# Patient Record
Sex: Female | Born: 1980 | Race: Black or African American | Hispanic: No | Marital: Single | Smoking: Never smoker
Health system: Southern US, Community
[De-identification: ages and names within clinical notes are randomized; demographics above are authoritative.]

## PROBLEM LIST (undated history)

## (undated) DIAGNOSIS — E559 Vitamin D deficiency, unspecified: Secondary | ICD-10-CM

## (undated) DIAGNOSIS — D649 Anemia, unspecified: Secondary | ICD-10-CM

## (undated) DIAGNOSIS — M329 Systemic lupus erythematosus, unspecified: Secondary | ICD-10-CM

## (undated) DIAGNOSIS — IMO0002 Reserved for concepts with insufficient information to code with codable children: Secondary | ICD-10-CM

## (undated) DIAGNOSIS — M797 Fibromyalgia: Secondary | ICD-10-CM

## (undated) DIAGNOSIS — G43909 Migraine, unspecified, not intractable, without status migrainosus: Secondary | ICD-10-CM

## (undated) DIAGNOSIS — M1990 Unspecified osteoarthritis, unspecified site: Secondary | ICD-10-CM

## (undated) DIAGNOSIS — I10 Essential (primary) hypertension: Secondary | ICD-10-CM

## (undated) DIAGNOSIS — G629 Polyneuropathy, unspecified: Secondary | ICD-10-CM

## (undated) HISTORY — PX: TUBAL LIGATION: SHX77

## (undated) HISTORY — DX: Migraine, unspecified, not intractable, without status migrainosus: G43.909

## (undated) HISTORY — PX: DILATION AND CURETTAGE, DIAGNOSTIC / THERAPEUTIC: SUR384

## (undated) HISTORY — DX: Vitamin D deficiency, unspecified: E55.9

## (undated) HISTORY — PX: BARIATRIC SURGERY: SHX1103

---

## 2003-02-19 ENCOUNTER — Encounter: Admission: RE | Admit: 2003-02-19 | Discharge: 2003-02-19 | Payer: Self-pay | Admitting: Obstetrics and Gynecology

## 2003-07-14 ENCOUNTER — Encounter: Admission: RE | Admit: 2003-07-14 | Discharge: 2003-07-14 | Payer: Self-pay | Admitting: Obstetrics and Gynecology

## 2003-07-20 ENCOUNTER — Ambulatory Visit (HOSPITAL_COMMUNITY): Admission: RE | Admit: 2003-07-20 | Discharge: 2003-07-20 | Payer: Self-pay | Admitting: *Deleted

## 2003-09-09 ENCOUNTER — Emergency Department (HOSPITAL_COMMUNITY): Admission: EM | Admit: 2003-09-09 | Discharge: 2003-09-09 | Payer: Self-pay | Admitting: Emergency Medicine

## 2004-01-06 ENCOUNTER — Ambulatory Visit (HOSPITAL_COMMUNITY): Admission: AD | Admit: 2004-01-06 | Discharge: 2004-01-07 | Payer: Self-pay | Admitting: Obstetrics and Gynecology

## 2004-01-07 ENCOUNTER — Encounter (INDEPENDENT_AMBULATORY_CARE_PROVIDER_SITE_OTHER): Payer: Self-pay | Admitting: *Deleted

## 2004-02-10 ENCOUNTER — Inpatient Hospital Stay (HOSPITAL_COMMUNITY): Admission: AD | Admit: 2004-02-10 | Discharge: 2004-02-10 | Payer: Self-pay | Admitting: Obstetrics & Gynecology

## 2004-02-11 ENCOUNTER — Ambulatory Visit: Payer: Self-pay | Admitting: Obstetrics and Gynecology

## 2004-10-26 ENCOUNTER — Emergency Department: Payer: Self-pay | Admitting: Unknown Physician Specialty

## 2005-05-18 ENCOUNTER — Ambulatory Visit: Payer: Self-pay | Admitting: Obstetrics and Gynecology

## 2005-05-18 ENCOUNTER — Encounter: Payer: Self-pay | Admitting: Obstetrics and Gynecology

## 2005-08-28 ENCOUNTER — Emergency Department: Payer: Self-pay | Admitting: Emergency Medicine

## 2006-01-05 ENCOUNTER — Emergency Department: Payer: Self-pay | Admitting: Emergency Medicine

## 2006-02-21 ENCOUNTER — Ambulatory Visit: Payer: Self-pay | Admitting: Obstetrics & Gynecology

## 2007-03-06 ENCOUNTER — Emergency Department: Payer: Self-pay | Admitting: Emergency Medicine

## 2007-04-18 ENCOUNTER — Ambulatory Visit: Payer: Self-pay | Admitting: Family Medicine

## 2007-04-18 ENCOUNTER — Encounter: Payer: Self-pay | Admitting: Physician Assistant

## 2007-05-22 ENCOUNTER — Ambulatory Visit: Payer: Self-pay | Admitting: Family Medicine

## 2007-06-12 ENCOUNTER — Emergency Department: Payer: Self-pay | Admitting: Emergency Medicine

## 2008-02-22 ENCOUNTER — Emergency Department (HOSPITAL_BASED_OUTPATIENT_CLINIC_OR_DEPARTMENT_OTHER): Admission: EM | Admit: 2008-02-22 | Discharge: 2008-02-22 | Payer: Self-pay | Admitting: Emergency Medicine

## 2008-04-20 ENCOUNTER — Emergency Department (HOSPITAL_COMMUNITY): Admission: EM | Admit: 2008-04-20 | Discharge: 2008-04-20 | Payer: Self-pay | Admitting: Emergency Medicine

## 2010-02-22 ENCOUNTER — Emergency Department: Payer: Self-pay | Admitting: Emergency Medicine

## 2010-05-20 NOTE — Op Note (Signed)
Connie Whitney, Connie Whitney             ACCOUNT NO.:  [id number]   MEDICAL RECORD NO.:  [id number]          PATIENT TYPE:  MAT   LOCATION:  [facility]                          FACILITY:  [facility]   PHYSICIAN:  Malva Limes, M.D.    DATE OF BIRTH:  21-Mar-1980   DATE OF PROCEDURE:  01/07/2004  DATE OF DISCHARGE:                                 OPERATIVE REPORT   PREOPERATIVE DIAGNOSIS:  Incomplete abortion.   POSTOPERATIVE DIAGNOSIS:  Incomplete abortion.   OPERATION/PROCEDURE:  Dilatation and curettage.   SURGEON:  Malva Limes, M.D.   ANESTHESIA:  MAC with paracervical block.   ANTIBIOTICS:  Ancef 1 g.   DRAINS:  None.   SPECIMENS:  Products of conception sent to pathology.   COMPLICATIONS:  None.   DESCRIPTION OF PROCEDURE:  The patient was taken to the operating room where  she was placed on the dorsal lithotomy position.  She was prepped with  Hibiclens.  She was draped in the usual fashion for the procedure and 20 mL  of 1% lidocaine was used for paracervical block.  The cervix was grasped  with a single-tooth tenaculum.  The os was dilated to a 29-French.  An 8 mm  suction cannula was then placed into the uterine cavity.  Products of  conception were then withdrawn.  Sharp curettage was then performed followed  by repeat suction.  The patient tolerated the procedure well and she was  taken to the recovery room in stable condition.  Blood type was RH positive  and, therefore, no RhoGAM was indicated.  The patient will be discharged to  home with Keflex 500 mg q.i.d. for two days and Anaprox double strength  p.r.n.  She will follow up in the office in four weeks.     Mark   MA/MEDQ  D:  01/07/2004  T:  01/07/2004  Job:  [id number]

## 2010-05-20 NOTE — Group Therapy Note (Signed)
NAMEPHILENA, OBEY             ACCOUNT NO.:  [id number]   MEDICAL RECORD NO.:  [id number]          PATIENT TYPE:  WOC   LOCATION:  [facility]                   FACILITY:  [facility]   PHYSICIAN:  Argentina Donovan, MD        DATE OF BIRTH:  06-Aug-1980   DATE OF SERVICE:                                    CLINIC NOTE   This patient is a 30 year old, African-American hypertensive nulligravida  female who weighs 258 and is 5 feet 8 inches tall, who came in requesting an  IUD.  We discussed both types of IUDs, and which she wanted of these, and  she has chosen the Mirena because of the shorter periods and the lack of  cramping.  She is a hypertensive, with a blood pressure today of 160/109.  She is on hydrochlorothiazide, which she has not taken in a month because  her prescription ran out.  Pap smear was taken today.  After going over the  possible complications related to the IUD, the patient was placed in the  dorsal lithotomy position.  A Graves speculum was placed in the vagina.  The  cervix was easily visualized.  A Pap smear was taken.  The uterine cavity  which was anterior was sounded to 8 cm.  The Mirena IUD was inserted without  incident.  The patient has been instructed to come back if has any  significant bleeding or if the string appears to be too long and bothering  her.  She is also going to check for the first couple months if she feels  the string after her period.  The uterus was of normal size, shape, and  consistency.  Adnexa could not be palpated.  External genitalia was normal.  BUS was within normal limits.  The vagina is clean and well rugated.  The  cervix is clean, nulliparous.           ______________________________  Argentina Donovan, MD     PR/MEDQ  D:  05/18/2005  T:  05/19/2005  Job:  [id number]

## 2010-05-20 NOTE — Group Therapy Note (Signed)
NAMEHAYVEN, Connie Whitney                         ACCOUNT NO.:  [id number]   MEDICAL RECORD NO.:  [id number]                   PATIENT TYPE:  OUT   LOCATION:  [facility]                           FACILITY:  [facility]   PHYSICIAN:  Argentina Donovan, MD                     DATE OF BIRTH:  January 11, 1980   DATE OF SERVICE:  07/14/2003                                    CLINIC NOTE   REASON FOR VISIT:  This is a 30 year old nulligravida black female who was  seen in February because of polymenorrhea, had been on Depro-Provera for one-  and-a-half years up until then, and had irregular menses through the entire  time.  Since she had stopped it she was continuing to have dysfunctional  bleeding and was placed on a generic Ortho-Novum 1/35.  Over the past few  months she has been taking it continually and avoiding the placebo pills at  the end and has spotted or bled every day so that she uses between one and  three pads each day.  She also complains of sharp pains in the right lower  quadrant which come and go.  We are unable to evaluate the pelvis very well  because the patient does weigh 258 pounds.  We are going to go ahead and get  an ultrasound to check the right adnexal pain and also to evaluate the  uterine stripe, then I think a decision will come on whether we need to do a  D&C on this patient or have her cycle on the Ortho-Novum 1/50.  In addition,  the patient today has a slightly elevated blood pressure of 155/92.  It was  also elevated last time so we are going to send her over to the medical  clinic for control of blood pressure.   IMPRESSION:  1. Dysfunctional uterine bleeding, seemingly intractable at this point.  2. Hypertension.                                               Argentina Donovan, MD    PR/MEDQ  D:  07/14/2003  T:  07/14/2003  Job:  [id number]

## 2010-05-20 NOTE — Group Therapy Note (Signed)
Connie Whitney, Connie Whitney             ACCOUNT NO.:  [id number]   MEDICAL RECORD NO.:  [id number]          PATIENT TYPE:  WOC   LOCATION:  [facility]                   FACILITY:  [facility]   PHYSICIAN:  Argentina Donovan, MD        DATE OF BIRTH:  1980-10-25   DATE OF SERVICE:  02/11/2004                                    CLINIC NOTE   The patient is a 30 year old black female who was on oral contraceptives for  dysfunctional bleeding.  She weighs 259 pounds.  She was on Ortho-Novum 1/50  and at the end of last year she fell down the stairs and broke both her  legs.  She was on antibiotics as well as medications which may have  interfered, although she says she took a pill every day and somehow got  pregnant around November of 2005.  She miscarried in January of 2005 and had  a D&C for an incomplete abortion.  She comes in today with her first period  since the [facility] for her annual examination.  She is, right now, seeming in  good health, although her blood pressure is up.  She stopped taking her  hydrochlorothiazide which we will restart.  We must control her  dysfunctional bleeding.  We will try her on __________  and see if that  works for her.  If not, we may have to revert back to the Ortho-Novum 1/50.  Meanwhile, we will put her back on her hydrochlorothiazide and __________  her blood pressure.   IMPRESSION:  1.  Morbid obesity.  2.  Hypertension.  3.  Dysfunctional uterine bleeding.      PR/MEDQ  D:  02/11/2004  T:  02/12/2004  Job:  [id number]

## 2010-05-20 NOTE — Group Therapy Note (Signed)
Connie Whitney, BORA             ACCOUNT NO.:  [id number]   MEDICAL RECORD NO.:  [id number]          PATIENT TYPE:  WOC   LOCATION:  [facility]                   FACILITY:  [facility]   PHYSICIAN:  Dorthula Perfect, MD     DATE OF BIRTH:  09-May-1980   DATE OF SERVICE:  02/21/2006                                  CLINIC NOTE   A 30 year old black female, non gravida, is here for an IUD removal.  She had been seen here for the past few years.  Her initial blood  pressure back in 2005 was 155/92.  She has been on birth control pills  to help with dysfunctional bleeding.  Her blood pressure has remained  basically the same.  The birth control pills were discontinued about 10  months ago, and a Mirena IUD was inserted.  Blood pressure has continued  to remain in the same area.  She is followed in the medical clinic, now  on medication, and the medical doctors wonder if the IUD should be  removed, as it might be contributing to her blood pressure.   The patient has vaginal spotting almost every day since the IUD was  inserted, and states that this has been going on pretty much for the  past year.  For that reason, she wants the IUD removed.  She is  interested in the NuvaRing.   The patient states she did not eat very much.  She also still drinks  regular sodas, eats potatoes, etc.  There is a strong family history of  hypertension.   PHYSICAL EXAMINATION:  Height 5 feet 8 inches, weight 260.  Blood  pressure 158/108.  ABDOMEN:  Obese, soft, nontender, no masses.  PELVIC EXAM:  External genitalia, BUS glands are normal, vaginal vault  __________ , as was cervix.  At the conclusion of the exam, the IUD was  removed.  On bimanual exam, the uterus was of normal size and shape.  Adnexal structures were normal.   IMPRESSION:  1. Hypertension.  2. Breakthrough bleeding, secondary to the intrauterine device.   DISPOSITION:  1. Intrauterine device is removed.  2. NuvaRing, with a prescription  for a year.  3. The patient is advised to switch to unsweetened drinks and avoid      the bad starches such as potatoes in any form, etc.   If she continues to have irregular bleeding after using the NuvaRing for  3 months, then she needs to return for further evaluation.          ______________________________  Dorthula Perfect, MD    ER/MEDQ  D:  02/21/2006  T:  02/21/2006  Job:  [id number]

## 2010-05-20 NOTE — Group Therapy Note (Signed)
NAMEARLISHA, Whitney                         ACCOUNT NO.:  [id number]   MEDICAL RECORD NO.:  [id number]                   PATIENT TYPE:  OUT   LOCATION:  [facility]                           FACILITY:  [facility]   PHYSICIAN:  Argentina Donovan, MD                     DATE OF BIRTH:  24-Jul-1980   DATE OF SERVICE:  02/19/2003                                    CLINIC NOTE   CHIEF COMPLAINT:  Abnormal bleeding.   SUBJECTIVE:  Connie Whitney is a 30 year old G0 who is here today for  polymenorrhea.  She states she had been on Depo-Provera for the last one-and-  a-half years.  She had had irregular menses throughout the entire time.  Her  last Depo injection was supposed to be on October 18 and she did not make  that appointment because she no longer wanted to be on Depo.  Since then she  has had bleeding every day.  She states the bleeding is light and uses  between one to four pads per day.  No orthostasis or dizziness.  During the  time that she was taking the Depo she had been tried on estrogen for a few  months which caused her bleeding to cease.  She also has been on OCPs in the  past and started at age 24.  Some of them caused her to be nauseated and  some physicians were concerned with her blood pressure being elevated and  took her off of the pills.   Elevated blood pressure:  She had been on diuretics for about 6 months and  she has now been off for about a year.  She was taken off of them initially  because it was thought that her hypertension was due to stress at work.  She  was taken off when she quit that job.  She currently is working for [employer].  She denies any chest pain or vision changes.  She states her  cholesterol was checked in the past and is normal.  She is not a smoker.   GYNECOLOGICAL HISTORY:  She had abnormal Pap smear in July 2004 and never  underwent colposcopy secondary to her bleeding.  She states that ever since  she began obtaining Pap smears they have  been abnormal.  She has never been  biopsied in the past.  She is currently sexually active with three partners  and uses condoms.   SOCIAL HISTORY:  As above.   FAMILY HISTORY:  Her father has hypertension and a heart murmur that is  benign.   ALLERGIES:  No known drug allergies.   OBJECTIVE:  Vital signs noted.  Today her blood pressure is 151/104.  In  general she is an obese female in no acute distress.  Skin exam shows no  hirsutism.  Pelvic exam reveals normal external female genitalia.  There is  blood at the introitus.  Vaginal walls  appear normal.  Cervix is within  normal limits and blood at the os.  There is no noted discharge.  Bimanual  exam reveals nonpregnant-size uterus with palpable adnexa bilaterally  although the exam is limited secondary to body habitus.   ASSESSMENT AND PLAN:  1. Polymenorrhea.  I suspect this patient is probably polycystic ovarian     syndrome.  I have checked the appropriate labs as well as a CBG to take     note of her blood glucose today.  I have placed her on Provera 10 mg     daily for 2 weeks and then transition to Ortho-Novum 1/35 one p.o. daily     as directed.  I have given her p.r.n. refills on the Ortho-Novum.  I told     her we will call her when her laboratory results are back if they are     abnormal.  2. Hypertension.  Since her blood pressure is elevated and she had been on     diuretics in the past I have placed her again on hydrochlorothiazide 25     mg daily with p.r.n. refills.  I checked a BMP today to take note of her     potassium and creatinine.  3. History of abnormal Pap smear.  A Pap smear has been done today.  If     abnormal she will need colposcopy.     Maurice March, MD                          Argentina Donovan, MD    LC/MEDQ  D:  02/19/2003  T:  02/20/2003  Job:  [id number]

## 2010-12-21 ENCOUNTER — Emergency Department: Payer: Self-pay | Admitting: Emergency Medicine

## 2011-10-08 ENCOUNTER — Emergency Department: Payer: Self-pay | Admitting: Unknown Physician Specialty

## 2011-10-08 LAB — CBC
HGB: 9.8 g/dL — ABNORMAL LOW (ref 12.0–16.0)
MCHC: 33.4 g/dL (ref 32.0–36.0)
Platelet: 366 10*3/uL (ref 150–440)
RDW: 15.7 % — ABNORMAL HIGH (ref 11.5–14.5)

## 2011-10-08 LAB — COMPREHENSIVE METABOLIC PANEL
Albumin: 3 g/dL — ABNORMAL LOW (ref 3.4–5.0)
Alkaline Phosphatase: 100 U/L (ref 50–136)
Anion Gap: 13 (ref 7–16)
BUN: 13 mg/dL (ref 7–18)
EGFR (African American): >60
Glucose: 61 mg/dL — ABNORMAL LOW (ref 65–99)
SGOT(AST): 10 U/L — ABNORMAL LOW (ref 15–37)

## 2011-10-08 LAB — URINALYSIS, COMPLETE
Blood: NEGATIVE
Nitrite: NEGATIVE
Protein: 75
Specific Gravity: 1.025 (ref 1.003–1.030)

## 2011-10-08 LAB — MAGNESIUM: Magnesium: 1.2 mg/dL — ABNORMAL LOW

## 2011-11-25 ENCOUNTER — Observation Stay: Payer: Self-pay | Admitting: Internal Medicine

## 2011-11-25 LAB — URINALYSIS, COMPLETE
Bacteria: NONE SEEN
Bilirubin,UR: NEGATIVE
Glucose,UR: NEGATIVE mg/dL (ref 0–75)
Ketone: NEGATIVE
Protein: NEGATIVE
RBC,UR: 1 /HPF (ref 0–5)
Specific Gravity: 1.017 (ref 1.003–1.030)
Squamous Epithelial: 4
WBC UR: 2 /HPF (ref 0–5)

## 2011-11-26 LAB — PIH PROFILE
Anion Gap: 12 (ref 7–16)
BUN: 9 mg/dL (ref 7–18)
Chloride: 106 mmol/L (ref 98–107)
Creatinine: 0.69 mg/dL (ref 0.60–1.30)
EGFR (Non-African Amer.): >60
Glucose: 99 mg/dL (ref 65–99)
HGB: 9.9 g/dL — ABNORMAL LOW (ref 12.0–16.0)
MCH: 26.5 pg (ref 26.0–34.0)
MCV: 79 fL — ABNORMAL LOW (ref 80–100)
Osmolality: 274 (ref 275–301)
Potassium: 3.1 mmol/L — ABNORMAL LOW (ref 3.5–5.1)
RBC: 3.72 10*6/uL — ABNORMAL LOW (ref 3.80–5.20)
WBC: 9.3 10*3/uL (ref 3.6–11.0)

## 2013-03-23 ENCOUNTER — Emergency Department: Payer: Self-pay | Admitting: Emergency Medicine

## 2013-03-23 LAB — CBC WITH DIFFERENTIAL/PLATELET
BASOS ABS: 0 10*3/uL (ref 0.0–0.1)
BASOS PCT: 0.2 %
EOS ABS: 0.1 10*3/uL (ref 0.0–0.7)
Eosinophil %: 0.9 %
HCT: 30.3 % — AB (ref 35.0–47.0)
HGB: 9.3 g/dL — ABNORMAL LOW (ref 12.0–16.0)
LYMPHS ABS: 2.6 10*3/uL (ref 1.0–3.6)
Lymphocyte %: 35.7 %
MCH: 21.1 pg — AB (ref 26.0–34.0)
MCHC: 30.8 g/dL — ABNORMAL LOW (ref 32.0–36.0)
MCV: 68 fL — AB (ref 80–100)
MONO ABS: 0.4 x10 3/mm (ref 0.2–0.9)
Monocyte %: 5.2 %
Neutrophil #: 4.2 10*3/uL (ref 1.4–6.5)
Neutrophil %: 58 %
Platelet: 520 10*3/uL — ABNORMAL HIGH (ref 150–440)
RBC: 4.43 10*6/uL (ref 3.80–5.20)
RDW: 17.8 % — ABNORMAL HIGH (ref 11.5–14.5)
WBC: 7.3 10*3/uL (ref 3.6–11.0)

## 2013-03-23 LAB — URINALYSIS, COMPLETE
BILIRUBIN, UR: NEGATIVE
BLOOD: NEGATIVE
Glucose,UR: NEGATIVE mg/dL (ref 0–75)
Ketone: NEGATIVE
NITRITE: NEGATIVE
PROTEIN: NEGATIVE
Ph: 6 (ref 4.5–8.0)
RBC,UR: 1 /HPF (ref 0–5)
SPECIFIC GRAVITY: 1.011 (ref 1.003–1.030)

## 2013-03-23 LAB — BASIC METABOLIC PANEL
Anion Gap: 5 — ABNORMAL LOW (ref 7–16)
BUN: 12 mg/dL (ref 7–18)
CALCIUM: 8.7 mg/dL (ref 8.5–10.1)
CHLORIDE: 105 mmol/L (ref 98–107)
CREATININE: 0.96 mg/dL (ref 0.60–1.30)
Co2: 26 mmol/L (ref 21–32)
EGFR (African American): >60
EGFR (Non-African Amer.): >60
GLUCOSE: 97 mg/dL (ref 65–99)
OSMOLALITY: 272 (ref 275–301)
POTASSIUM: 3 mmol/L — AB (ref 3.5–5.1)
SODIUM: 136 mmol/L (ref 136–145)

## 2013-03-23 LAB — TROPONIN I: Troponin-I: <0.02 ng/mL

## 2013-07-01 ENCOUNTER — Emergency Department (HOSPITAL_COMMUNITY)
Admission: EM | Admit: 2013-07-01 | Discharge: 2013-07-01 | Disposition: A | Discharge status: Home / Self Care | Payer: No Typology Code available for payment source | Attending: Emergency Medicine | Admitting: Emergency Medicine

## 2013-07-01 ENCOUNTER — Encounter (HOSPITAL_COMMUNITY): Payer: Self-pay | Admitting: Emergency Medicine

## 2013-07-01 DIAGNOSIS — S39012A Strain of muscle, fascia and tendon of lower back, initial encounter: Secondary | ICD-10-CM

## 2013-07-01 DIAGNOSIS — S335XXA Sprain of ligaments of lumbar spine, initial encounter: Secondary | ICD-10-CM | POA: Insufficient documentation

## 2013-07-01 DIAGNOSIS — Y9289 Other specified places as the place of occurrence of the external cause: Secondary | ICD-10-CM | POA: Insufficient documentation

## 2013-07-01 DIAGNOSIS — Y9389 Activity, other specified: Secondary | ICD-10-CM | POA: Insufficient documentation

## 2013-07-01 DIAGNOSIS — X58XXXA Exposure to other specified factors, initial encounter: Secondary | ICD-10-CM | POA: Insufficient documentation

## 2013-07-01 MED ORDER — IBUPROFEN 400 MG PO TABS
800.0000 mg | ORAL_TABLET | Freq: Once | ORAL | Status: AC
  Administered 2013-07-01: 800 mg via ORAL
  Filled 2013-07-01: qty 2

## 2013-07-01 MED ORDER — CYCLOBENZAPRINE HCL 10 MG PO TABS: 10.0000 mg | ORAL_TABLET | Freq: Three times a day (TID) | ORAL | Status: DC | PRN

## 2013-07-01 MED ORDER — IBUPROFEN 800 MG PO TABS: 800.0000 mg | ORAL_TABLET | Freq: Three times a day (TID) | ORAL | Status: DC | PRN

## 2013-07-01 NOTE — ED Notes (Addendum)
Pt trying to get out of the way of a large falling mirror, twisted neck and back. C/o neck and low back pain. Onset this am. Pt is driving herself today.

## 2013-07-01 NOTE — ED Provider Notes (Signed)
CSN: [id number]     Arrival date & time 07/01/13  0956 History  This chart was scribed for non-physician practitioner, Trixie DredgeEmily West, PA-C, working with Ethelda ChickMartha K Linker, MD by Shari HeritageAisha Amuda, ED Scribe. This patient was seen in room TR08C/TR08C and the patient's care was started at 11:23 AM.   Chief Complaint  Patient presents with  . Back Pain     The history is provided by the patient. No language interpreter was used.    HPI Comments: Connie Spariffany C Whitney is a 33 y.o. female who presents to the Emergency Department complaining of left lower back pain and spasms resulting from an injury that occurred about 1 hour prior to arrival. She rates pain as 6-8/10. Pain radiates into her upper legs. Patient states that she was brushing her teeth when a bracket broke and a large 50-60 lb mirror fell down from the wall. Patient further explains that she tried to catch the mirror rather than letting it hit her in the face, and she twisted her back. She states that she started having pain about 30 minutes after the incident. She denies other injuries, fever, abdominal pain, vomiting, bladder incontinence, bowel incontinence, dysuria, or difficulty urinating. She has no chronic medical conditions.  PCP - Ulanda EdisonEmma Williams  History reviewed. No pertinent past medical history. History reviewed. No pertinent past surgical history. No family history on file. History  Substance Use Topics  . Smoking status: Never Smoker   . Smokeless tobacco: Not on file  . Alcohol Use: No   OB History   Grav Para Term Preterm Abortions TAB SAB Ect Mult Living                 Review of Systems  Constitutional: Negative for fever.  Gastrointestinal: Negative for abdominal pain.  Musculoskeletal: Positive for back pain.  All other systems reviewed and are negative.     Allergies  Review of patient's allergies indicates no known allergies.  Home Medications   Prior to Admission medications   Not on File   Triage Vitals:  BP 182/116  Pulse 91  Temp(Src) 99.5 F (37.5 C) (Oral)  Resp 18  Wt 230 lb (104.327 kg)  SpO2 99%  LMP 06/15/2013  Physical Exam  Nursing note and vitals reviewed. Constitutional: She appears well-developed and well-nourished. No distress.  HENT:  Head: Normocephalic and atraumatic.  Neck: Neck supple.  Pulmonary/Chest: Effort normal.  Abdominal: Soft. There is no tenderness. There is no rebound and no guarding.  Musculoskeletal:       Lumbar back: She exhibits tenderness.       Arms: Left lower back tenderness. Spine nontender, no crepitus, or stepoffs. Lower extremities:  Strength 5/5, sensation intact, distal pulses intact.  Neurological: She is alert. Gait normal.  Skin: She is not diaphoretic.    ED Course  Procedures (including critical care time) DIAGNOSTIC STUDIES: Oxygen Saturation is 99% on room air, normal by my interpretation.    COORDINATION OF CARE: 11:27 AM- Patient informed of current plan for treatment and evaluation and agrees with plan at this time.    MDM   Final diagnoses:  Lumbar strain, initial encounter    Afebrile, nontoxic patient with mechanical low back pain. No red flags.  Discussed findings, treatment, and follow up  with patient.  Pt given return precautions.  Pt verbalizes understanding and agrees with plan.       I personally performed the services described in this documentation, which was scribed in my presence. The  recorded information has been reviewed and is accurate.   Trixie Dredgemily West, PA-C 07/01/13 1209

## 2013-07-01 NOTE — ED Provider Notes (Signed)
Medical screening examination/treatment/procedure(s) were performed by non-physician practitioner and as supervising physician I was immediately available for consultation/collaboration.   EKG Interpretation None       Martha K Linker, MD 07/01/13 1225 

## 2013-07-01 NOTE — ED Notes (Signed)
Pt presents to department for evaluation of back pain. States she was trying to prevent mirror from fall off wall this morning when she "twisted" her back and felt something pop. Now states 7/10 pain to back. Pt is alert and oriented x4. Ambulatory to triage. NAD.

## 2013-07-01 NOTE — Discharge Instructions (Signed)
Read the information below.  Use the prescribed medication as directed.  Please discuss all new medications with your pharmacist.  You may return to the Emergency Department at any time for worsening condition or any new symptoms that concern you.   If you develop fevers, loss of control of bowel or bladder, weakness or numbness in your legs, or are unable to walk, return to the ER for a recheck.    Back Pain, Adult Low back pain is very common. About 1 in 5 people have back pain.The cause of low back pain is rarely dangerous. The pain often gets better over time.About half of people with a sudden onset of back pain feel better in just 2 weeks. About 8 in 10 people feel better by 6 weeks.  CAUSES Some common causes of back pain include:  Strain of the muscles or ligaments supporting the spine.  Wear and tear (degeneration) of the spinal discs.  Arthritis.  Direct injury to the back. DIAGNOSIS Most of the time, the direct cause of low back pain is not known.However, back pain can be treated effectively even when the exact cause of the pain is unknown.Answering your caregiver's questions about your overall health and symptoms is one of the most accurate ways to make sure the cause of your pain is not dangerous. If your caregiver needs more information, he or she may order lab work or imaging tests (X-rays or MRIs).However, even if imaging tests show changes in your back, this usually does not require surgery. HOME CARE INSTRUCTIONS For many people, back pain returns.Since low back pain is rarely dangerous, it is often a condition that people can learn to [facility] their own.   Remain active. It is stressful on the back to sit or stand in one place. Do not sit, drive, or stand in one place for more than 30 minutes at a time. Take short walks on level surfaces as soon as pain allows.Try to increase the length of time you walk each day.  Do not stay in bed.Resting more than 1 or 2 days can  delay your recovery.  Do not avoid exercise or work.Your body is made to move.It is not dangerous to be active, even though your back may hurt.Your back will likely heal faster if you return to being active before your pain is gone.  Pay attention to your body when you bend and lift. Many people have less discomfortwhen lifting if they bend their knees, keep the load close to their bodies,and avoid twisting. Often, the most comfortable positions are those that put less stress on your recovering back.  Find a comfortable position to sleep. Use a firm mattress and lie on your side with your knees slightly bent. If you lie on your back, put a pillow under your knees.  Only take over-the-counter or prescription medicines as directed by your caregiver. Over-the-counter medicines to reduce pain and inflammation are often the most helpful.Your caregiver may prescribe muscle relaxant drugs.These medicines help dull your pain so you can more quickly return to your normal activities and healthy exercise.  Put ice on the injured area.  Put ice in a plastic bag.  Place a towel between your skin and the bag.  Leave the ice on for 15-20 minutes, 03-04 times a day for the first 2 to 3 days. After that, ice and heat may be alternated to reduce pain and spasms.  Ask your caregiver about trying back exercises and gentle massage. This may be of some  benefit.  Avoid feeling anxious or stressed.Stress increases muscle tension and can worsen back pain.It is important to recognize when you are anxious or stressed and learn ways to manage it.Exercise is a great option. SEEK MEDICAL CARE IF:  You have pain that is not relieved with rest or medicine.  You have pain that does not improve in 1 week.  You have new symptoms.  You are generally not feeling well. SEEK IMMEDIATE MEDICAL CARE IF:   You have pain that radiates from your back into your legs.  You develop new bowel or bladder control  problems.  You have unusual weakness or numbness in your arms or legs.  You develop nausea or vomiting.  You develop abdominal pain.  You feel faint. Document Released: 12/19/2004 Document Revised: 06/20/2011 Document Reviewed: 05/09/2010 [facility] Patient Information 2015 [place], [place]. This information is not intended to replace advice given to you by your health care provider. Make sure you discuss any questions you have with your health care provider.  Muscle Strain A muscle strain is an injury that occurs when a muscle is stretched beyond its normal length. Usually a small number of muscle fibers are torn when this happens. Muscle strain is rated in degrees. First-degree strains have the least amount of muscle fiber tearing and pain. Second-degree and third-degree strains have increasingly more tearing and pain.  Usually, recovery from muscle strain takes 1-2 weeks. Complete healing takes 5-6 weeks.  CAUSES  Muscle strain happens when a sudden, violent force placed on a muscle stretches it too far. This may occur with lifting, sports, or a fall.  RISK FACTORS Muscle strain is especially common in athletes.  SIGNS AND SYMPTOMS At the site of the muscle strain, there may be:  Pain.  Bruising.  Swelling.  Difficulty using the muscle due to pain or lack of normal function. DIAGNOSIS  Your health care provider will perform a physical exam and ask about your medical history. TREATMENT  Often, the best treatment for a muscle strain is resting, icing, and applying cold compresses to the injured area.  HOME CARE INSTRUCTIONS   Use the PRICE method of treatment to promote muscle healing during the first 2-3 days after your injury. The PRICE method involves:  Protecting the muscle from being injured again.  Restricting your activity and resting the injured body part.  Icing your injury. To do this, put ice in a plastic bag. Place a towel between your skin and the bag. Then, apply  the ice and leave it on from 15-20 minutes each hour. After the third day, switch to moist heat packs.  Apply compression to the injured area with a splint or elastic bandage. Be careful not to wrap it too tightly. This may interfere with blood circulation or increase swelling.  Elevate the injured body part above the level of your heart as often as you can.  Only take over-the-counter or prescription medicines for pain, discomfort, or fever as directed by your health care provider.  Warming up prior to exercise helps to prevent future muscle strains. SEEK MEDICAL CARE IF:   You have increasing pain or swelling in the injured area.  You have numbness, tingling, or a significant loss of strength in the injured area. MAKE SURE YOU:   Understand these instructions.  Will watch your condition.  Will get help right away if you are not doing well or get worse. Document Released: 12/19/2004 Document Revised: 10/09/2012 Document Reviewed: 07/18/2012 [facility] Patient Information 2015 [place], [place]. This  information is not intended to replace advice given to you by your health care provider. Make sure you discuss any questions you have with your health care provider. ° °

## 2013-11-24 ENCOUNTER — Emergency Department: Payer: Self-pay | Admitting: Emergency Medicine

## 2014-03-16 DIAGNOSIS — N920 Excessive and frequent menstruation with regular cycle: Secondary | ICD-10-CM | POA: Insufficient documentation

## 2014-04-14 ENCOUNTER — Emergency Department
Admit: 2014-04-14 | Disposition: A | Discharge status: Home / Self Care | Payer: Self-pay | Admitting: Emergency Medicine

## 2014-04-14 LAB — BASIC METABOLIC PANEL
Anion Gap: 10 (ref 7–16)
BUN: 23 mg/dL — ABNORMAL HIGH
CO2: 27 mmol/L
CREATININE: 1.47 mg/dL — AB
Calcium, Total: 8.6 mg/dL — ABNORMAL LOW
Chloride: 99 mmol/L — ABNORMAL LOW
EGFR (Non-African Amer.): 46 — ABNORMAL LOW
GFR CALC AF AMER: 53 — AB
GLUCOSE: 115 mg/dL — AB
POTASSIUM: 2.4 mmol/L — AB
Sodium: 136 mmol/L

## 2014-04-14 LAB — CBC WITH DIFFERENTIAL/PLATELET
BASOS PCT: 0.2 %
Basophil #: 0 10*3/uL (ref 0.0–0.1)
EOS ABS: 0.1 10*3/uL (ref 0.0–0.7)
EOS PCT: 1.3 %
HCT: 29 % — AB (ref 35.0–47.0)
HGB: 8.9 g/dL — AB (ref 12.0–16.0)
Lymphocyte #: 3.5 10*3/uL (ref 1.0–3.6)
Lymphocyte %: 36.1 %
MCH: 22.4 pg — AB (ref 26.0–34.0)
MCHC: 30.9 g/dL — AB (ref 32.0–36.0)
MCV: 73 fL — ABNORMAL LOW (ref 80–100)
MONO ABS: 0.6 x10 3/mm (ref 0.2–0.9)
Monocyte %: 6.1 %
NEUTROS ABS: 5.5 10*3/uL (ref 1.4–6.5)
Neutrophil %: 56.3 %
Platelet: 545 10*3/uL — ABNORMAL HIGH (ref 150–440)
RBC: 3.99 10*6/uL (ref 3.80–5.20)
RDW: 16.8 % — ABNORMAL HIGH (ref 11.5–14.5)
WBC: 9.8 10*3/uL (ref 3.6–11.0)

## 2014-04-15 LAB — URINALYSIS, COMPLETE
Bacteria: NONE SEEN
Bilirubin,UR: NEGATIVE
Glucose,UR: NEGATIVE mg/dL (ref 0–75)
KETONE: NEGATIVE
Nitrite: NEGATIVE
PH: 5 (ref 4.5–8.0)
SPECIFIC GRAVITY: 1.02 (ref 1.003–1.030)

## 2014-05-05 ENCOUNTER — Other Ambulatory Visit: Payer: Self-pay

## 2014-05-05 ENCOUNTER — Emergency Department
Admission: EM | Admit: 2014-05-05 | Discharge: 2014-05-06 | Disposition: A | Discharge status: Home / Self Care | Payer: Medicaid Other | Attending: Emergency Medicine | Admitting: Emergency Medicine

## 2014-05-05 ENCOUNTER — Emergency Department: Payer: Medicaid Other

## 2014-05-05 ENCOUNTER — Encounter: Payer: Self-pay | Admitting: Emergency Medicine

## 2014-05-05 DIAGNOSIS — J40 Bronchitis, not specified as acute or chronic: Secondary | ICD-10-CM | POA: Diagnosis not present

## 2014-05-05 DIAGNOSIS — R509 Fever, unspecified: Secondary | ICD-10-CM

## 2014-05-05 DIAGNOSIS — D500 Iron deficiency anemia secondary to blood loss (chronic): Secondary | ICD-10-CM | POA: Diagnosis not present

## 2014-05-05 DIAGNOSIS — M791 Myalgia, unspecified site: Secondary | ICD-10-CM

## 2014-05-05 DIAGNOSIS — E876 Hypokalemia: Secondary | ICD-10-CM

## 2014-05-05 DIAGNOSIS — A499 Bacterial infection, unspecified: Secondary | ICD-10-CM

## 2014-05-05 DIAGNOSIS — Z79899 Other long term (current) drug therapy: Secondary | ICD-10-CM | POA: Diagnosis not present

## 2014-05-05 DIAGNOSIS — N390 Urinary tract infection, site not specified: Secondary | ICD-10-CM | POA: Insufficient documentation

## 2014-05-05 DIAGNOSIS — R197 Diarrhea, unspecified: Secondary | ICD-10-CM | POA: Diagnosis not present

## 2014-05-05 HISTORY — DX: Systemic lupus erythematosus, unspecified: M32.9

## 2014-05-05 HISTORY — DX: Reserved for concepts with insufficient information to code with codable children: IMO0002

## 2014-05-05 LAB — CBC WITH DIFFERENTIAL/PLATELET
BASOS ABS: 0 10*3/uL (ref 0–0.1)
Eosinophils Absolute: 0 10*3/uL (ref 0–0.7)
HCT: 27.5 % — ABNORMAL LOW (ref 35.0–47.0)
HEMOGLOBIN: 9 g/dL — AB (ref 12.0–16.0)
LYMPHS ABS: 1 10*3/uL (ref 1.0–3.6)
MCH: 23.8 pg — ABNORMAL LOW (ref 26.0–34.0)
MCHC: 32.7 g/dL (ref 32.0–36.0)
MCV: 73 fL — ABNORMAL LOW (ref 80.0–100.0)
Monocytes Absolute: 0.4 10*3/uL (ref 0.2–0.9)
Monocytes Relative: 6 %
NEUTROS ABS: 4.7 10*3/uL (ref 1.4–6.5)
PLATELETS: 589 10*3/uL — AB (ref 150–440)
RBC: 3.77 MIL/uL — AB (ref 3.80–5.20)
RDW: 17.4 % — ABNORMAL HIGH (ref 11.5–14.5)
WBC: 6.1 10*3/uL (ref 3.6–11.0)

## 2014-05-05 LAB — URINALYSIS COMPLETE WITH MICROSCOPIC (ARMC ONLY)
Bilirubin Urine: NEGATIVE
Glucose, UA: NEGATIVE mg/dL
KETONES UR: NEGATIVE mg/dL
NITRITE: NEGATIVE
Protein, ur: NEGATIVE mg/dL
SPECIFIC GRAVITY, URINE: 1.019 (ref 1.005–1.030)
pH: 5 (ref 5.0–8.0)

## 2014-05-05 LAB — HEPATIC FUNCTION PANEL
ALBUMIN: 3.5 g/dL (ref 3.5–5.0)
ALT: 12 U/L — ABNORMAL LOW (ref 14–54)
AST: 20 U/L (ref 15–41)
Alkaline Phosphatase: 90 U/L (ref 38–126)
Bilirubin, Direct: <0.1 mg/dL — ABNORMAL LOW (ref 0.1–0.5)
TOTAL PROTEIN: 7.6 g/dL (ref 6.5–8.1)
Total Bilirubin: 0.3 mg/dL (ref 0.3–1.2)

## 2014-05-05 LAB — BASIC METABOLIC PANEL
ANION GAP: 10 (ref 5–15)
BUN: 17 mg/dL (ref 6–20)
CHLORIDE: 100 mmol/L — AB (ref 101–111)
CO2: 26 mmol/L (ref 22–32)
Calcium: 9.1 mg/dL (ref 8.9–10.3)
Creatinine, Ser: 1 mg/dL (ref 0.44–1.00)
GFR calc Af Amer: >60 mL/min (ref >60)
GFR calc non Af Amer: >60 mL/min (ref >60)
Glucose, Bld: 176 mg/dL — ABNORMAL HIGH (ref 65–99)
Potassium: 2.5 mmol/L — CL (ref 3.5–5.1)
SODIUM: 136 mmol/L (ref 135–145)

## 2014-05-05 LAB — MAGNESIUM: MAGNESIUM: 1.3 mg/dL — AB (ref 1.7–2.4)

## 2014-05-05 LAB — RAPID INFLUENZA A&B ANTIGENS (ARMC ONLY): INFLUENZA B (ARMC): NOT DETECTED

## 2014-05-05 LAB — LIPASE, BLOOD: LIPASE: 31 U/L (ref 22–51)

## 2014-05-05 LAB — RAPID INFLUENZA A&B ANTIGENS: Influenza A (ARMC): NOT DETECTED

## 2014-05-05 MED ORDER — POTASSIUM CHLORIDE CRYS ER 20 MEQ PO TBCR
EXTENDED_RELEASE_TABLET | ORAL | Status: AC
  Administered 2014-05-05: 40 meq via ORAL
  Filled 2014-05-05: qty 2

## 2014-05-05 MED ORDER — POTASSIUM CHLORIDE 2 MEQ/ML IV SOLN
Freq: Once | INTRAVENOUS | Status: AC
  Administered 2014-05-05: 23:00:00 via INTRAVENOUS
  Filled 2014-05-05: qty 100

## 2014-05-05 MED ORDER — DEXTROSE 5 % IV SOLN: Freq: Once | INTRAVENOUS | Status: DC

## 2014-05-05 MED ORDER — POTASSIUM CHLORIDE 10 MEQ/100ML IV SOLN: 10.0000 meq | Freq: Once | INTRAVENOUS | Status: DC

## 2014-05-05 MED ORDER — POTASSIUM CHLORIDE CRYS ER 20 MEQ PO TBCR
40.0000 meq | EXTENDED_RELEASE_TABLET | Freq: Once | ORAL | Status: AC
  Administered 2014-05-05: 40 meq via ORAL

## 2014-05-05 MED ORDER — ACETAMINOPHEN 500 MG PO TABS
ORAL_TABLET | ORAL | Status: AC
  Filled 2014-05-05: qty 2

## 2014-05-05 MED ORDER — POTASSIUM CHLORIDE 10 MEQ/100ML IV SOLN
10.0000 meq | Freq: Once | INTRAVENOUS | Status: DC
  Filled 2014-05-05: qty 100

## 2014-05-05 MED ORDER — SODIUM CHLORIDE 0.9 % IV BOLUS (SEPSIS)
1000.0000 mL | INTRAVENOUS | Status: AC
  Administered 2014-05-05: 1000 mL via INTRAVENOUS

## 2014-05-05 MED ORDER — ACETAMINOPHEN 500 MG PO TABS
1000.0000 mg | ORAL_TABLET | Freq: Once | ORAL | Status: AC
  Administered 2014-05-05: 1000 mg via ORAL

## 2014-05-05 NOTE — ED Notes (Signed)
Awaiting K IV from Pharmacy, will adm once arrived.

## 2014-05-05 NOTE — ED Notes (Signed)
NEG urine preg

## 2014-05-05 NOTE — ED Notes (Signed)
Called lab in reference to missing results for bloodwork, stated those orders were cancelled in error. MD made aware.

## 2014-05-05 NOTE — ED Notes (Signed)
Called pharm for K IV

## 2014-05-05 NOTE — ED Notes (Signed)
Pt reports that she has developed a fever and body aches today. She states that she has tested for Lupus but is unsure if this is a flare up or if she has something else going on

## 2014-05-05 NOTE — ED Notes (Signed)
Lab called and stated additional orders were not needed for bloodwork because they had already resulted. MD aware.

## 2014-05-05 NOTE — ED Provider Notes (Addendum)
[facility] Emergency Department Provider Note    ____________________________________________  Time seen: 21:20  I have reviewed the triage vital signs and the nursing notes.   HISTORY  Chief Complaint Fever       HPI Kherington Meraz is a 34 y.o. female with a history of a positive ANA and who is being worked up to determine if she has [employer] rheumatology who presents with fever, myalgias, and general malaise since last night. She had one episode of diarrhea last night and when she awoke this morning she had a fever to 101, her whole body hurts including her muscles in her joints, as she has continued to feel ill all day without any other specific complaints. She denies cough, congestion, sore throat, chest pain, shortness of breath, abdominal pain, dysuria, nausea, vomiting. She wonders whether she may have the flu or whether she is having a lupus flare. She has not had similar symptoms previously. She reports that she did receive a flu vaccination this year     Past Medical History  Diagnosis Date  . Lupus     There are no active problems to display for this patient.   Past Surgical History  Procedure Laterality Date  . Dilation and curettage, diagnostic / therapeutic      Current Outpatient Rx  Name  Route  Sig  Dispense  Refill  . amLODipine-benazepril (LOTREL) 5-40 MG per capsule   Oral   Take 1 capsule by mouth daily.         . carvedilol (COREG) 12.5 MG tablet   Oral   Take 12.5 mg by mouth 2 (two) times daily with a meal.         . cyclobenzaprine (FLEXERIL) 10 MG tablet   Oral   Take 1 tablet (10 mg total) by mouth 3 (three) times daily as needed for muscle spasms.   15 tablet   0   . gabapentin (NEURONTIN) 600 MG tablet   Oral   Take 600-900 mg by mouth 3 (three) times daily. Take 1 tablet ( ) in the morning, 1 tablet ( ) at noon, and 1.5 tablets ( ) at bedtime.         Marland Kitchen glipiZIDE (GLUCOTROL) 5  MG tablet   Oral   Take 5 mg by mouth at bedtime.         Marland Kitchen ibuprofen (ADVIL,MOTRIN) 800 MG tablet   Oral   Take 1 tablet (800 mg total) by mouth every 8 (eight) hours as needed for mild pain or moderate pain.   15 tablet   0   . IRON PO   Oral   Take 1 tablet by mouth 2 (two) times daily.         . metFORMIN (GLUCOPHAGE) 1000 MG tablet   Oral   Take 1,000 mg by mouth 2 (two) times daily with a meal.         . norethindrone (NOR-QD) 0.35 MG tablet   Oral   Take 1 tablet by mouth daily.           Allergies Review of patient's allergies indicates no known allergies.  No family history on file.  Social History History  Substance Use Topics  . Smoking status: Never Smoker   . Smokeless tobacco: Not on file  . Alcohol Use: No    Review of Systems  Constitutional: Positive for fever and myalgias Eyes: Negative for visual changes. ENT: Negative for sore throat. Cardiovascular: Negative for chest pain. Respiratory: Negative  for shortness of breath. Gastrointestinal: Negative for abdominal pain, vomiting. One episode of diarrhea Genitourinary: Negative for dysuria. Musculoskeletal: Negative for back pain. Skin: Negative for rash. Neurological: Negative for headaches, focal weakness or numbness.   10-point ROS otherwise negative.  ____________________________________________   PHYSICAL EXAM:  VITAL SIGNS: ED Triage Vitals  Enc Vitals Group     BP 05/05/14 1843 170/90 mmHg     Pulse Rate 05/05/14 1843 100     Resp --      Temp 05/05/14 1843 102.2 F (39 C)     Temp Source 05/05/14 1843 Oral     SpO2 05/05/14 1843 98 %     Weight 05/05/14 1843 247 lb (112.038 kg)     Height 05/05/14 1843 5\' 10"  (1.778 m)     Head Cir --      Peak Flow --      Pain Score 05/05/14 1844 8     Pain Loc --      Pain Edu? --      Excl. in GC? --      Constitutional: Alert and oriented but appears uncomfortable. Eyes: Conjunctivae are normal. PERRL. Normal  extraocular movements. ENT   Head: Normocephalic and atraumatic.   Nose: No congestion/rhinnorhea.   Mouth/Throat: Mucous membranes are moist. No pharyngeal erythema or exudate   Neck: No stridor. Hematological/Lymphatic/Immunilogical: No cervical lymphadenopathy. Cardiovascular: Mild tachycardia at about 100, regular rhythm. Normal and symmetric distal pulses are present in all extremities. No murmurs, rubs, or gallops. Respiratory: Normal respiratory effort without tachypnea nor retractions. Breath sounds are clear and equal bilaterally. No wheezes/rales/rhonchi. Gastrointestinal: Morbid obesity. Soft and nontender. No distention. No abdominal bruits. There is no CVA tenderness. Genitourinary: Deferred Musculoskeletal: Nontender with normal range of motion in all extremities. No joint effusions.  No lower extremity tenderness nor edema. Neurologic:  Normal speech and language. No gross focal neurologic deficits are appreciated. Speech is normal. No gait instability. Skin:  Skin is warm, dry and intact. No rash noted. Psychiatric: Mood and affect are normal. Speech and behavior are normal. Patient exhibits appropriate insight and judgment.  ____________________________________________    LABS (pertinent positives/negatives)  Notable for hemoglobin of 9 (chronic) and a potassium of 2.5, which is similar to but slightly lower than her usual potassium levels.  Her magnesium level was 1.3. Her influenza studies are negative. She has a mildly positive urinalysis.  ____________________________________________   EKG  ED ECG REPORT   Date: 05/05/2014  EKG Time: 21:33  Rate: 94  Rhythm: normal sinus rhythm, nonspecific ST and T waves changes  Axis: Normal  Intervals:none  ST&T Change: Nonspecific ST and T-wave changes including inverted T waves in leads 3, V2 to V6. No evidence of acute ischemia.   ____________________________________________    RADIOLOGY  Notable for  bronchitic changes without obvious infiltrate or consolidation  ____________________________________________   PROCEDURES  Procedure(s) performed: None  Critical Care performed: No  ____________________________________________   INITIAL IMPRESSION / ASSESSMENT AND PLAN / ED COURSE  Pertinent labs & imaging results that were available during my care of the patient were reviewed by me and considered in my medical decision making (see chart for details).  The patient appears uncomfortable but nontoxic. She has a fever of 102.2 in the emergency department with mild tachycardia. Her CBC was unremarkable and includes only chronic anemia. Her labs are notable for a low potassium at 2.5, but this is consistent with her prior lab values. She states that she always has low potassium  due to chronic vaginal bleeding, but this does not make sense medically.  Regardless, she has chronic anemia, but this is slightly lower than usual. We will replete her potassium with by mouth and IV potassium chloride, give her a 1 L normal saline fluid bolus, and evaluate for other sources of infection such as influenza, pneumonia, and UTI. We will reassess after our lab results are back and after the fluid bolus. The patient agrees with the plan.  ----------------------------------------- 12:24 AM on 05/06/2014 -----------------------------------------  The patient feels somewhat better after a liter of IV fluids. We are repleting her potassium and magnesium. I discussed results with her and we agreed that I would prescribe antibiotics for her urinary tract infection which should, in theory, also treat a possible lung infection (Cipro). I also sent blood cultures 2 to the lab. I am prescribing her Cipro, Klor-Con, and magnesium supplements.  She plans to follow-up with her rheumatologist tomorrow for the next available appointment. I gave her my usual and customary return  precautions.  ____________________________________________   FINAL CLINICAL IMPRESSION(S) / ED DIAGNOSES  Final diagnoses:  Fever in adult  Myalgia  Chronic hypokalemia  Chronic blood loss anemia  Hypomagnesemia  UTI (urinary tract infection), bacterial  Bronchitis     Loleta Rose, MD 05/06/14 0025  Loleta Rose, MD 05/06/14 [phone]

## 2014-05-05 NOTE — ED Notes (Signed)
MD called and spoke with lab to add on additional orders, they stated they would.

## 2014-05-06 MED ORDER — MAGNESIUM SULFATE 2 GM/50ML IV SOLN
INTRAVENOUS | Status: AC
Start: 2014-05-06 — End: 2014-05-06
  Administered 2014-05-06: 2 g via INTRAVENOUS
  Filled 2014-05-06: qty 50

## 2014-05-06 MED ORDER — CIPROFLOXACIN HCL 500 MG PO TABS
ORAL_TABLET | ORAL | Status: AC
Start: 2014-05-06 — End: 2014-05-06
  Administered 2014-05-06: 500 mg via ORAL
  Filled 2014-05-06: qty 1

## 2014-05-06 MED ORDER — POTASSIUM CHLORIDE 20 MEQ PO PACK: 20.0000 meq | PACK | Freq: Two times a day (BID) | ORAL | Status: DC

## 2014-05-06 MED ORDER — MAGNESIUM SULFATE 2 GM/50ML IV SOLN
2.0000 g | Freq: Once | INTRAVENOUS | Status: AC
  Administered 2014-05-06: 2 g via INTRAVENOUS

## 2014-05-06 MED ORDER — MAGNESIUM OXIDE 400 MG PO CAPS: 1.0000 | ORAL_CAPSULE | Freq: Every day | ORAL | Status: DC

## 2014-05-06 MED ORDER — CIPROFLOXACIN HCL 500 MG PO TABS
500.0000 mg | ORAL_TABLET | ORAL | Status: AC
  Administered 2014-05-06: 500 mg via ORAL

## 2014-05-06 MED ORDER — CIPROFLOXACIN HCL 500 MG PO TABS: 500.0000 mg | ORAL_TABLET | Freq: Two times a day (BID) | ORAL | Status: AC

## 2014-05-06 NOTE — Discharge Instructions (Signed)
As we discussed, urine workup in the emergency department today was notable for potassium that was even lower than usual, low magnesium, a mild urinary tract infection, and mild bronchitis. We gave you supplements of potassium and magnesium by IV and by mouth, as well as a first dose of antibiotic. Please fill your prescriptions and take the full course as prescribed. We recommend that you call your rheumatologist tomorrow to schedule the next available appointment to follow up on this emergency department visit. Continue to take over-the-counter ibuprofen and/or Tylenol as needed for fever and pain control. Return immediately to the emergency department if you develop new or worsening symptoms that concern you.

## 2014-05-06 NOTE — ED Notes (Signed)
Pt alert and in NAD at time of d/c 

## 2014-05-11 LAB — CULTURE, BLOOD (ROUTINE X 2)
Culture: NO GROWTH
Culture: NO GROWTH

## 2014-05-12 NOTE — H&P (Signed)
L&D Evaluation:  History:   HPI 34 year old G3P0 presents to L&D at 2323 3/7 weeks with c/o abd pain/possible ctx's. EDD 03/20/12 Pt has high risk pregnancy and [facility]  She reports her pain mainly when the baby moves, denies VB, LOF. Good FM Upon arrival BP was 200/100's, pt takes 600 mg Labetalol bid and diltiazem in the AM but is unsure of dosage.  She also has Type 2 diabetes and takes metformin and glyburide We have no PN records on her at this time, the only records [facility] was able to send us was [facility].    Presents with abdominal pain    Patient's Medical History Diabetes  Hypertension    Patient's Surgical History none    Medications Pre Natal Vitamins  metformin, glyburide, neurontin, trazadone, labetalol, diltiazem    Allergies NKDA    Social History none   Exam:   Vital Signs BP >140/90  several severe range BP's, mostly > 170/90    Urine Protein negative dipstick    General no apparent distress    Mental Status clear    Chest clear    Abdomen gravid, non-tender    Estimated Fetal Weight Average for gestational age    Edema no edema    Pelvic no external lesions, cervix closed and thick    Mebranes Intact    FHT normal rate with no decels    Ucx absent   Impression:   Impression 23 weeks, normal discomorts of pregnancy, pregnancy complicated by CHTN, Type 2 diabetes   Plan:   Plan UA, EFM/NST, PIH panel    Comments Discussed case with Dr. Patton SallesWeaver Lee and orders for hydralazine 20 mg IV now, then 10 mg every 10-15 mins until normal BP finger stick blood sugar was 102 labs ordered, UA WNL Also ordered internal med consult and had planned to transfer pt off the floor to medical floor here in hosp but now [facility] and [facility] will accept the transfer of this pt RN is now [facility]   Electronic  Signatures: Shella Maximutnam, Shanika Levings (CNM)  (Signed [phone]-Nov-13 00:26)  Authored: L&D Evaluation   Last Updated: 24-Nov-13 00:26 by Shella MaximPutnam, Trivia Heffelfinger (CNM)

## 2014-06-06 ENCOUNTER — Emergency Department
Admission: EM | Admit: 2014-06-06 | Discharge: 2014-06-06 | Disposition: A | Discharge status: Home / Self Care | Payer: Medicaid Other | Attending: Emergency Medicine | Admitting: Emergency Medicine

## 2014-06-06 ENCOUNTER — Encounter: Payer: Self-pay | Admitting: Emergency Medicine

## 2014-06-06 DIAGNOSIS — Y9389 Activity, other specified: Secondary | ICD-10-CM | POA: Insufficient documentation

## 2014-06-06 DIAGNOSIS — I10 Essential (primary) hypertension: Secondary | ICD-10-CM | POA: Insufficient documentation

## 2014-06-06 DIAGNOSIS — Y9289 Other specified places as the place of occurrence of the external cause: Secondary | ICD-10-CM | POA: Insufficient documentation

## 2014-06-06 DIAGNOSIS — X58XXXA Exposure to other specified factors, initial encounter: Secondary | ICD-10-CM | POA: Insufficient documentation

## 2014-06-06 DIAGNOSIS — E119 Type 2 diabetes mellitus without complications: Secondary | ICD-10-CM | POA: Diagnosis not present

## 2014-06-06 DIAGNOSIS — S4991XA Unspecified injury of right shoulder and upper arm, initial encounter: Secondary | ICD-10-CM | POA: Diagnosis present

## 2014-06-06 DIAGNOSIS — Z79899 Other long term (current) drug therapy: Secondary | ICD-10-CM | POA: Diagnosis not present

## 2014-06-06 DIAGNOSIS — Y990 Civilian activity done for income or pay: Secondary | ICD-10-CM | POA: Diagnosis not present

## 2014-06-06 DIAGNOSIS — S46911A Strain of unspecified muscle, fascia and tendon at shoulder and upper arm level, right arm, initial encounter: Secondary | ICD-10-CM | POA: Diagnosis not present

## 2014-06-06 HISTORY — DX: Essential (primary) hypertension: I10

## 2014-06-06 HISTORY — DX: Polyneuropathy, unspecified: G62.9

## 2014-06-06 MED ORDER — CYCLOBENZAPRINE HCL 5 MG PO TABS: 5.0000 mg | ORAL_TABLET | Freq: Three times a day (TID) | ORAL | Status: DC | PRN
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
## 2014-06-06 NOTE — ED Provider Notes (Signed)
[facility] Emergency Department Provider Note ?____________________________________________ ? Time seen: 1410 ? I have reviewed the triage vital signs and the nursing notes. ________ HISTORY ? Chief Complaint Shoulder Pain  HPI  Connie Whitney is a 34 y.o. female with complaints of right shoulder pain associated with work yesterday. She is a right-hand-dominant female who describes reaching up while seated, to grab the hands of an agitated client, who was scratching herself. As she grabbed the patient's hands, the patient resisted thank you, causing shoulder pain to the deltoid. She describes delayed onset about 2 hours after the incident, of increasing heaviness to the right arm and shoulder. She reports some radiation to the neck. She denies other injury and denies any is to paresthesias or grip changes.   Past Medical History  Diagnosis Date  . Lupus   . Hypertension   . Diabetes mellitus without complication   . Neuropathy    There are no active problems to display for this patient. ? Past Surgical History  Procedure Laterality Date  . Dilation and curettage, diagnostic / therapeutic    . Cesarean section    ? Current Outpatient Rx  Name  Route  Sig  Dispense  Refill  . AMITRIPTYLINE HCL PO   Oral   Take by mouth 1 day or 1 dose. Takes as needed         . amLODipine-benazepril (LOTREL) 5-40 MG per capsule   Oral   Take 1 capsule by mouth daily.         . carvedilol (COREG) 12.5 MG tablet   Oral   Take 12.5 mg by mouth 2 (two) times daily with a meal.         . cyclobenzaprine (FLEXERIL) 5 MG tablet   Oral   Take 1 tablet (5 mg total) by mouth every 8 (eight) hours as needed for muscle spasms.   12 tablet   0   . gabapentin (NEURONTIN) 600 MG tablet   Oral   Take 600-900 mg by mouth 3 (three) times daily. Take 1 tablet ( ) in the morning, 1 tablet ( ) at noon, and 1.5 tablets ( ) at bedtime.         Marland Kitchen glipiZIDE  (GLUCOTROL) 10 MG tablet   Oral   Take 10 mg by mouth 2 (two) times daily before a meal.         . ibuprofen (ADVIL,MOTRIN) 800 MG tablet   Oral   Take 1 tablet (800 mg total) by mouth every 8 (eight) hours as needed for mild pain or moderate pain.   15 tablet   0   . IRON PO   Oral   Take 1 tablet by mouth 2 (two) times daily.         . Magnesium Oxide 400 MG CAPS   Oral   Take 1 capsule (400 mg total) by mouth daily.   10 capsule   0   . metFORMIN (GLUCOPHAGE) 1000 MG tablet   Oral   Take 1,000 mg by mouth 2 (two) times daily with a meal.         . norethindrone (NOR-QD) 0.35 MG tablet   Oral   Take 1 tablet by mouth daily.         . potassium chloride (KLOR-CON) 20 MEQ packet   Oral   Take 20 mEq by mouth 2 (two) times daily.   20 tablet   0    Allergies Review of patient's allergies indicates no known allergies. ?  History reviewed. No pertinent family history. ? Social History History  Substance Use Topics  . Smoking status: Never Smoker   . Smokeless tobacco: Not on file  . Alcohol Use: No   Review of Systems  Constitutional: Negative for fever. HEENT: Negative for head trauma, visual changes, sore throat. Cardiovascular: Negative for chest pain. Respiratory: Negative for shortness of breath. Musculoskeletal: Negative for back pain. Positive for right shoulder pain. Skin: Negative for rash. Neurological: Negative for headaches, focal weakness or numbness.  10-point ROS otherwise negative. ___________________________________________  PHYSICAL EXAM:  VITAL SIGNS: ED Triage Vitals  Enc Vitals Group     BP 06/06/14 1321 149/95 mmHg     Pulse Rate 06/06/14 1321 86     Resp 06/06/14 1321 16     Temp 06/06/14 1321 98.5 F (36.9 C)     Temp Source 06/06/14 1321 Oral     SpO2 06/06/14 1321 98 %     Weight 06/06/14 1321 255 lb (115.667 kg)     Height 06/06/14 1321 5\' 11"  (1.803 m)     Head Cir --      Peak Flow --      Pain Score 06/06/14  1340 5     Pain Loc --      Pain Edu? --      Excl. in GC? --    Constitutional: Alert and oriented. Well appearing and in no distress. HEENT:Normocephalic and atraumatic.  PERRL. Normal extraocular movements.  No congestion/rhinnorhea. Mucous membranes are moist. Neck: Supple. No cervical lymphadenopathy. Cardiovascular: Normal rate, regular rhythm. No murmurs, rubs, or gallops. Normal and symmetric distal pulses are present in all extremities.  Respiratory: Normal respiratory effort without tachypnea. Breath sounds are clear and equal bilaterally. No wheezes/rales/rhonchi. Gastrointestinal: Soft and nontender. No distention. No abdominal bruits. There is no CVA tenderness. Musculoskeletal: Nontender with normal range of motion in RUE only limited by pain. Normal rotator cuff testing. Normal grip strength.  Neurologic:  Normal speech and language. CN II-XII grossly intact. No gait instability. Normal DTRs UE bilaterally.  Skin:  Skin is warm, dry and intact. No rash noted. Psychiatric: Mood and affect are normal. Patient exhibits appropriate insight and judgment. __________ RADIOLOGY Not indicated ______________________________________________________ INITIAL IMPRESSION / ASSESSMENT AND PLAN / ED COURSE Acute right shoulder strain. Treatment with muscle relaxants, ice, and gentle ROM exercises.  Patient to follow-up with Dr. Ernest PineHooten or company's preferred provider. Released to work without restrictions.  ____________________________________________ FINAL CLINICAL IMPRESSION(S) / ED DIAGNOSES?  Final diagnoses:  Shoulder strain, right, initial encounter      Lissa HoardJenise V Bacon Destinae Neubecker, PA-C 06/06/14 1558  Jene Everyobert Kinner, MD 06/06/14 [phone]

## 2014-06-06 NOTE — Discharge Instructions (Signed)
Shoulder Sprain °A shoulder sprain is the result of damage to the tough, fiber-like tissues (ligaments) that help hold your shoulder in place. The ligaments may be stretched or torn. Besides the main shoulder joint (the ball and socket), there are several smaller joints that connect the bones in this area. A sprain usually involves one of those joints. Most often it is the acromioclavicular (or AC) joint. That is the joint that connects the collarbone (clavicle) and the shoulder blade (scapula) at the top point of the shoulder blade (acromion). °A shoulder sprain is a mild form of what is called a shoulder separation. Recovering from a shoulder sprain may take some time. For some, pain lingers for several months. Most people recover without long term problems. °CAUSES  °· A shoulder sprain is usually caused by some kind of trauma. This might be: °¨ Falling on an outstretched arm. °¨ Being hit hard on the shoulder. °¨ Twisting the arm. °· Shoulder sprains are more likely to occur in people who: °¨ Play sports. °¨ Have balance or coordination problems. °SYMPTOMS  °· Pain when you move your shoulder. °· Limited ability to move the shoulder. °· Swelling and tenderness on top of the shoulder. °· Redness or warmth in the shoulder. °· Bruising. °· A change in the shape of the shoulder. °DIAGNOSIS  °Your healthcare provider may: °· Ask about your symptoms. °· Ask about recent activity that might have caused those symptoms. °· Examine your shoulder. You may be asked to do simple exercises to test movement. The other shoulder will be examined for comparison. °· Order some tests that provide a look inside the body. They can show the extent of the injury. The tests could include: °¨ X-rays. °¨ CT (computed tomography) scan. °¨ MRI (magnetic resonance imaging) scan. °RISKS AND COMPLICATIONS °· Loss of full shoulder motion. °· Ongoing shoulder pain. °TREATMENT  °How long it takes to recover from a shoulder sprain depends on how  severe it was. Treatment options may include: °· Rest. You should not use the arm or shoulder until it heals. °· Ice. For 2 or 3 days after the injury, put an ice pack on the shoulder up to 4 times a day. It should stay on for 15 to 20 minutes each time. Wrap the ice in a towel so it does not touch your skin. °· Over-the-counter medicine to relieve pain. °· A sling or brace. This will keep the arm still while the shoulder is healing. °· Physical therapy or rehabilitation exercises. These will help you regain strength and motion. Ask your healthcare provider when it is OK to begin these exercises. °· Surgery. The need for surgery is rare with a sprained shoulder, but some people may need surgery to keep the joint in place and reduce pain. °HOME CARE INSTRUCTIONS  °· Ask your healthcare provider about what you should and should not do while your shoulder heals. °· Make sure you know how to apply ice to the correct area of your shoulder. °· Talk with your healthcare provider about which medications should be used for pain and swelling. °· If rehabilitation therapy will be needed, ask your healthcare provider to refer you to a therapist. If it is not recommended, then ask about at-home exercises. Find out when exercise should begin. °SEEK MEDICAL CARE IF:  °Your pain, swelling, or redness at the joint increases. °SEEK IMMEDIATE MEDICAL CARE IF:  °· You have a fever. °· You cannot move your arm or shoulder. °Document Released: 05/07/2008 Document   Revised: 03/13/2011 Document Reviewed: 05/07/2008 [employer] Patient Information 2015 [employer] [place]. This information is not intended to replace advice given to you by your health care provider. Make sure you discuss any questions you have with your health care provider.  Take the prescription muscle relaxant as needed.  Apply ice to reduce symptoms.  Follow-up with Dr. Ernest PineHooten for ongoing symptoms.

## 2014-08-13 ENCOUNTER — Other Ambulatory Visit: Payer: Self-pay

## 2014-08-18 ENCOUNTER — Encounter: Payer: Self-pay | Admitting: *Deleted

## 2014-08-18 ENCOUNTER — Encounter
Admission: RE | Admit: 2014-08-18 | Discharge: 2014-08-18 | Disposition: A | Discharge status: Home / Self Care | Payer: Medicaid Other | Source: Ambulatory Visit | Attending: Anesthesiology | Admitting: Anesthesiology

## 2014-08-18 DIAGNOSIS — Z79899 Other long term (current) drug therapy: Secondary | ICD-10-CM | POA: Diagnosis not present

## 2014-08-18 DIAGNOSIS — Z01812 Encounter for preprocedural laboratory examination: Secondary | ICD-10-CM | POA: Insufficient documentation

## 2014-08-18 DIAGNOSIS — N920 Excessive and frequent menstruation with regular cycle: Secondary | ICD-10-CM | POA: Diagnosis present

## 2014-08-18 DIAGNOSIS — Z6836 Body mass index (BMI) 36.0-36.9, adult: Secondary | ICD-10-CM | POA: Diagnosis not present

## 2014-08-18 DIAGNOSIS — D649 Anemia, unspecified: Secondary | ICD-10-CM | POA: Diagnosis not present

## 2014-08-18 DIAGNOSIS — I10 Essential (primary) hypertension: Secondary | ICD-10-CM | POA: Diagnosis not present

## 2014-08-18 DIAGNOSIS — Z8249 Family history of ischemic heart disease and other diseases of the circulatory system: Secondary | ICD-10-CM | POA: Diagnosis not present

## 2014-08-18 DIAGNOSIS — E1140 Type 2 diabetes mellitus with diabetic neuropathy, unspecified: Secondary | ICD-10-CM | POA: Diagnosis not present

## 2014-08-18 DIAGNOSIS — M329 Systemic lupus erythematosus, unspecified: Secondary | ICD-10-CM | POA: Diagnosis not present

## 2014-08-18 DIAGNOSIS — Z9889 Other specified postprocedural states: Secondary | ICD-10-CM | POA: Diagnosis not present

## 2014-08-18 DIAGNOSIS — N938 Other specified abnormal uterine and vaginal bleeding: Secondary | ICD-10-CM | POA: Diagnosis not present

## 2014-08-18 DIAGNOSIS — Z793 Long term (current) use of hormonal contraceptives: Secondary | ICD-10-CM | POA: Diagnosis not present

## 2014-08-18 DIAGNOSIS — E669 Obesity, unspecified: Secondary | ICD-10-CM | POA: Diagnosis not present

## 2014-08-18 DIAGNOSIS — Z833 Family history of diabetes mellitus: Secondary | ICD-10-CM | POA: Diagnosis not present

## 2014-08-18 LAB — CBC
HEMATOCRIT: 27.9 % — AB (ref 35.0–47.0)
Hemoglobin: 8.7 g/dL — ABNORMAL LOW (ref 12.0–16.0)
MCH: 21.2 pg — ABNORMAL LOW (ref 26.0–34.0)
MCHC: 31 g/dL — ABNORMAL LOW (ref 32.0–36.0)
MCV: 68.4 fL — AB (ref 80.0–100.0)
Platelets: 594 10*3/uL — ABNORMAL HIGH (ref 150–440)
RBC: 4.08 MIL/uL (ref 3.80–5.20)
RDW: 19 % — ABNORMAL HIGH (ref 11.5–14.5)
WBC: 8.6 10*3/uL (ref 3.6–11.0)

## 2014-08-18 LAB — BASIC METABOLIC PANEL
Anion gap: 11 (ref 5–15)
BUN: 14 mg/dL (ref 6–20)
CHLORIDE: 100 mmol/L — AB (ref 101–111)
CO2: 29 mmol/L (ref 22–32)
Calcium: 9.2 mg/dL (ref 8.9–10.3)
Creatinine, Ser: 1.39 mg/dL — ABNORMAL HIGH (ref 0.44–1.00)
GFR calc non Af Amer: 48 mL/min — ABNORMAL LOW (ref >60)
GFR, EST AFRICAN AMERICAN: 56 mL/min — AB (ref >60)
Glucose, Bld: 104 mg/dL — ABNORMAL HIGH (ref 65–99)
POTASSIUM: 2.7 mmol/L — AB (ref 3.5–5.1)
SODIUM: 140 mmol/L (ref 135–145)

## 2014-08-18 LAB — PREGNANCY, URINE: Preg Test, Ur: NEGATIVE

## 2014-08-18 NOTE — Patient Instructions (Signed)
  Your procedure is scheduled on:08/21/14 Report to Day Surgery. [facility]OND FLOOR To find out your arrival time please call [phone] between 1PM - 3PM on 08/20/14  Remember: Instructions that are not followed completely may result in serious medical risk, up to and including death, or upon the discretion of your surgeon and anesthesiologist your surgery may need to be rescheduled.    __X__ 1. Do not eat food or drink liquids after midnight. No gum chewing or hard candies.     ___X_ 2. No Alcohol for 24 hours before or after surgery.   ____ 3. Bring all medications with you on the day of surgery if instructed.    _X___ 4. Notify your doctor if there is any change in your medical condition     (cold, fever, infections).     Do not wear jewelry, make-up, hairpins, clips or nail polish.  Do not wear lotions, powders, or perfumes. You may wear deodorant.  Do not shave 48 hours prior to surgery. Men may shave face and neck.  Do not bring valuables to the hospital.    [facility] is not responsible for any belongings or valuables.               Contacts, dentures or bridgework may not be worn into surgery.  Leave your suitcase in the car. After surgery it may be brought to your room.  For patients admitted to the hospital, discharge time is determined by your                treatment team.   Patients discharged the day of surgery will not be allowed to drive home.   Please read over the following fact sheets that you were given:   Surgical Site Infection Prevention   ____ Take these medicines the morning of surgery with A SIP OF WATER:    1. COREG  2. GABAPENTIN  3.   4.  5.  6.  ____ Fleet Enema (as directed)   ____ Use CHG Soap as directed  ____ Use inhalers on the day of surgery  ____ Stop metformin 2 days prior to surgery    ____ Take 1/2 of usual insulin dose the night before surgery and none on the morning of surgery.   ____ Stop Coumadin/Plavix/aspirin  on  ____ Stop Anti-inflammatories on    ____ Stop supplements until after surgery.    ____ Bring C-Pap to the hospital.

## 2014-08-19 NOTE — Pre-Procedure Instructions (Signed)
Called and faxed potassium result (2.7) with request to Dr Vergie Living' office for an increase in potassium supplement.

## 2014-08-21 ENCOUNTER — Encounter
Admission: RE | Disposition: A | Discharge status: Home / Self Care | Payer: Self-pay | Source: Ambulatory Visit | Attending: Obstetrics and Gynecology

## 2014-08-21 ENCOUNTER — Ambulatory Visit: Payer: Medicaid Other | Admitting: *Deleted

## 2014-08-21 ENCOUNTER — Ambulatory Visit
Admission: RE | Admit: 2014-08-21 | Discharge: 2014-08-21 | Disposition: A | Discharge status: Home / Self Care | Payer: Medicaid Other | Source: Ambulatory Visit | Attending: Obstetrics and Gynecology | Admitting: Obstetrics and Gynecology

## 2014-08-21 ENCOUNTER — Encounter: Payer: Self-pay | Admitting: *Deleted

## 2014-08-21 DIAGNOSIS — Z6836 Body mass index (BMI) 36.0-36.9, adult: Secondary | ICD-10-CM | POA: Insufficient documentation

## 2014-08-21 DIAGNOSIS — E669 Obesity, unspecified: Secondary | ICD-10-CM | POA: Insufficient documentation

## 2014-08-21 DIAGNOSIS — D649 Anemia, unspecified: Secondary | ICD-10-CM | POA: Diagnosis not present

## 2014-08-21 DIAGNOSIS — N938 Other specified abnormal uterine and vaginal bleeding: Secondary | ICD-10-CM | POA: Insufficient documentation

## 2014-08-21 DIAGNOSIS — Z8249 Family history of ischemic heart disease and other diseases of the circulatory system: Secondary | ICD-10-CM | POA: Insufficient documentation

## 2014-08-21 DIAGNOSIS — E1140 Type 2 diabetes mellitus with diabetic neuropathy, unspecified: Secondary | ICD-10-CM | POA: Insufficient documentation

## 2014-08-21 DIAGNOSIS — Z833 Family history of diabetes mellitus: Secondary | ICD-10-CM | POA: Insufficient documentation

## 2014-08-21 DIAGNOSIS — N920 Excessive and frequent menstruation with regular cycle: Secondary | ICD-10-CM | POA: Insufficient documentation

## 2014-08-21 DIAGNOSIS — I10 Essential (primary) hypertension: Secondary | ICD-10-CM | POA: Insufficient documentation

## 2014-08-21 DIAGNOSIS — Z79899 Other long term (current) drug therapy: Secondary | ICD-10-CM | POA: Insufficient documentation

## 2014-08-21 DIAGNOSIS — M329 Systemic lupus erythematosus, unspecified: Secondary | ICD-10-CM | POA: Insufficient documentation

## 2014-08-21 DIAGNOSIS — Z793 Long term (current) use of hormonal contraceptives: Secondary | ICD-10-CM | POA: Insufficient documentation

## 2014-08-21 DIAGNOSIS — Z9889 Other specified postprocedural states: Secondary | ICD-10-CM | POA: Insufficient documentation

## 2014-08-21 HISTORY — DX: Anemia, unspecified: D64.9

## 2014-08-21 HISTORY — PX: HYSTEROSCOPY WITH NOVASURE: SHX5574

## 2014-08-21 LAB — GLUCOSE, CAPILLARY
GLUCOSE-CAPILLARY: 80 mg/dL (ref 65–99)
Glucose-Capillary: 101 mg/dL — ABNORMAL HIGH (ref 65–99)

## 2014-08-21 LAB — POTASSIUM: Potassium: 3 mmol/L

## 2014-08-21 SURGERY — HYSTEROSCOPY WITH NOVASURE
Anesthesia: General

## 2014-08-21 MED ORDER — METOCLOPRAMIDE HCL 5 MG/ML IJ SOLN: 10.0000 mg | Freq: Once | INTRAMUSCULAR | Status: DC | PRN

## 2014-08-21 MED ORDER — LIDOCAINE HCL (CARDIAC) 20 MG/ML IV SOLN
INTRAVENOUS | Status: DC | PRN
  Administered 2014-08-21: 10 mg via INTRAVENOUS

## 2014-08-21 MED ORDER — OXYCODONE-ACETAMINOPHEN 5-325 MG PO TABS: 2.0000 | ORAL_TABLET | Freq: Four times a day (QID) | ORAL | Status: DC | PRN

## 2014-08-21 MED ORDER — FAMOTIDINE 20 MG PO TABS
20.0000 mg | ORAL_TABLET | Freq: Once | ORAL | Status: AC
  Administered 2014-08-21: 20 mg via ORAL

## 2014-08-21 MED ORDER — FAMOTIDINE 20 MG PO TABS
ORAL_TABLET | ORAL | Status: AC
  Administered 2014-08-21: 20 mg via ORAL
  Filled 2014-08-21: qty 1

## 2014-08-21 MED ORDER — PROPOFOL 10 MG/ML IV BOLUS
INTRAVENOUS | Status: DC | PRN
  Administered 2014-08-21: 200 mg via INTRAVENOUS

## 2014-08-21 MED ORDER — MIDAZOLAM HCL 2 MG/2ML IJ SOLN
INTRAMUSCULAR | Status: DC | PRN
  Administered 2014-08-21: 2 mg via INTRAVENOUS

## 2014-08-21 MED ORDER — OXYCODONE-ACETAMINOPHEN 5-325 MG PO TABS: 1.0000 | ORAL_TABLET | Freq: Four times a day (QID) | ORAL | Status: DC | PRN

## 2014-08-21 MED ORDER — FENTANYL CITRATE (PF) 100 MCG/2ML IJ SOLN
INTRAMUSCULAR | Status: DC | PRN
  Administered 2014-08-21 (×3): 50 ug via INTRAVENOUS

## 2014-08-21 MED ORDER — LIDOCAINE-EPINEPHRINE 1 %-1:100000 IJ SOLN
INTRAMUSCULAR | Status: AC
  Filled 2014-08-21: qty 1

## 2014-08-21 MED ORDER — OXYCODONE-ACETAMINOPHEN 5-325 MG PO TABS
ORAL_TABLET | ORAL | Status: AC
  Administered 2014-08-21: 1
  Filled 2014-08-21: qty 1

## 2014-08-21 MED ORDER — KETOROLAC TROMETHAMINE 30 MG/ML IJ SOLN
INTRAMUSCULAR | Status: DC | PRN
  Administered 2014-08-21: 30 mg via INTRAVENOUS

## 2014-08-21 MED ORDER — HYDROMORPHONE HCL 1 MG/ML IJ SOLN: 0.2500 mg | INTRAMUSCULAR | Status: DC | PRN

## 2014-08-21 MED ORDER — SILVER NITRATE-POT NITRATE 75-25 % EX MISC
CUTANEOUS | Status: AC
  Filled 2014-08-21: qty 5

## 2014-08-21 MED ORDER — DOCUSATE SODIUM 100 MG PO CAPS: 100.0000 mg | ORAL_CAPSULE | Freq: Two times a day (BID) | ORAL | Status: DC

## 2014-08-21 MED ORDER — ONDANSETRON HCL 4 MG/2ML IJ SOLN
INTRAMUSCULAR | Status: DC | PRN
  Administered 2014-08-21: 4 mg via INTRAVENOUS

## 2014-08-21 MED ORDER — SODIUM CHLORIDE 0.9 % IV SOLN
INTRAVENOUS | Status: DC
  Administered 2014-08-21: 10:00:00 via INTRAVENOUS

## 2014-08-21 SURGICAL SUPPLY — 21 items
CANISTER SUCT 3000ML (MISCELLANEOUS) ×3 IMPLANT
CATH ROBINSON RED A/P 16FR (CATHETERS) ×3 IMPLANT
GLOVE BIO SURGEON STRL SZ7 (GLOVE) ×3 IMPLANT
GLOVE INDICATOR 7.5 STRL GRN (GLOVE) ×3 IMPLANT
GOWN STRL REUS W/ TWL LRG LVL3 (GOWN DISPOSABLE) ×1 IMPLANT
GOWN STRL REUS W/ TWL XL LVL3 (GOWN DISPOSABLE) ×1 IMPLANT
GOWN STRL REUS W/TWL LRG LVL3 (GOWN DISPOSABLE) ×3
GOWN STRL REUS W/TWL XL LVL3 (GOWN DISPOSABLE) ×3
IV LACTATED RINGERS 1000ML (IV SOLUTION) ×3 IMPLANT
KIT RM TURNOVER CYSTO AR (KITS) ×3 IMPLANT
NDL SPNL 22GX5 LNG QUINC BK (NEEDLE) ×1 IMPLANT
NEEDLE SPNL 22GX5 LNG QUINC BK (NEEDLE) ×3 IMPLANT
NOVASURE ENDOMETRIAL ABLATION (MISCELLANEOUS) IMPLANT
NS IRRIG 500ML POUR BTL (IV SOLUTION) ×3 IMPLANT
PACK DNC HYST (MISCELLANEOUS) ×3 IMPLANT
PAD OB MATERNITY 4.3X12.25 (PERSONAL CARE ITEMS) ×3 IMPLANT
PAD PREP 24X41 OB/GYN DISP (PERSONAL CARE ITEMS) ×3 IMPLANT
SYR CONTROL 10ML (SYRINGE) ×3 IMPLANT
TOWEL OR 17X26 4PK STRL BLUE (TOWEL DISPOSABLE) ×3 IMPLANT
TUBING CONNECTING 10 (TUBING) ×2 IMPLANT
TUBING CONNECTING 10' (TUBING) ×1

## 2014-08-21 NOTE — H&P (Signed)
GYN H&P Office H&P reviewed and no updates Patient for hysteroscopy, novasure endometrial ablation. Can proceed when all parties are ready  Cornelia Copa MD [facility]  Pager: [phone]

## 2014-08-21 NOTE — Anesthesia Postprocedure Evaluation (Signed)
  Anesthesia Post-op Note  Patient: Connie Whitney  Procedure(s) Performed: Procedure(s): HYSTEROSCOPY WITH NOVASURE (N/A)  Anesthesia type:General  Patient location: PACU  Post pain: Pain level controlled  Post assessment: Post-op Vital signs reviewed, Patient's Cardiovascular Status Stable, Respiratory Function Stable, Patent Airway and No signs of Nausea or vomiting  Post vital signs: Reviewed and stable  Last Vitals:  Filed Vitals:   08/21/14 1147  BP: 138/91  Pulse: 74  Temp: 36.6 C  Resp: 20    Level of consciousness: awake, alert  and patient cooperative  Complications: No apparent anesthesia complications

## 2014-08-21 NOTE — Anesthesia Procedure Notes (Signed)
Procedure Name: LMA Insertion Date/Time: 08/21/2014 10:07 AM Performed by: Omer Jack Pre-anesthesia Checklist: Patient identified, Patient being monitored, Timeout performed, Emergency Drugs available and Suction available Patient Re-evaluated:Patient Re-evaluated prior to inductionOxygen Delivery Method: Circle system utilized Preoxygenation: Pre-oxygenation with 100% oxygen Intubation Type: IV induction Ventilation: Mask ventilation without difficulty LMA: LMA inserted LMA Size: 4.0 Tube type: Oral Number of attempts: 1 Placement Confirmation: positive ETCO2 and breath sounds checked- equal and bilateral Tube secured with: Tape Dental Injury: Teeth and Oropharynx as per pre-operative assessment

## 2014-08-21 NOTE — Discharge Instructions (Addendum)
We will discuss your surgery once again in detail at your post-op visit in two to four weeks. If you havent already done so, please call to make your appointment as soon as possible.  [facility]  [address]  [place], [place] [address] [place], [place] [address]  Phone: [phone] Phone: [phone]  Fax: [phone] Fax: [phone]   Hysteroscopy, Care After Refer to this sheet in the next few weeks. These instructions provide you with information on caring for yourself after your procedure. Your health care provider may also give you more specific instructions. Your treatment has been planned according to current medical practices, but problems sometimes occur. Call your health care provider if you have any problems or questions after your procedure.  WHAT TO EXPECT AFTER THE PROCEDURE After your procedure, it is typical to have the following:  You may have some cramping. This normally lasts for a couple days.  You may have bleeding. This can vary from light spotting for a few days to menstrual-like bleeding for 3-7 days. HOME CARE INSTRUCTIONS  Rest for the first 1-2 days after the procedure.  Only take over-the-counter or prescription medicines as directed by your health care provider. Do not take aspirin. It can increase the chances of bleeding.  Take showers instead of baths for 4 weeks or as directed by your health care provider.  Do not drive for 24 hours or as directed.  Do not drink alcohol while taking pain medicine.  Do not use tampons, douche, or have sexual intercourse for 4 weeks or until your health care provider says it is okay.  Follow your health care provider's advice about diet, exercise, and lifting.  If you develop constipation, you may:  Take a mild laxative if your health care provider approves.  Add bran foods to your diet.  Drink enough fluids to keep your urine clear or pale yellow.  Try to have someone  with you or available to you for the first 24-48 hours, especially if you were given a general anesthetic.  Follow up with your health care provider as directed. SEEK MEDICAL CARE IF:  You feel dizzy or lightheaded.  You feel sick to your stomach (nauseous).  You have abnormal vaginal discharge.  You have a rash.  You have pain that is not controlled with medicine. SEEK IMMEDIATE MEDICAL CARE IF:  You have bleeding that is heavier than a normal menstrual period.  You have a fever.  You have increasing cramps or pain, not controlled with medicine.  You have new belly (abdominal) pain.  You pass out.  You have pain in the tops of your shoulders (shoulder strap areas).  You have shortness of breath. Document Released: 10/09/2012 Document Reviewed: 10/09/2012 [facility] Patient Information 2015 [place], [place]. This information is not intended to replace advice given to you by your health care provider. Make sure you discuss any questions you have with your health care provider.     AMBULATORY SURGERY  DISCHARGE INSTRUCTIONS   1) The drugs that you were given will stay in your system until tomorrow so for the next 24 hours you should not:  A) Drive an automobile B) Make any legal decisions C) Drink any alcoholic beverage   2) You may resume regular meals tomorrow.  Today it is better to start with liquids and gradually work up to solid foods.  You may eat anything you prefer, but it is better to start with liquids, then soup and crackers,  and gradually work up to solid foods.   3) Please notify your doctor immediately if you have any unusual bleeding, trouble breathing, redness and pain at the surgery site, drainage, fever, or pain not relieved by medication.    4) Additional Instructions:        Please contact your physician with any problems or Same Day Surgery at [phone], Monday through Friday 6 am to 4 pm, or [place] at [facility] number at  [phone].

## 2014-08-21 NOTE — Transfer of Care (Signed)
Immediate Anesthesia Transfer of Care Note  Patient: Connie Whitney  Procedure(s) Performed: Procedure(s): HYSTEROSCOPY WITH NOVASURE (N/A)  Patient Location: PACU  Anesthesia Type:General  Level of Consciousness: sedated and responds to stimulation  Airway & Oxygen Therapy: Patient Spontanous Breathing and Patient connected to face mask oxygen  Post-op Assessment: Report given to RN and Post -op Vital signs reviewed and stable  Post vital signs: Reviewed and stable  Last Vitals:  Filed Vitals:   08/21/14 1051  BP: 117/75  Pulse: 74  Temp: 36.8 C  Resp: 22    Complications: No apparent anesthesia complications

## 2014-08-21 NOTE — Anesthesia Preprocedure Evaluation (Signed)
Anesthesia Evaluation  Patient identified by MRN, date of birth, ID band Patient awake    Reviewed: Allergy & Precautions, NPO status , Patient's Chart, lab work & pertinent test results  Airway Mallampati: II  TM Distance: >3 FB Neck ROM: Full    Dental  (+) Teeth Intact   Pulmonary    Pulmonary exam normal       Cardiovascular Exercise Tolerance: Good hypertension, Pt. on medications and Pt. on home beta blockers Normal cardiovascular exam    Neuro/Psych    GI/Hepatic negative GI ROS,   Endo/Other  diabetes, Type 2BG 101.  Renal/GU      Musculoskeletal   Abdominal (+) + obese,  Abdomen: soft.    Peds  Hematology  (+) anemia , Hb 8.7.   Anesthesia Other Findings   Reproductive/Obstetrics                             Anesthesia Physical Anesthesia Plan  ASA: III  Anesthesia Plan: General   Post-op Pain Management:    Induction: Intravenous  Airway Management Planned: LMA  Additional Equipment:   Intra-op Plan:   Post-operative Plan: Extubation in OR  Informed Consent: I have reviewed the patients History and Physical, chart, labs and discussed the procedure including the risks, benefits and alternatives for the proposed anesthesia with the patient or authorized representative who has indicated his/her understanding and acceptance.     Plan Discussed with: CRNA  Anesthesia Plan Comments:         Anesthesia Quick Evaluation

## 2014-08-21 NOTE — Op Note (Signed)
Operative Note   08/21/2014  PRE-OP DIAGNOSIS: Anemia. Menorrhagia   POST-OP DIAGNOSIS: Same   SURGEON: Surgeon(s) and Role:    * [facility] Bing, MD - Primary  ASSISTANT: None  PROCEDURE: Procedure(s): HYSTEROSCOPY WITH NOVASURE   ANESTHESIA: General  ESTIMATED BLOOD LOSS: 10mL  DRAINS: None   TOTAL IV FLUIDS: crystalloid  SPECIMENS: none  VTE PROPHYLAXIS: SCDs to the bilateral lower extremities  ANTIBIOTICS: none indicated  FLUID DEFICIT: 25mL  COMPLICATIONS: none  DISPOSITION: PACU - hemodynamically stable.  CONDITION: stable  FINDINGS: Exam under anesthesia revealed small, mobile anteverted uterus uterus with no masses and bilateral adnexa without masses or fullness. Hysteroscopy revealed a grossly normal appearing uterine cavity (atrophic) with bilateral tubal ostia and normal appearing endocervical canal. Uniform ablation seen throughout cavity after procedure.   PROCEDURE IN DETAIL:  After informed consent was obtained, the patient was taken to the operating room where anesthesia was obtained without difficulty. The patient was positioned in the dorsal lithotomy position in [place] stirrups.  The patient's bladder was catheterized with an in and out foley catheter.  The patient was examined under anesthesia, with the above noted findings.  The bi-valved speculum was placed inside the patient's vagina, and the the anterior lip of the cervix was seen and grasped with the tenaculum.  The uterine cavity was sounded to 8cm, with cavity length of 5cm; the patient is on her period and easily passed the hysteroscope, with the above noted findings. This was removed and the Novasure used in the usual fashion: cavity width 3.4cm, power 94, time 80 seconds. This was then removed and the hysteroscope reintroduced with the above noted findings The hystersocope was removed and excellent hemostasis was noted, and all instruments were removed, with excellent hemostasis noted  throughout.  She was then taken out of dorsal lithotomy. The patient tolerated the procedure well.   The patient was taken to recovery room in excellent condition.  Cornelia Copa MD [facility] Pager [phone]

## 2014-10-12 ENCOUNTER — Encounter: Payer: Self-pay | Admitting: Emergency Medicine

## 2014-10-12 ENCOUNTER — Emergency Department
Admission: EM | Admit: 2014-10-12 | Discharge: 2014-10-12 | Disposition: A | Discharge status: Home / Self Care | Payer: Medicaid Other | Attending: Emergency Medicine | Admitting: Emergency Medicine

## 2014-10-12 ENCOUNTER — Emergency Department: Payer: Medicaid Other

## 2014-10-12 DIAGNOSIS — E119 Type 2 diabetes mellitus without complications: Secondary | ICD-10-CM | POA: Insufficient documentation

## 2014-10-12 DIAGNOSIS — M722 Plantar fascial fibromatosis: Secondary | ICD-10-CM | POA: Diagnosis not present

## 2014-10-12 DIAGNOSIS — I10 Essential (primary) hypertension: Secondary | ICD-10-CM | POA: Insufficient documentation

## 2014-10-12 DIAGNOSIS — Z79899 Other long term (current) drug therapy: Secondary | ICD-10-CM | POA: Diagnosis not present

## 2014-10-12 DIAGNOSIS — Z791 Long term (current) use of non-steroidal anti-inflammatories (NSAID): Secondary | ICD-10-CM | POA: Diagnosis not present

## 2014-10-12 DIAGNOSIS — M25571 Pain in right ankle and joints of right foot: Secondary | ICD-10-CM | POA: Diagnosis present

## 2014-10-12 MED ORDER — HYDROCODONE-ACETAMINOPHEN 5-325 MG PO TABS: 1.0000 | ORAL_TABLET | ORAL | Status: DC | PRN

## 2014-10-12 MED ORDER — MELOXICAM 15 MG PO TABS: 15.0000 mg | ORAL_TABLET | Freq: Every day | ORAL | Status: DC

## 2014-10-12 NOTE — Discharge Instructions (Signed)
Plantar Fasciitis Plantar fasciitis is a painful foot condition that affects the heel. It occurs when the band of tissue that connects the toes to the heel bone (plantar fascia) becomes irritated. This can happen after exercising too much or doing other repetitive activities (overuse injury). The pain from plantar fasciitis can range from mild irritation to severe pain that makes it difficult for you to walk or move. The pain is usually worse in the morning or after you have been sitting or lying down for a while. CAUSES This condition may be caused by:  Standing for long periods of time.  Wearing shoes that do not fit.  Doing high-impact activities, including running, aerobics, and ballet.  Being overweight.  Having an abnormal way of walking (gait).  Having tight calf muscles.  Having high arches in your feet.  Starting a new athletic activity. SYMPTOMS The main symptom of this condition is heel pain. Other symptoms include:  Pain that gets worse after activity or exercise.  Pain that is worse in the morning or after resting.  Pain that goes away after you walk for a few minutes. DIAGNOSIS This condition may be diagnosed based on your signs and symptoms. Your health care provider will also do a physical exam to check for:  A tender area on the bottom of your foot.  A high arch in your foot.  Pain when you move your foot.  Difficulty moving your foot. You may also need to have imaging studies to confirm the diagnosis. These can include:  X-rays.  Ultrasound.  MRI. TREATMENT  Treatment for plantar fasciitis depends on the severity of the condition. Your treatment may include:  Rest, ice, and over-the-counter pain medicines to manage your pain.  Exercises to stretch your calves and your plantar fascia.  A splint that holds your foot in a stretched, upward position while you sleep (night splint).  Physical therapy to relieve symptoms and prevent problems in the  future.  Cortisone injections to relieve severe pain.  Extracorporeal shock wave therapy (ESWT) to stimulate damaged plantar fascia with electrical impulses. It is often used as a last resort before surgery.  Surgery, if other treatments have not worked after 12 months. HOME CARE INSTRUCTIONS  Take medicines only as directed by your health care provider.  Avoid activities that cause pain.  Roll the bottom of your foot over a bag of ice or a bottle of cold water. Do this for 20 minutes, 3-4 times a day.  Perform simple stretches as directed by your health care provider.  Try wearing athletic shoes with air-sole or gel-sole cushions or soft shoe inserts.  Wear a night splint while sleeping, if directed by your health care provider.  Keep all follow-up appointments with your health care provider. PREVENTION   Do not perform exercises or activities that cause heel pain.  Consider finding low-impact activities if you continue to have problems.  Lose weight if you need to. The best way to prevent plantar fasciitis is to avoid the activities that aggravate your plantar fascia. SEEK MEDICAL CARE IF:  Your symptoms do not go away after treatment with home care measures.  Your pain gets worse.  Your pain affects your ability to move or do your daily activities.   This information is not intended to replace advice given to you by your health care provider. Make sure you discuss any questions you have with your health care provider.   Document Released: 09/13/2000 Document Revised: 09/09/2014 Document Reviewed: 10/29/2013 Elsevier   Interactive Patient Education 2016 [employer]  

## 2014-10-12 NOTE — ED Notes (Signed)
Pt presents with right ankle pain which starts at the bottom of her foot and radiates up into her calf. Pt states pain began approximately one month ago and has gotten progressively worse.

## 2014-10-12 NOTE — ED Provider Notes (Signed)
[facility] Emergency Department Provider Note ____________________________________________  Time seen: Approximately 5:47 PM  I have reviewed the triage vital signs and the nursing notes.   HISTORY  Chief Complaint Ankle Pain   HPI Connie Whitney is a 34 y.o. female who presents to the emergency department for evaluation of right foot and ankle pain. She states the pain has been progressively worsening over the past month.    Past Medical History  Diagnosis Date  . Lupus (HCC)   . Hypertension   . Diabetes mellitus without complication (HCC)   . Neuropathy (HCC)   . Anemia     There are no active problems to display for this patient.   Past Surgical History  Procedure Laterality Date  . Dilation and curettage, diagnostic / therapeutic    . Cesarean section    . Tubal ligation    . Hysteroscopy with novasure N/A 08/21/2014    Procedure: HYSTEROSCOPY WITH NOVASURE;  Surgeon: [facility] Bing, MD;  Location: [facility] ORS;  Service: Gynecology;  Laterality: N/A;    Current Outpatient Rx  Name  Route  Sig  Dispense  Refill  . AMITRIPTYLINE HCL PO   Oral   Take by mouth 1 day or 1 dose. Takes as needed         . amLODipine-benazepril (LOTREL) 5-40 MG per capsule   Oral   Take 1 capsule by mouth daily.         . carvedilol (COREG) 12.5 MG tablet   Oral   Take 12.5 mg by mouth 2 (two) times daily with a meal.         . cyclobenzaprine (FLEXERIL) 5 MG tablet   Oral   Take 1 tablet (5 mg total) by mouth every 8 (eight) hours as needed for muscle spasms.   12 tablet   0   . docusate sodium (COLACE) 100 MG capsule   Oral   Take 1 capsule (100 mg total) by mouth 2 (two) times daily.   20 capsule   0   . gabapentin (NEURONTIN) 600 MG tablet   Oral   Take 600-900 mg by mouth 3 (three) times daily. Take 1 tablet (600mg ) in the morning, 1 tablet (600mg ) at noon, and 1.5 tablets (900mg ) at bedtime.         Marland Kitchen glipiZIDE (GLUCOTROL) 10 MG  tablet   Oral   Take 10 mg by mouth 2 (two) times daily before a meal.         . hydrochlorothiazide (MICROZIDE) 12.5 MG capsule   Oral   Take by mouth daily.         Marland Kitchen HYDROcodone-acetaminophen (NORCO/VICODIN) 5-325 MG tablet   Oral   Take 1 tablet by mouth every 4 (four) hours as needed.   6 tablet   0   . IRON PO   Oral   Take 1 tablet by mouth 2 (two) times daily.         . Magnesium Oxide 400 MG CAPS   Oral   Take 1 capsule (400 mg total) by mouth daily.   10 capsule   0   . meloxicam (MOBIC) 15 MG tablet   Oral   Take 1 tablet (15 mg total) by mouth daily.   30 tablet   2   . metFORMIN (GLUCOPHAGE) 1000 MG tablet   Oral   Take 1,000 mg by mouth 2 (two) times daily with a meal.         . norethindrone (NOR-QD)  0.35 MG tablet   Oral   Take 1 tablet by mouth daily.         Marland Kitchen oxyCODONE-acetaminophen (ROXICET) 5-325 MG per tablet   Oral   Take 2 tablets by mouth every 6 (six) hours as needed for severe pain.   6 tablet   0   . potassium chloride (KLOR-CON) 20 MEQ packet   Oral   Take 20 mEq by mouth 2 (two) times daily.   20 tablet   0   . sitaGLIPtin (JANUVIA) 100 MG tablet   Oral   Take 100 mg by mouth daily.           Allergies Review of patient's allergies indicates no known allergies.  History reviewed. No pertinent family history.  Social History Social History  Substance Use Topics  . Smoking status: Never Smoker   . Smokeless tobacco: Never Used  . Alcohol Use: No    Review of Systems Constitutional: No recent illness. Eyes: No visual changes. ENT: No sore throat. Cardiovascular: Denies chest pain or palpitations. Respiratory: Denies shortness of breath. Gastrointestinal: No abdominal pain.  Genitourinary: Negative for dysuria. Musculoskeletal: Pain in right foot and ankle.  Skin: Negative for rash. Neurological: Negative for headaches, focal weakness or numbness. 10-point ROS otherwise  negative.  ____________________________________________   PHYSICAL EXAM:  VITAL SIGNS: ED Triage Vitals  Enc Vitals Group     BP 10/12/14 1738 172/104 mmHg     Pulse Rate 10/12/14 1738 92     Resp 10/12/14 1738 18     Temp 10/12/14 1738 99.2 F (37.3 C)     Temp Source 10/12/14 1738 Oral     SpO2 10/12/14 1738 99 %     Weight 10/12/14 1738 244 lb (110.678 kg)     Height 10/12/14 1738  (1.778 m)     Head Cir --      Peak Flow --      Pain Score 10/12/14 1739 10     Pain Loc --      Pain Edu? --      Excl. in GC? --     Constitutional: Alert and oriented. Well appearing and in no acute distress. Eyes: Conjunctivae are normal. EOMI. Head: Atraumatic. Nose: No congestion/rhinnorhea. Neck: No stridor.  Respiratory: Normal respiratory effort.   Musculoskeletal: Tenderness noted in the plantar aspect of the right heel with radiation into the ankle and right calf. Negative Homans Sign. Negative Thompson's sign. No sign of cellulitis or foreign body in the heel.  Neurologic:  Normal speech and language. No gross focal neurologic deficits are appreciated. Speech is normal. No gait instability. Skin:  Skin is warm, dry and intact. Atraumatic. Psychiatric: Mood and affect are normal. Speech and behavior are normal.  ____________________________________________   LABS (all labs ordered are listed, but only abnormal results are displayed)  Labs Reviewed - No data to display ____________________________________________  RADIOLOGY  Negative for acute bony abnormality. ____________________________________________   PROCEDURES  Procedure(s) performed: None   ____________________________________________   INITIAL IMPRESSION / ASSESSMENT AND PLAN / ED COURSE  Pertinent labs & imaging results that were available during my care of the patient were reviewed by me and considered in my medical decision making (see chart for details).  Patient was advised to follow-up  with the podiatrist. She was advised to take the medications as prescribed. She was advised to return to emergency department for symptoms change or worsen if she is unable schedule an appointment with a specialist or  primary care provider. ____________________________________________   FINAL CLINICAL IMPRESSION(S) / ED DIAGNOSES  Final diagnoses:  Plantar fasciitis of right foot       Chinita Pester, FNP 10/12/14 1839  Jennye Moccasin, MD 10/12/14 2129

## 2015-02-23 ENCOUNTER — Emergency Department
Admission: EM | Admit: 2015-02-23 | Discharge: 2015-02-23 | Disposition: A | Discharge status: Home / Self Care | Payer: Medicaid Other | Attending: Emergency Medicine | Admitting: Emergency Medicine

## 2015-02-23 ENCOUNTER — Encounter: Payer: Self-pay | Admitting: Emergency Medicine

## 2015-02-23 DIAGNOSIS — I10 Essential (primary) hypertension: Secondary | ICD-10-CM | POA: Insufficient documentation

## 2015-02-23 DIAGNOSIS — J069 Acute upper respiratory infection, unspecified: Secondary | ICD-10-CM | POA: Insufficient documentation

## 2015-02-23 DIAGNOSIS — Z791 Long term (current) use of non-steroidal anti-inflammatories (NSAID): Secondary | ICD-10-CM | POA: Insufficient documentation

## 2015-02-23 DIAGNOSIS — E119 Type 2 diabetes mellitus without complications: Secondary | ICD-10-CM | POA: Insufficient documentation

## 2015-02-23 DIAGNOSIS — Z79899 Other long term (current) drug therapy: Secondary | ICD-10-CM | POA: Insufficient documentation

## 2015-02-23 DIAGNOSIS — Z7984 Long term (current) use of oral hypoglycemic drugs: Secondary | ICD-10-CM | POA: Insufficient documentation

## 2015-02-23 DIAGNOSIS — R05 Cough: Secondary | ICD-10-CM | POA: Diagnosis present

## 2015-02-23 LAB — RAPID INFLUENZA A&B ANTIGENS (ARMC ONLY)
INFLUENZA A (ARMC): NOT DETECTED
INFLUENZA B (ARMC): NOT DETECTED

## 2015-02-23 MED ORDER — HYDROCOD POLST-CPM POLST ER 10-8 MG/5ML PO SUER: 5.0000 mL | Freq: Every evening | ORAL | Status: DC | PRN

## 2015-02-23 NOTE — ED Provider Notes (Signed)
[facility] Emergency Department Provider Note  Time seen: 9:56 PM  I have reviewed the triage vital signs and the nursing notes.   HISTORY  Chief Complaint URI    HPI Connie Whitney is a 35 y.o. female with a past medical history of lupus, hypertension, diabetes who presents to the emergency department with cough and congestion for the past 2 days. According to the patient for the past 2 days she has had cough, congestion, sore throat, nasal drainage, subjective fevers/chills. Patient was concerned that it could be the flu so she came to the emergency department for evaluation. Describes her symptoms as moderate. Denies chest pain, abdominal pain, nausea, vomiting, diarrhea.     Past Medical History  Diagnosis Date  . Lupus (HCC)   . Hypertension   . Diabetes mellitus without complication (HCC)   . Neuropathy (HCC)   . Anemia     There are no active problems to display for this patient.   Past Surgical History  Procedure Laterality Date  . Dilation and curettage, diagnostic / therapeutic    . Cesarean section    . Tubal ligation    . Hysteroscopy with novasure N/A 08/21/2014    Procedure: HYSTEROSCOPY WITH NOVASURE;  Surgeon: [place] Bing, MD;  Location: [facility] ORS;  Service: Gynecology;  Laterality: N/A;    Current Outpatient Rx  Name  Route  Sig  Dispense  Refill  . AMITRIPTYLINE HCL PO   Oral   Take by mouth 1 day or 1 dose. Takes as needed         . amLODipine-benazepril (LOTREL) 5-40 MG per capsule   Oral   Take 1 capsule by mouth daily.         . carvedilol (COREG) 12.5 MG tablet   Oral   Take 12.5 mg by mouth 2 (two) times daily with a meal.         . cyclobenzaprine (FLEXERIL) 5 MG tablet   Oral   Take 1 tablet (5 mg total) by mouth every 8 (eight) hours as needed for muscle spasms.   12 tablet   0   . docusate sodium (COLACE) 100 MG capsule   Oral   Take 1 capsule (100 mg total) by mouth 2 (two) times daily.   20  capsule   0   . gabapentin (NEURONTIN) 600 MG tablet   Oral   Take 600-900 mg by mouth 3 (three) times daily. Take 1 tablet ( ) in the morning, 1 tablet ( ) at noon, and 1.5 tablets ( ) at bedtime.         Marland Kitchen glipiZIDE (GLUCOTROL) 10 MG tablet   Oral   Take 10 mg by mouth 2 (two) times daily before a meal.         . hydrochlorothiazide (MICROZIDE) 12.5 MG capsule   Oral   Take by mouth daily.         Marland Kitchen HYDROcodone-acetaminophen (NORCO/VICODIN) 5-325 MG tablet   Oral   Take 1 tablet by mouth every 4 (four) hours as needed.   6 tablet   0   . IRON PO   Oral   Take 1 tablet by mouth 2 (two) times daily.         . Magnesium Oxide 400 MG CAPS   Oral   Take 1 capsule (400 mg total) by mouth daily.   10 capsule   0   . meloxicam (MOBIC) 15 MG tablet   Oral   Take 1 tablet (15  mg total) by mouth daily.   30 tablet   2   . metFORMIN (GLUCOPHAGE) 1000 MG tablet   Oral   Take 1,000 mg by mouth 2 (two) times daily with a meal.         . norethindrone (NOR-QD) 0.35 MG tablet   Oral   Take 1 tablet by mouth daily.         Marland Kitchen oxyCODONE-acetaminophen (ROXICET) 5-325 MG per tablet   Oral   Take 2 tablets by mouth every 6 (six) hours as needed for severe pain.   6 tablet   0   . potassium chloride (KLOR-CON) 20 MEQ packet   Oral   Take 20 mEq by mouth 2 (two) times daily.   20 tablet   0   . sitaGLIPtin (JANUVIA) 100 MG tablet   Oral   Take 100 mg by mouth daily.           Allergies Review of patient's allergies indicates no known allergies.  History reviewed. No pertinent family history.  Social History Social History  Substance Use Topics  . Smoking status: Never Smoker   . Smokeless tobacco: Never Used  . Alcohol Use: No    Review of Systems Constitutional: Subjective fevers/chills. Positive for nasal congestion. Cardiovascular: Negative for chest pain. Respiratory: Negative for shortness of breath. Positive for  cough Gastrointestinal: Negative for abdominal pain, vomiting and diarrhea Skin: Negative for rash. Neurological: Negative for headache 10-point ROS otherwise negative.  ____________________________________________   PHYSICAL EXAM:  VITAL SIGNS: ED Triage Vitals  Enc Vitals Group     BP 02/23/15 2105 158/87 mmHg     Pulse Rate 02/23/15 2105 98     Resp 02/23/15 2105 22     Temp 02/23/15 2105 100.1 F (37.8 C)     Temp Source 02/23/15 2105 Oral     SpO2 02/23/15 2105 100 %     Weight 02/23/15 2105 254 lb (115.214 kg)     Height 02/23/15 2105 5\' 10"  (1.778 m)     Head Cir --      Peak Flow --      Pain Score --      Pain Loc --      Pain Edu? --      Excl. in GC? --     Constitutional: Alert and oriented. Well appearing and in no distress. Eyes: Normal exam ENT   Head: Normocephalic and atraumatic. Moderate nasal congestion.   Mouth/Throat: Mucous membranes are moist. Cardiovascular: Normal rate, regular rhythm. No murmur Respiratory: Normal respiratory effort without tachypnea nor retractions. Breath sounds are clear. Occasional cough during exam.Gastrointestinal: Soft and nontender. No distention.   Musculoskeletal: Nontender with normal range of motion in all extremities.  Neurologic:  Normal speech and language. No gross focal neurologic deficits Skin:  Skin is warm, dry and intact.  Psychiatric: Mood and affect are normal. Speech and behavior are normal.   ____________________________________________     INITIAL IMPRESSION / ASSESSMENT AND PLAN / ED COURSE  Pertinent labs & imaging results that were available during my care of the patient were reviewed by me and considered in my medical decision making (see chart for details).  Patient presents to the emergency department with upper respiratory symptoms of cough, congestion, subjective fever and chills. Patient has a 100.1 temperature in the emergency department today, exam most consistent with an upper  respiratory infection, moderate nasal congestion, occasional cough. Patient does have clear lung sounds. Suspect likely viral URI. Influenza test  negative. We will discharge with just next to be used as needed for cough at night, otherwise I discussed supportive care such as Tylenol, Motrin, over-the-counter cold remedies. Patient is agreeable.  ____________________________________________   FINAL CLINICAL IMPRESSION(S) / ED DIAGNOSES  Upper respiratory infection   Minna Antis, MD 02/23/15 2159

## 2015-02-23 NOTE — ED Notes (Signed)
Pt reports 1.5 days of cough, congestion and flu like symptoms

## 2015-02-23 NOTE — Discharge Instructions (Signed)

## 2015-04-08 ENCOUNTER — Encounter: Payer: Self-pay | Admitting: Anesthesiology

## 2015-04-08 ENCOUNTER — Ambulatory Visit: Payer: Medicaid Other | Attending: Anesthesiology | Admitting: Anesthesiology

## 2015-04-08 VITALS — BP 123/83 | HR 86 | Temp 99.1°F | Resp 18 | Ht 70.0 in | Wt 243.0 lb

## 2015-04-08 DIAGNOSIS — M5416 Radiculopathy, lumbar region: Secondary | ICD-10-CM

## 2015-04-08 DIAGNOSIS — M5136 Other intervertebral disc degeneration, lumbar region: Secondary | ICD-10-CM

## 2015-04-08 DIAGNOSIS — M545 Low back pain, unspecified: Secondary | ICD-10-CM

## 2015-04-08 MED ORDER — TRAMADOL HCL 50 MG PO TABS: 50.0000 mg | ORAL_TABLET | Freq: Two times a day (BID) | ORAL | Status: DC | PRN

## 2015-04-08 NOTE — Progress Notes (Signed)
Safety precautions to be maintained throughout the outpatient stay will include: orient to surroundings, keep bed in low position, maintain call bell within reach at all times, provide assistance with transfer out of bed and ambulation.  

## 2015-04-08 NOTE — Patient Instructions (Signed)
Epidural Steroid Injection Patient Information  Description: The epidural space surrounds the nerves as they exit the spinal cord.  In some patients, the nerves can be compressed and inflamed by a bulging disc or a tight spinal canal (spinal stenosis).  By injecting steroids into the epidural space, we can bring irritated nerves into direct contact with a potentially helpful medication.  These steroids act directly on the irritated nerves and can reduce swelling and inflammation which often leads to decreased pain.  Epidural steroids may be injected anywhere along the spine and from the neck to the low back depending upon the location of your pain.   After numbing the skin with local anesthetic (like Novocaine), a small needle is passed into the epidural space slowly.  You may experience a sensation of pressure while this is being done.  The entire block usually last less than 10 minutes.  Conditions which may be treated by epidural steroids:   Low back and leg pain  Neck and arm pain  Spinal stenosis  Post-laminectomy syndrome  Herpes zoster (shingles) pain  Pain from compression fractures  Preparation for the injection:  1. Do not eat any solid food or dairy products within 8 hours of your appointment.  2. You may drink clear liquids up to 3 hours before appointment.  Clear liquids include water, black coffee, juice or soda.  No milk or cream please. 3. You may take your regular medication, including pain medications, with a sip of water before your appointment  Diabetics should hold regular insulin (if taken separately) and take 1/2 normal NPH dos the morning of the procedure.  Carry some sugar containing items with you to your appointment. 4. A driver must accompany you and be prepared to drive you home after your procedure.  5. Bring all your current medications with your. 6. An IV may be inserted and sedation may be given at the discretion of the physician.   7. A blood pressure  cuff, EKG and other monitors will often be applied during the procedure.  Some patients may need to have extra oxygen administered for a short period. 8. You will be asked to provide medical information, including your allergies, prior to the procedure.  We must know immediately if you are taking blood thinners (like Coumadin/Warfarin)  Or if you are allergic to IV iodine contrast (dye). We must know if you could possible be pregnant.  Possible side-effects:  Bleeding from needle site  Infection (rare, may require surgery)  Nerve injury (rare)  Numbness & tingling (temporary)  Difficulty urinating (rare, temporary)  Spinal headache ( a headache worse with upright posture)  Light -headedness (temporary)  Pain at injection site (several days)  Decreased blood pressure (temporary)  Weakness in arm/leg (temporary)  Pressure sensation in back/neck (temporary)  Call if you experience:  Fever/chills associated with headache or increased back/neck pain.  Headache worsened by an upright position.  New onset weakness or numbness of an extremity below the injection site  Hives or difficulty breathing (go to the emergency room)  Inflammation or drainage at the infection site  Severe back/neck pain  Any new symptoms which are concerning to you  Please note:  Although the local anesthetic injected can often make your back or neck feel good for several hours after the injection, the pain will likely return.  It takes 3-7 days for steroids to work in the epidural space.  You may not notice any pain relief for at least that one week.    If effective, we will often do a series of three injections spaced 3-6 weeks apart to maximally decrease your pain.  After the initial series, we generally will wait several months before considering a repeat injection of the same type.  If you have any questions, please call [phone] [facility] 

## 2015-04-12 DIAGNOSIS — M545 Low back pain, unspecified: Secondary | ICD-10-CM | POA: Insufficient documentation

## 2015-04-12 DIAGNOSIS — M5137 Other intervertebral disc degeneration, lumbosacral region: Secondary | ICD-10-CM | POA: Insufficient documentation

## 2015-04-12 DIAGNOSIS — M5136 Other intervertebral disc degeneration, lumbar region: Secondary | ICD-10-CM

## 2015-04-12 DIAGNOSIS — M5416 Radiculopathy, lumbar region: Secondary | ICD-10-CM | POA: Insufficient documentation

## 2015-04-12 DIAGNOSIS — M51379 Other intervertebral disc degeneration, lumbosacral region without mention of lumbar back pain or lower extremity pain: Secondary | ICD-10-CM | POA: Insufficient documentation

## 2015-04-12 NOTE — Progress Notes (Signed)
Subjective:    Patient ID: Connie Whitney, female    DOB: [birth year], 35 y.o.   MRN: [id number]  HPI  This patient is a 35 year old lady presents with midline back pain which radiates into the right hip and cephalad up the thoracic spine into the neck She is had this pain for the past 7 months and it followed a back injury while a guest in a hotel She describes the pain as dull and constant in nature but it becomes occasionally sharp and electric-like in nature  Pain intensity rating Subjective pain intensity rating is 70% Her pain is relieved by TENS units Pain is aggravated by prolonged sitting and stooping and bending  Pain medications Patient takes cyclobenzaprine at night for pain She has used meloxicam and oxycodone in the past  Other medications Her medications include hydrochlorothiazide, amlodipine amitriptyline coreg neurontin Glucotrol linzess tradjenta metformin  Potassium chloride    Allergies There are no known allergies  Past medical history Past medical history is positive for a 10 year history of diabetes hypertension systemic lupus erythematosus migraine headaches and irritable bowel syndrome  Past Surgical history Past surgical history is positive for D&C and tubal ligation endometrial ablation and cesarean section She is para 1+  1 miscarriage and 1 abortion ion)  Social and economic history She does not smoke She does not use alcohol Does not use illicit drugs She works part-time as a Financial trader  She has had no imaging studies done  Family history Mother is alive and well at age 2 Father is alive and well at age 12 He has no brothers Has one sister who is alive and well at age  Review of Systems  Constitutional: Negative.  Negative for fever, chills, diaphoresis, activity change, appetite change and fatigue.  HENT: Negative.  Negative for congestion, dental problem, drooling, ear discharge, ear pain, facial swelling, hearing  loss, mouth sores, nosebleeds, postnasal drip, rhinorrhea, sinus pressure, sneezing, sore throat, tinnitus, trouble swallowing and voice change.   Eyes: Negative.  Negative for photophobia, pain, discharge, redness, itching and visual disturbance.  Respiratory: Negative.  Negative for apnea, cough, choking, chest tightness, shortness of breath, wheezing and stridor.   Cardiovascular: Negative.  Negative for chest pain, palpitations and leg swelling.  Gastrointestinal: Negative.  Negative for nausea, vomiting, abdominal pain, diarrhea, constipation, blood in stool, abdominal distention, anal bleeding and rectal pain.  Endocrine: Negative.  Negative for cold intolerance, heat intolerance, polydipsia, polyphagia and polyuria.  Genitourinary: Negative for dysuria, urgency, frequency, hematuria, flank pain, decreased urine volume, enuresis, difficulty urinating, genital sores, menstrual problem, pelvic pain and dyspareunia.  Musculoskeletal: Positive for myalgias, back pain, joint swelling, arthralgias and gait problem. Negative for neck pain and neck stiffness.  Skin: Negative.  Negative for color change, pallor, rash and wound.  Allergic/Immunologic: Negative.  Negative for environmental allergies, food allergies and immunocompromised state.  Neurological: Negative.  Negative for dizziness, tremors, seizures, syncope, facial asymmetry, speech difficulty, weakness, light-headedness, numbness and headaches.  Hematological: Negative.  Negative for adenopathy. Does not bruise/bleed easily.  Psychiatric/Behavioral: Negative.  Negative for suicidal ideas, hallucinations, behavioral problems, confusion, sleep disturbance, self-injury, dysphoric mood, decreased concentration and agitation. The patient is not nervous/anxious and is not hyperactive.        Objective:   Physical Exam  Constitutional: She is oriented to person, place, and time. She appears well-developed and well-nourished. No distress.  HENT:   Head: Normocephalic and atraumatic.  Right Ear: External ear normal.  Left Ear: External  ear normal.  Nose: Nose normal.  Mouth/Throat: Oropharynx is clear and moist. No oropharyngeal exudate.  Eyes: Conjunctivae and EOM are normal. Pupils are equal, round, and reactive to light. Right eye exhibits no discharge. Left eye exhibits no discharge. No scleral icterus.  Neck: Normal range of motion. Neck supple. No JVD present. No tracheal deviation present. No thyromegaly present.  Cardiovascular: Normal rate, regular rhythm, normal heart sounds and intact distal pulses.  Exam reveals no gallop and no friction rub.   No murmur heard. Pulmonary/Chest: Effort normal and breath sounds normal. No respiratory distress. She has no wheezes. She has no rales. She exhibits no tenderness.  Abdominal: Soft. She exhibits no distension and no mass. There is no tenderness. There is no rebound and no guarding.  Genitourinary:  Genito-urinary examination was deferred  Musculoskeletal: She exhibits tenderness.  Patient has tenderness in the lower lumbar spine which is greater on the right than on the left Torsion tests was positive especially on the right Straight leg raising test on the right was 30 Straight leg raising test on the left was 90  Neurological evaluation use and light sensation over the right leg Range of motion was decreased especially on the right side  Lymphadenopathy:    She has no cervical adenopathy.  Neurological: She is alert and oriented to person, place, and time. She has normal reflexes. She displays normal reflexes. No cranial nerve deficit. She exhibits normal muscle tone. Coordination normal.  Skin: Skin is warm and dry. No rash noted. She is not diaphoretic. No erythema. No pallor.  Psychiatric: She has a normal mood and affect. Her behavior is normal. Judgment and thought content normal.  Nursing note and vitals reviewed.         Assessment & Plan:   Assessmen 1  chronic low back pain 2  lumbar degenerative disc disease 3) lumbar radiculopathy  4  Systemic lupus erythematosus     Plan of management 1 caudal epidural steroid injection 2 right lumbar transforaminal epidural st at L3 L4 L5 3 trauma and all 50 mg twice a day when necessary and give 45 tablets 4 MRI of the lumbar spine 5 status post systemic lupus erythematosus   New patient       Level   4    Tod PersiaWinston Lafonda Patron M.D.

## 2015-05-07 ENCOUNTER — Ambulatory Visit: Payer: Medicaid Other | Attending: Anesthesiology | Admitting: Anesthesiology

## 2015-05-07 ENCOUNTER — Encounter: Payer: Self-pay | Admitting: Anesthesiology

## 2015-05-07 VITALS — BP 126/78 | HR 90 | Temp 98.7°F | Resp 16 | Ht 70.0 in | Wt 254.0 lb

## 2015-05-07 DIAGNOSIS — M545 Low back pain, unspecified: Secondary | ICD-10-CM

## 2015-05-07 DIAGNOSIS — M5116 Intervertebral disc disorders with radiculopathy, lumbar region: Secondary | ICD-10-CM | POA: Insufficient documentation

## 2015-05-07 DIAGNOSIS — M5416 Radiculopathy, lumbar region: Secondary | ICD-10-CM

## 2015-05-07 DIAGNOSIS — M5136 Other intervertebral disc degeneration, lumbar region: Secondary | ICD-10-CM

## 2015-05-07 DIAGNOSIS — G8929 Other chronic pain: Secondary | ICD-10-CM | POA: Diagnosis not present

## 2015-05-07 MED ORDER — IOPAMIDOL (ISOVUE-M 200) INJECTION 41%: 20.0000 mL | INTRAMUSCULAR | Status: DC | PRN

## 2015-05-07 MED ORDER — FENTANYL CITRATE (PF) 100 MCG/2ML IJ SOLN
INTRAMUSCULAR | Status: AC
  Administered 2015-05-07: 50 ug via INTRAVENOUS
  Filled 2015-05-07: qty 2

## 2015-05-07 MED ORDER — TRIAMCINOLONE ACETONIDE 40 MG/ML IJ SUSP
INTRAMUSCULAR | Status: AC
  Administered 2015-05-07: 14:00:00
  Filled 2015-05-07: qty 1

## 2015-05-07 MED ORDER — IOPAMIDOL (ISOVUE-M 200) INJECTION 41%
INTRAMUSCULAR | Status: AC
  Administered 2015-05-07: 14:00:00
  Filled 2015-05-07: qty 10

## 2015-05-07 MED ORDER — BUPIVACAINE HCL (PF) 0.25 % IJ SOLN
INTRAMUSCULAR | Status: AC
  Administered 2015-05-07: 14:00:00
  Filled 2015-05-07: qty 30

## 2015-05-07 MED ORDER — MIDAZOLAM HCL 5 MG/5ML IJ SOLN
INTRAMUSCULAR | Status: AC
  Administered 2015-05-07: 2 mg via INTRAVENOUS
  Filled 2015-05-07: qty 5

## 2015-05-07 MED ORDER — FENTANYL CITRATE (PF) 100 MCG/2ML IJ SOLN: 50.0000 ug | INTRAMUSCULAR | Status: DC

## 2015-05-07 MED ORDER — TRAMADOL HCL 50 MG PO TABS: 50.0000 mg | ORAL_TABLET | Freq: Two times a day (BID) | ORAL | Status: DC | PRN

## 2015-05-07 MED ORDER — MIDAZOLAM HCL 5 MG/5ML IJ SOLN: 1.0000 mg | INTRAMUSCULAR | Status: DC

## 2015-05-07 MED ORDER — TRIAMCINOLONE ACETONIDE 40 MG/ML IJ SUSP: 80.0000 mg | Freq: Once | INTRAMUSCULAR | Status: DC

## 2015-05-07 MED ORDER — BUPIVACAINE HCL (PF) 0.25 % IJ SOLN: 20.0000 mL | Freq: Once | INTRAMUSCULAR | Status: DC

## 2015-05-07 NOTE — Progress Notes (Signed)
Safety precautions to be maintained throughout the outpatient stay will include: orient to surroundings, keep bed in low position, maintain call bell within reach at all times, provide assistance with transfer out of bed and ambulation.  

## 2015-05-07 NOTE — Patient Instructions (Addendum)
Pain Management Discharge Instructions  General Discharge Instructions :  If you need to reach your doctor call: Monday-Friday 8:00 am - 4:00 pm at [phone] or toll free [phone].  After clinic hours [phone] to have operator reach doctor.  Bring all of your medication bottles to all your appointments in the pain clinic.  To cancel or reschedule your appointment with Pain Management please remember to call 24 hours in advance to avoid a fee.  Refer to the educational materials which you have been given on: General Risks, I had my Procedure. Discharge Instructions, Post Sedation.  Post Procedure Instructions:  The drugs you were given will stay in your system until tomorrow, so for the next 24 hours you should not drive, make any legal decisions or drink any alcoholic beverages.  You may eat anything you prefer, but it is better to start with liquids then soups and crackers, and gradually work up to solid foods.  Please notify your doctor immediately if you have any unusual bleeding, trouble breathing or pain that is not related to your normal pain.  Depending on the type of procedure that was done, some parts of your body may feel week and/or numb.  This usually clears up by tonight or the next day.  Walk with the use of an assistive device or accompanied by an adult for the 24 hours.  You may use ice on the affected area for the first 24 hours.  Put ice in a Ziploc bag and cover with a towel and place against area 15 minutes on 15 minutes off.  You may switch to heat after 24 hours.GENERAL RISKS AND COMPLICATIONS  What are the risk, side effects and possible complications? Generally speaking, most procedures are safe.  However, with any procedure there are risks, side effects, and the possibility of complications.  The risks and complications are dependent upon the sites that are lesioned, or the type of nerve block to be performed.  The closer the procedure is to the spine,  the more serious the risks are.  Great care is taken when placing the radio frequency needles, block needles or lesioning probes, but sometimes complications can occur.  Infection: Any time there is an injection through the skin, there is a risk of infection.  This is why sterile conditions are used for these blocks.  There are four possible types of infection.  Localized skin infection.  Central Nervous System Infection-This can be in the form of Meningitis, which can be deadly.  Epidural Infections-This can be in the form of an epidural abscess, which can cause pressure inside of the spine, causing compression of the spinal cord with subsequent paralysis. This would require an emergency surgery to decompress, and there are no guarantees that the patient would recover from the paralysis.  Discitis-This is an infection of the intervertebral discs.  It occurs in about 1% of discography procedures.  It is difficult to treat and it may lead to surgery.        2. Pain: the needles have to go through skin and soft tissues, will cause soreness.       3. Damage to internal structures:  The nerves to be lesioned may be near blood vessels or    other nerves which can be potentially damaged.       4. Bleeding: Bleeding is more common if the patient is taking blood thinners such as  aspirin, Coumadin, Ticiid, Plavix, etc., or if he/she have some genetic predisposition  such as  hemophilia. Bleeding into the spinal canal can cause compression of the spinal  cord with subsequent paralysis.  This would require an emergency surgery to  decompress and there are no guarantees that the patient would recover from the  paralysis.       5. Pneumothorax:  Puncturing of a lung is a possibility, every time a needle is introduced in  the area of the chest or upper back.  Pneumothorax refers to free air around the  collapsed lung(s), inside of the thoracic cavity (chest cavity).  Another two possible  complications related to  a similar event would include: Hemothorax and Chylothorax.   These are variations of the Pneumothorax, where instead of air around the collapsed  lung(s), you may have blood or chyle, respectively.       6. Spinal headaches: They may occur with any procedures in the area of the spine.       7. Persistent CSF (Cerebro-Spinal Fluid) leakage: This is a rare problem, but may occur  with prolonged intrathecal or epidural catheters either due to the formation of a fistulous  track or a dural tear.       8. Nerve damage: By working so close to the spinal cord, there is always a possibility of  nerve damage, which could be as serious as a permanent spinal cord injury with  paralysis.       9. Death:  Although rare, severe deadly allergic reactions known as "Anaphylactic  reaction" can occur to any of the medications used.      10. Worsening of the symptoms:  We can always make thing worse.  What are the chances of something like this happening? Chances of any of this occuring are extremely low.  By statistics, you have more of a chance of getting killed in a motor vehicle accident: while driving to the hospital than any of the above occurring .  Nevertheless, you should be aware that they are possibilities.  In general, it is similar to taking a shower.  Everybody knows that you can slip, hit your head and get killed.  Does that mean that you should not shower again?  Nevertheless always keep in mind that statistics do not mean anything if you happen to be on the wrong side of them.  Even if a procedure has a 1 (one) in a 1,000,000 (million) chance of going wrong, it you happen to be that one..Also, keep in mind that by statistics, you have more of a chance of having something go wrong when taking medications.  Who should not have this procedure? If you are on a blood thinning medication (e.g. Coumadin, Plavix, see list of "Blood Thinners"), or if you have an active infection going on, you should not have the  procedure.  If you are taking any blood thinners, please inform your physician.  How should I prepare for this procedure?  Do not eat or drink anything at least six hours prior to the procedure.  Bring a driver with you .  It cannot be a taxi.  Come accompanied by an adult that can drive you back, and that is strong enough to help you if your legs get weak or numb from the local anesthetic.  Take all of your medicines the morning of the procedure with just enough water to swallow them.  If you have diabetes, make sure that you are scheduled to have your procedure done first thing in the morning, whenever possible.  If you have diabetes,  take only half of your insulin dose and notify our nurse that you have done so as soon as you arrive at the clinic.  If you are diabetic, but only take blood sugar pills (oral hypoglycemic), then do not take them on the morning of your procedure.  You may take them after you have had the procedure.  Do not take aspirin or any aspirin-containing medications, at least eleven (11) days prior to the procedure.  They may prolong bleeding.  Wear loose fitting clothing that may be easy to take off and that you would not mind if it got stained with Betadine or blood.  Do not wear any jewelry or perfume  Remove any nail coloring.  It will interfere with some of our monitoring equipment.  NOTE: Remember that this is not meant to be interpreted as a complete list of all possible complications.  Unforeseen problems may occur.  BLOOD THINNERS The following drugs contain aspirin or other products, which can cause increased bleeding during surgery and should not be taken for 2 weeks prior to and 1 week after surgery.  If you should need take something for relief of minor pain, you may take acetaminophen which is found in Tylenol,m Datril, Anacin-3 and Panadol. It is not blood thinner. The products listed below are.  Do not take any of the products listed below in  addition to any listed on your instruction sheet.  A.P.C or A.P.C with Codeine Codeine Phosphate Capsules #3 Ibuprofen Ridaura  ABC compound Congesprin Imuran rimadil  Advil Cope Indocin Robaxisal  Alka-Seltzer Effervescent Pain Reliever and Antacid Coricidin or Coricidin-D  Indomethacin Rufen  Alka-Seltzer plus Cold Medicine Cosprin Ketoprofen S-A-C Tablets  Anacin Analgesic Tablets or Capsules Coumadin Korlgesic Salflex  Anacin Extra Strength Analgesic tablets or capsules CP-2 Tablets Lanoril Salicylate  Anaprox Cuprimine Capsules Levenox Salocol  Anexsia-D Dalteparin Magan Salsalate  Anodynos Darvon compound Magnesium Salicylate Sine-off  Ansaid Dasin Capsules Magsal Sodium Salicylate  Anturane Depen Capsules Marnal Soma  APF Arthritis pain formula Dewitt's Pills Measurin Stanback  Argesic Dia-Gesic Meclofenamic Sulfinpyrazone  Arthritis Bayer Timed Release Aspirin Diclofenac Meclomen Sulindac  Arthritis pain formula Anacin Dicumarol Medipren Supac  Analgesic (Safety coated) Arthralgen Diffunasal Mefanamic Suprofen  Arthritis Strength Bufferin Dihydrocodeine Mepro Compound Suprol  Arthropan liquid Dopirydamole Methcarbomol with Aspirin Synalgos  ASA tablets/Enseals Disalcid Micrainin Tagament  Ascriptin Doan's Midol Talwin  Ascriptin A/D Dolene Mobidin Tanderil  Ascriptin Extra Strength Dolobid Moblgesic Ticlid  Ascriptin with Codeine Doloprin or Doloprin with Codeine Momentum Tolectin  Asperbuf Duoprin Mono-gesic Trendar  Aspergum Duradyne Motrin or Motrin IB Triminicin  Aspirin plain, buffered or enteric coated Durasal Myochrisine Trigesic  Aspirin Suppositories Easprin Nalfon Trillsate  Aspirin with Codeine Ecotrin Regular or Extra Strength Naprosyn Uracel  Atromid-S Efficin Naproxen Ursinus  Auranofin Capsules Elmiron Neocylate Vanquish  Axotal Emagrin Norgesic Verin  Azathioprine Empirin or Empirin with Codeine Normiflo Vitamin E  Azolid Emprazil Nuprin Voltaren  Bayer  Aspirin plain, buffered or children's or timed BC Tablets or powders Encaprin Orgaran Warfarin Sodium  Buff-a-Comp Enoxaparin Orudis Zorpin  Buff-a-Comp with Codeine Equegesic Os-Cal-Gesic   Buffaprin Excedrin plain, buffered or Extra Strength Oxalid   Bufferin Arthritis Strength Feldene Oxphenbutazone   Bufferin plain or Extra Strength Feldene Capsules Oxycodone with Aspirin   Bufferin with Codeine Fenoprofen Fenoprofen Pabalate or Pabalate-SF   Buffets II Flogesic Panagesic   Buffinol plain or Extra Strength Florinal or Florinal with Codeine Panwarfarin   Buf-Tabs Flurbiprofen Penicillamine   Butalbital Compound Four-way cold tablets  Penicillin   Butazolidin Fragmin Pepto-Bismol   Carbenicillin Geminisyn Percodan   Carna Arthritis Reliever Geopen Persantine   Carprofen Gold's salt Persistin   Chloramphenicol Goody's Phenylbutazone   Chloromycetin Haltrain Piroxlcam   Clmetidine heparin Plaquenil   Cllnoril Hyco-pap Ponstel   Clofibrate Hydroxy chloroquine Propoxyphen         Before stopping any of these medications, be sure to consult the physician who ordered them.  Some, such as Coumadin (Warfarin) are ordered to prevent or treat serious conditions such as "deep thrombosis", "pumonary embolisms", and other heart problems.  The amount of time that you may need off of the medication may also vary with the medication and the reason for which you were taking it.  If you are taking any of these medications, please make sure you notify your pain physician before you undergo any procedures.         Epidural Steroid Injection An epidural steroid injection is given to relieve pain in your neck, back, or legs that is caused by the irritation or swelling of a nerve root. This procedure involves injecting a steroid and numbing medicine (anesthetic) into the epidural space. The epidural space is the space between the outer covering of your spinal cord and the bones that form your backbone  (vertebra).  LET YOUR HEALTH CARE PROVIDER KNOW ABOUT:   Any allergies you have.  All medicines you are taking, including vitamins, herbs, eye drops, creams, and over-the-counter medicines such as aspirin.  Previous problems you or members of your family have had with the use of anesthetics.  Any blood disorders or blood clotting disorders you have.  Previous surgeries you have had.  Medical conditions you have. RISKS AND COMPLICATIONS Generally, this is a safe procedure. However, as with any procedure, complications can occur. Possible complications of epidural steroid injection include:  Headache.  Bleeding.  Infection.  Allergic reaction to the medicines.  Damage to your nerves. The response to this procedure depends on the underlying cause of the pain and its duration. People who have long-term (chronic) pain are less likely to benefit from epidural steroids than are those people whose pain comes on strong and suddenly. BEFORE THE PROCEDURE   Ask your health care provider about changing or stopping your regular medicines. You may be advised to stop taking blood-thinning medicines a few days before the procedure.  You may be given medicines to reduce anxiety.  Arrange for someone to take you home after the procedure. PROCEDURE   You will remain awake during the procedure. You may receive medicine to make you relaxed.  You will be asked to lie on your stomach.  The injection site will be cleaned.  The injection site will be numbed with a medicine (local anesthetic).  A needle will be injected through your skin into the epidural space.  Your health care provider will use an X-ray machine to ensure that the steroid is delivered closest to the affected nerve. You may have minimal discomfort at this time.  Once the needle is in the right position, the local anesthetic and the steroid will be injected into the epidural space.  The needle will then be removed and a  bandage will be applied to the injection site. AFTER THE PROCEDURE   You may be monitored for a short time before you go home.  You may feel weakness or numbness in your arm or leg, which disappears within hours.  You may be allowed to eat, drink, and take your regular   medicine.  You may have soreness at the site of the injection.   This information is not intended to replace advice given to you by your health care provider. Make sure you discuss any questions you have with your health care provider.   Document Released: 03/28/2007 Document Revised: 08/21/2012 Document Reviewed: 06/07/2012 Elsevier Interactive Patient Education 2016 [employer]  

## 2015-05-09 NOTE — Procedures (Signed)
Date of procedure:  05/09/2015  Preoperative Diagnosis:  1 Chronic Low Back Pain 2 lumbar DDD 3 Lumbar Radiculopathy  Postoperative Diagnosis:  Same.  Procedure: 1. Caudal epidural steroid injection, 2. Epidural with interpretation. 3. Fluoroscopic guidance.  Surgeon: Tod PersiaWinston Rocco Kerkhoff, MD  Anesthesia: MAC anesthesia by the nursing staff under my direction.  Informed consent was obtained and the patient appeared to accept and understand the benefits and risks of this procedure.   Pre procedure comments:  None  Description of the Procedure:  The patient was taken to the operating room and placed in the prone position.  Intravenous sedation and MAC anesthesia was administered by the nursing staff under my direction.. After appropriate sedation, the sacrococcygeal area was prepped with Betadine.  After adequate draping, the area between the sacral cornu was palpated and infiltrated with 3 cc of 1% Lidocaine.   An AP fluoroscopic view of the sacrum was visualized and a 17 gauge Tuohy needle was inserted in the midline at the angle of 45 degrees through the sacrococcygeal membrane.  After making contact with the bone, the needle was withdrawn and readvanced in horizontal position, into the caudal epidural space.  Epidurogram Study:  One cc of Isovue 200 was injected through the needle and epidurogram was visualized in both the later and AP views. After injecting the contrast into the Tuohy needle the contrast media was observed to spread cephalad as high as L3 but the predominant spread was on the Right side with very little on the left side. This was indicative of Left Transforaminal stenosis.  Comments:  This procedure was performed using Fluoroscopic guidance. Fluoroscopic time was 0.1 minutes Number of fluoroscopic frames were 2 MGY was 3.5  Caudal Epidural Steroid Injection:  Then 10 cc of 0.25% Bupivacaine and 80 mg of Kenalog were injected into the Caudal epidural space.   The needle was removed and adequate hemostasis was established.    The patient tolerated the procedure quite well and vital signs were stable.  There were no adverse effects.  Additional comments:    The patient was taken to the recovery room in satisfactory condition where the patient was observed and subsequently discharged home.   Thye patient was given tramadol 50 mg TID pv for 2 weeks Will follow up in the clinic in the next week.   Tod PersiaWinston Rashida Ladouceur MD

## 2015-05-10 ENCOUNTER — Telehealth: Payer: Self-pay

## 2015-05-10 NOTE — Telephone Encounter (Signed)
Post procedure phone call.  Left message.  

## 2015-06-04 ENCOUNTER — Encounter: Payer: Self-pay | Admitting: Anesthesiology

## 2015-06-04 ENCOUNTER — Ambulatory Visit: Payer: Medicaid Other | Attending: Anesthesiology | Admitting: Anesthesiology

## 2015-06-04 VITALS — BP 140/91 | HR 109 | Temp 99.6°F | Resp 16 | Ht 70.0 in | Wt 240.0 lb

## 2015-06-04 DIAGNOSIS — M545 Low back pain, unspecified: Secondary | ICD-10-CM

## 2015-06-04 DIAGNOSIS — M5116 Intervertebral disc disorders with radiculopathy, lumbar region: Secondary | ICD-10-CM | POA: Insufficient documentation

## 2015-06-04 DIAGNOSIS — G8929 Other chronic pain: Secondary | ICD-10-CM | POA: Diagnosis not present

## 2015-06-04 DIAGNOSIS — M5416 Radiculopathy, lumbar region: Secondary | ICD-10-CM

## 2015-06-04 DIAGNOSIS — M5136 Other intervertebral disc degeneration, lumbar region: Secondary | ICD-10-CM

## 2015-06-04 MED ORDER — TIZANIDINE HCL 4 MG PO TABS
4.0000 mg | ORAL_TABLET | Freq: Three times a day (TID) | ORAL | Status: DC
Start: 2015-06-04 — End: 2015-08-11

## 2015-06-04 MED ORDER — TRAMADOL HCL 50 MG PO TABS: 50.0000 mg | ORAL_TABLET | Freq: Two times a day (BID) | ORAL | Status: DC | PRN

## 2015-06-04 NOTE — Progress Notes (Signed)
Safety precautions to be maintained throughout the outpatient stay will include: orient to surroundings, keep bed in low position, maintain call bell within reach at all times, provide assistance with transfer out of bed and ambulation.  

## 2015-06-04 NOTE — Patient Instructions (Signed)
You were given prescriptions for Tramadol and Zanaflex today. Epidural Steroid Injection Patient Information  Description: The epidural space surrounds the nerves as they exit the spinal cord.  In some patients, the nerves can be compressed and inflamed by a bulging disc or a tight spinal canal (spinal stenosis).  By injecting steroids into the epidural space, we can bring irritated nerves into direct contact with a potentially helpful medication.  These steroids act directly on the irritated nerves and can reduce swelling and inflammation which often leads to decreased pain.  Epidural steroids may be injected anywhere along the spine and from the neck to the low back depending upon the location of your pain.   After numbing the skin with local anesthetic (like Novocaine), a small needle is passed into the epidural space slowly.  You may experience a sensation of pressure while this is being done.  The entire block usually last less than 10 minutes.  Conditions which may be treated by epidural steroids:   Low back and leg pain  Neck and arm pain  Spinal stenosis  Post-laminectomy syndrome  Herpes zoster (shingles) pain  Pain from compression fractures  Preparation for the injection:  1. Do not eat any solid food or dairy products within 8 hours of your appointment.  2. You may drink clear liquids up to 3 hours before appointment.  Clear liquids include water, black coffee, juice or soda.  No milk or cream please. 3. You may take your regular medication, including pain medications, with a sip of water before your appointment  Diabetics should hold regular insulin (if taken separately) and take 1/2 normal NPH dos the morning of the procedure.  Carry some sugar containing items with you to your appointment. 4. A driver must accompany you and be prepared to drive you home after your procedure.  5. Bring all your current medications with your. 6. An IV may be inserted and sedation may be  given at the discretion of the physician.   7. A blood pressure cuff, EKG and other monitors will often be applied during the procedure.  Some patients may need to have extra oxygen administered for a short period. 8. You will be asked to provide medical information, including your allergies, prior to the procedure.  We must know immediately if you are taking blood thinners (like Coumadin/Warfarin)  Or if you are allergic to IV iodine contrast (dye). We must know if you could possible be pregnant.  Possible side-effects:  Bleeding from needle site  Infection (rare, may require surgery)  Nerve injury (rare)  Numbness & tingling (temporary)  Difficulty urinating (rare, temporary)  Spinal headache ( a headache worse with upright posture)  Light -headedness (temporary)  Pain at injection site (several days)  Decreased blood pressure (temporary)  Weakness in arm/leg (temporary)  Pressure sensation in back/neck (temporary)  Call if you experience:  Fever/chills associated with headache or increased back/neck pain.  Headache worsened by an upright position.  New onset weakness or numbness of an extremity below the injection site  Hives or difficulty breathing (go to the emergency room)  Inflammation or drainage at the infection site  Severe back/neck pain  Any new symptoms which are concerning to you  Please note:  Although the local anesthetic injected can often make your back or neck feel good for several hours after the injection, the pain will likely return.  It takes 3-7 days for steroids to work in the epidural space.  You may not notice any  pain relief for at least that one week.  If effective, we will often do a series of three injections spaced 3-6 weeks apart to maximally decrease your pain.  After the initial series, we generally will wait several months before considering a repeat injection of the same type.  If you have any questions, please call [phone] [facility]

## 2015-06-08 NOTE — Progress Notes (Signed)
   Subjective:    Patient ID: Connie Whitney, female    DOB: November 12, 1980, 35 y.o.   MRN: [id number]  HPI  This patient returned to the clinic today indicating that she obtained 3 days of excellent pain relief following the caudal epidural steroid injection She indicates that the tramadol is working well in relieving her pain but it causes spasms of her limbs She is otherwise doing fineand is pleased with her treatment Her subjective pain intensity rating is 45%  Review of Systems  Constitutional: Negative.   HENT: Negative.   Eyes: Negative.   Respiratory: Negative.   Cardiovascular: Negative.   Gastrointestinal: Negative.   Endocrine: Negative.   Genitourinary: Negative.   Musculoskeletal: Positive for myalgias, back pain and arthralgias. Negative for joint swelling, gait problem, neck pain and neck stiffness.  Skin: Negative.   Allergic/Immunologic: Negative.   Neurological: Negative.   Hematological: Negative.   Psychiatric/Behavioral: Negative.        Objective:   Physical Exam  Cardiovascular:  His patient is in no distress Her vital signs are relatively stable Her blood pressure is 141/91 mmHg Pulse is 109 bpm Equal and regular Temperature is 99.55F Respirations are 16 breaths minute S PO2 is 98% There are no new neurological nor musculoskeletal findings  Nursing note and vitals reviewed.         Assessment & Plan:   Assessment 1 chronic low back pain 2 lumbar degenerative disc disease 3 umbar radiculopathy    Plan of management 1 we'll continue tramadol 50 mg twice a day for 2 weeks and give 28 pills 2 we'll begin Zanaflex4 mg 3 times a day for 3 weeks and give 63 pills 3 we'll discontinue Flexeril 4 we'll plan a caudal epidural steroid injection in 1 month   Established patient      Level III    Tod PersiaWinston Jolleen Seman M.D.

## 2015-07-09 ENCOUNTER — Ambulatory Visit: Payer: Self-pay | Admitting: Anesthesiology

## 2015-08-11 ENCOUNTER — Emergency Department: Payer: Medicaid Other

## 2015-08-11 ENCOUNTER — Emergency Department
Admission: EM | Admit: 2015-08-11 | Discharge: 2015-08-11 | Disposition: A | Discharge status: Home / Self Care | Payer: Medicaid Other | Attending: Emergency Medicine | Admitting: Emergency Medicine

## 2015-08-11 DIAGNOSIS — M797 Fibromyalgia: Secondary | ICD-10-CM | POA: Diagnosis not present

## 2015-08-11 DIAGNOSIS — E119 Type 2 diabetes mellitus without complications: Secondary | ICD-10-CM | POA: Diagnosis not present

## 2015-08-11 DIAGNOSIS — Z79899 Other long term (current) drug therapy: Secondary | ICD-10-CM | POA: Diagnosis not present

## 2015-08-11 DIAGNOSIS — R079 Chest pain, unspecified: Secondary | ICD-10-CM | POA: Diagnosis present

## 2015-08-11 DIAGNOSIS — Z7984 Long term (current) use of oral hypoglycemic drugs: Secondary | ICD-10-CM | POA: Insufficient documentation

## 2015-08-11 DIAGNOSIS — R0602 Shortness of breath: Secondary | ICD-10-CM | POA: Diagnosis not present

## 2015-08-11 DIAGNOSIS — I10 Essential (primary) hypertension: Secondary | ICD-10-CM | POA: Insufficient documentation

## 2015-08-11 LAB — BASIC METABOLIC PANEL
ANION GAP: 11 (ref 5–15)
BUN: 13 mg/dL (ref 6–20)
CO2: 28 mmol/L (ref 22–32)
Calcium: 9.9 mg/dL (ref 8.9–10.3)
Chloride: 99 mmol/L — ABNORMAL LOW (ref 101–111)
Creatinine, Ser: 0.9 mg/dL (ref 0.44–1.00)
GFR calc Af Amer: >60 mL/min (ref >60)
GLUCOSE: 157 mg/dL — AB (ref 65–99)
POTASSIUM: 2.7 mmol/L — AB (ref 3.5–5.1)
Sodium: 138 mmol/L (ref 135–145)

## 2015-08-11 LAB — CBC
HEMATOCRIT: 33.6 % — AB (ref 35.0–47.0)
HEMOGLOBIN: 10.9 g/dL — AB (ref 12.0–16.0)
MCH: 23.9 pg — AB (ref 26.0–34.0)
MCHC: 32.6 g/dL (ref 32.0–36.0)
MCV: 73.5 fL — ABNORMAL LOW (ref 80.0–100.0)
Platelets: 533 10*3/uL — ABNORMAL HIGH (ref 150–440)
RBC: 4.57 MIL/uL (ref 3.80–5.20)
RDW: 17.6 % — ABNORMAL HIGH (ref 11.5–14.5)
WBC: 7.7 10*3/uL (ref 3.6–11.0)

## 2015-08-11 LAB — TROPONIN I: Troponin I: <0.03 ng/mL (ref <0.03)

## 2015-08-11 MED ORDER — TRAMADOL HCL 50 MG PO TABS: 50.0000 mg | ORAL_TABLET | Freq: Four times a day (QID) | ORAL | 0 refills | Status: DC | PRN

## 2015-08-11 NOTE — ED Provider Notes (Signed)
[facility] Emergency Department Provider Note  Time seen: 8:25 PM  I have reviewed the triage vital signs and the nursing notes.   HISTORY  Chief Complaint Chest Pain    HPI Connie Whitney is a 35 y.o. female with a past medical history of diabetes, lupus, fibromyalgia, who presents the emergency department with intermittent chest pain and whole body pain.According to the patient for the past 2-3 days she has been having intermittent chest pain with occasional shortness of breath. However she also states she is having pain across her entire body and does not know if this is related to her fibromyalgia. Patient states at baseline she takes Ultram and Zanaflex, however states the pain clinic has not been able to see her for the past 2 months and she has been off his medications. Denies any nausea or diaphoresis. Denies any history of heart disease.  Past Medical History:  Diagnosis Date  . Anemia   . Diabetes mellitus without complication (HCC)   . Hypertension   . Lupus (HCC)   . Migraines   . Neuropathy (HCC)   . Vitamin D deficiency     Patient Active Problem List   Diagnosis Date Noted  . Back pain at L4-L5 level 04/12/2015  . DDD (degenerative disc disease), lumbar 04/12/2015  . Lumbar radiculopathy 04/12/2015    Past Surgical History:  Procedure Laterality Date  . CESAREAN SECTION    . DILATION AND CURETTAGE, DIAGNOSTIC / THERAPEUTIC    . HYSTEROSCOPY WITH NOVASURE N/A 08/21/2014   Procedure: HYSTEROSCOPY WITH NOVASURE;  Surgeon: Salmon Bingharlie Pickens, MD;  Location: [facility] ORS;  Service: Gynecology;  Laterality: N/A;  . TUBAL LIGATION      Prior to Admission medications   Medication Sig Start Date End Date Taking? Authorizing Provider  cyclobenzaprine (FLEXERIL) 5 MG tablet Take 1 tablet (5 mg total) by mouth every 8 (eight) hours as needed for muscle spasms. 06/06/14  Yes Jenise V Bacon Menshew, PA-C  AMITRIPTYLINE HCL PO Take 10 mg by mouth 1 day or  1 dose. Takes as needed    Historical Provider, MD  amLODipine-benazepril (LOTREL) 10-40 MG capsule Take 1 capsule by mouth daily. Reported on 06/04/2015    Historical Provider, MD  amLODipine-benazepril (LOTREL) 5-40 MG per capsule Take 1 capsule by mouth daily. Reported on 04/08/2015    Historical Provider, MD  carvedilol (COREG) 12.5 MG tablet Take 25 mg by mouth 2 (two) times daily with a meal.     Historical Provider, MD  ergocalciferol (VITAMIN D2) 50000 units capsule Take 50,000 Units by mouth once a week.    Historical Provider, MD  gabapentin (NEURONTIN) 600 MG tablet Take 300 mg by mouth. Take 2 caps in a.m., 2 at mid day, 3 in evening    Historical Provider, MD  glipiZIDE (GLUCOTROL) 10 MG tablet Take 10 mg by mouth 2 (two) times daily before a meal.    Historical Provider, MD  hydrochlorothiazide (MICROZIDE) 12.5 MG capsule Take 25 mg by mouth daily.     Historical Provider, MD  IRON PO Take 1 tablet by mouth 2 (two) times daily.    Historical Provider, MD  Linaclotide Karlene Einstein(LINZESS) 290 MCG CAPS capsule Take 290 mcg by mouth daily.    Historical Provider, MD  linagliptin (TRADJENTA) 5 MG TABS tablet Take 5 mg by mouth daily.    Historical Provider, MD  metFORMIN (GLUCOPHAGE) 1000 MG tablet Take 1,000 mg by mouth 2 (two) times daily with a meal. Reported on 05/07/2015  Historical Provider, MD  norethindrone (NOR-QD) 0.35 MG tablet Take 1 tablet by mouth daily. Reported on 06/04/2015    Historical Provider, MD  potassium chloride (KLOR-CON) 20 MEQ packet Take 20 mEq by mouth 2 (two) times daily. 05/06/14   Loleta Rose, MD  sitaGLIPtin (JANUVIA) 100 MG tablet Take 100 mg by mouth daily. Reported on 05/07/2015    Historical Provider, MD    Allergies  Allergen Reactions  . Lac Bovis     Other reaction(s): Vomiting    No family history on file.  Social History Social History  Substance Use Topics  . Smoking status: Never Smoker  . Smokeless tobacco: Never Used  . Alcohol use No    Review of  Systems Constitutional: Negative for fever. Cardiovascular: Intermittent chest pain Respiratory: Intermittent shortness of breath Gastrointestinal: Negative for abdominal pain Genitourinary: Negative for dysuria. Musculoskeletal: Negative for back pain. Skin: Negative for rash. Neurological: Negative for headaches, focal weakness or numbness. 10-point ROS otherwise negative.  ____________________________________________   PHYSICAL EXAM:  VITAL SIGNS: ED Triage Vitals  Enc Vitals Group     BP 08/11/15 1909 (!) 159/95     Pulse Rate 08/11/15 1909 100     Resp 08/11/15 1909 20     Temp 08/11/15 1909 98.7 F (37.1 C)     Temp Source 08/11/15 1909 Oral     SpO2 08/11/15 1909 96 %     Weight 08/11/15 1909 245 lb (111.1 kg)     Height 08/11/15 1909 5\' 10"  (1.778 m)     Head Circumference --      Peak Flow --      Pain Score 08/11/15 1910 10     Pain Loc --      Pain Edu? --      Excl. in GC? --     Constitutional: Alert and oriented. Well appearing and in no distress. Eyes: Normal exam ENT   Head: Normocephalic and atraumatic   Mouth/Throat: Mucous membranes are moist. Cardiovascular: Normal rate, regular rhythm. No murmur Respiratory: Normal respiratory effort without tachypnea nor retractions. Breath sounds are clear Gastrointestinal: Soft and nontender. No distention. Musculoskeletal: Nontender with normal range of motion in all extremities. No lower extremity tenderness or edema. Neurologic:  Normal speech and language. No gross focal neurologic deficits are appreciated. Skin:  Skin is warm, dry and intact.  Psychiatric: Mood and affect are normal. Speech and behavior are normal.   ____________________________________________    EKG  EKG reviewed and interpreted by myself shows normal sinus rhythm at 97 bpm, narrow QRS, normal axis, normal intervals, nonspecific ST changes. No ST elevations.  ____________________________________________     RADIOLOGY  Chest x-ray negative  ____________________________________________   INITIAL IMPRESSION / ASSESSMENT AND PLAN / ED COURSE  Pertinent labs & imaging results that were available during my care of the patient were reviewed by me and considered in my medical decision making (see chart for details).  Patient presents for 2-3 days of intermittent chest pain with intermittent shortness of breath as well as pain all over her body. Patient not sure if it is all fibromyalgia-related. Patient's labs including troponin are normal. She does have a mild hypokalemia but states she is currently taking potassium supplements for this. We will discharge with a short course of Ultram, 10 tablets. I discussed with the patient that after this we would not be able to dispense any pain medication for the patient and she needs to see her pain clinic. Patient is agreeable to  plan.  ____________________________________________   FINAL CLINICAL IMPRESSION(S) / ED DIAGNOSES  Chest pain Fibromyalgia    Minna Antis, MD 08/11/15 2029

## 2015-08-11 NOTE — ED Notes (Signed)
Dr. Lenard LancePaduchowski informed of critical lab value, potassium 2.7

## 2015-08-11 NOTE — Discharge Instructions (Signed)
You have been seen in the emergency department today for chest pain. Your workup has shown normal results. As we discussed please follow-up with your primary care physician in the next 1-2 days for recheck. Return to the emergency department for any further chest pain, trouble breathing, or any other symptom personally concerning to yourself. °

## 2015-08-11 NOTE — ED Triage Notes (Signed)
Pt comes in c/o "pain all over" reports fibromyalgia and degenerative disc disease. Reports that she was having a lot of indigestion last night and having a lot of burning in her chest while laying down last night but today started to have dull heaviness in her left chest with shooting pains. Can't say if it's radiating anywhere because she is having pain all over her body. Reports some dizziness occasionally.

## 2015-08-31 ENCOUNTER — Telehealth: Payer: Self-pay | Admitting: *Deleted

## 2015-09-07 ENCOUNTER — Ambulatory Visit: Payer: Self-pay | Admitting: Pain Medicine

## 2016-05-01 ENCOUNTER — Emergency Department: Payer: Medicaid Other

## 2016-05-01 ENCOUNTER — Encounter: Payer: Self-pay | Admitting: Emergency Medicine

## 2016-05-01 ENCOUNTER — Emergency Department
Admission: EM | Admit: 2016-05-01 | Discharge: 2016-05-01 | Disposition: A | Discharge status: Home / Self Care | Payer: Medicaid Other | Attending: Emergency Medicine | Admitting: Emergency Medicine

## 2016-05-01 DIAGNOSIS — Y929 Unspecified place or not applicable: Secondary | ICD-10-CM | POA: Insufficient documentation

## 2016-05-01 DIAGNOSIS — M25512 Pain in left shoulder: Secondary | ICD-10-CM | POA: Diagnosis not present

## 2016-05-01 DIAGNOSIS — Y9389 Activity, other specified: Secondary | ICD-10-CM | POA: Diagnosis not present

## 2016-05-01 DIAGNOSIS — Z7984 Long term (current) use of oral hypoglycemic drugs: Secondary | ICD-10-CM | POA: Diagnosis not present

## 2016-05-01 DIAGNOSIS — Y990 Civilian activity done for income or pay: Secondary | ICD-10-CM | POA: Diagnosis not present

## 2016-05-01 DIAGNOSIS — Z79899 Other long term (current) drug therapy: Secondary | ICD-10-CM | POA: Diagnosis not present

## 2016-05-01 DIAGNOSIS — X500XXA Overexertion from strenuous movement or load, initial encounter: Secondary | ICD-10-CM | POA: Insufficient documentation

## 2016-05-01 DIAGNOSIS — I10 Essential (primary) hypertension: Secondary | ICD-10-CM | POA: Diagnosis not present

## 2016-05-01 DIAGNOSIS — E119 Type 2 diabetes mellitus without complications: Secondary | ICD-10-CM | POA: Diagnosis not present

## 2016-05-01 MED ORDER — MELOXICAM 7.5 MG PO TABS: 7.5000 mg | ORAL_TABLET | Freq: Every day | ORAL | 1 refills | Status: AC

## 2016-05-01 NOTE — ED Notes (Signed)
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

## 2016-05-01 NOTE — ED Provider Notes (Signed)
[facility] Emergency Department Provider Note  ____________________________________________  Time seen: Approximately 6:52 PM  I have reviewed the triage vital signs and the nursing notes.   HISTORY  Chief Complaint Shoulder Pain    HPI Connie Whitney is a 36 y.o. female with a history of fibromyalgia presents to the emergency department with a 10/10 aching left shoulder pain that started yesterday. Patient states that she had a weekend of heavy lifting at her job. Patient denies neck pain or radiculopathy. Patient states that she has experienced some left upper extremity avoidance. She denies prior traumas or surgeries affecting the left upper extremity. She denies chest pain, chest tightness, shortness of breath, nausea, vomiting or abdominal pain. Patient states that she has presented to the emergency department "for x-rays" and "just to make sure everything is okay". No alleviating measures haven't undertaken.   Past Medical History:  Diagnosis Date  . Anemia   . Diabetes mellitus without complication (HCC)   . Hypertension   . Lupus   . Migraines   . Neuropathy   . Vitamin D deficiency     Patient Active Problem List   Diagnosis Date Noted  . Back pain at L4-L5 level 04/12/2015  . DDD (degenerative disc disease), lumbar 04/12/2015  . Lumbar radiculopathy 04/12/2015    Past Surgical History:  Procedure Laterality Date  . CESAREAN SECTION    . DILATION AND CURETTAGE, DIAGNOSTIC / THERAPEUTIC    . HYSTEROSCOPY WITH NOVASURE N/A 08/21/2014   Procedure: HYSTEROSCOPY WITH NOVASURE;  Surgeon: Foster Bing, MD;  Location: [facility] ORS;  Service: Gynecology;  Laterality: N/A;  . TUBAL LIGATION      Prior to Admission medications   Medication Sig Start Date End Date Taking? Authorizing Provider  AMITRIPTYLINE HCL PO Take 10 mg by mouth 1 day or 1 dose. Takes as needed    Historical Provider, MD  amLODipine-benazepril (LOTREL) 10-40 MG capsule Take 1  capsule by mouth daily. Reported on 06/04/2015    Historical Provider, MD  carvedilol (COREG) 12.5 MG tablet Take 25 mg by mouth 2 (two) times daily with a meal.     Historical Provider, MD  cyclobenzaprine (FLEXERIL) 5 MG tablet Take 1 tablet (5 mg total) by mouth every 8 (eight) hours as needed for muscle spasms. 06/06/14   Jenise V Bacon Menshew, PA-C  glipiZIDE (GLUCOTROL) 10 MG tablet Take 10 mg by mouth 2 (two) times daily before a meal.    Historical Provider, MD  hydrochlorothiazide (MICROZIDE) 12.5 MG capsule Take 25 mg by mouth daily.     Historical Provider, MD  IRON PO Take 1 tablet by mouth 2 (two) times daily.    Historical Provider, MD  Linaclotide Karlene Einstein) 290 MCG CAPS capsule Take 290 mcg by mouth daily.    Historical Provider, MD  linagliptin (TRADJENTA) 5 MG TABS tablet Take 5 mg by mouth daily.    Historical Provider, MD  meloxicam (MOBIC) 7.5 MG tablet Take 1 tablet (7.5 mg total) by mouth daily. 05/01/16 05/08/16  Orvil Feil, PA-C  metFORMIN (GLUCOPHAGE) 1000 MG tablet Take 1,000 mg by mouth 2 (two) times daily with a meal. Reported on 05/07/2015    Historical Provider, MD  potassium chloride (KLOR-CON) 20 MEQ packet Take 20 mEq by mouth 2 (two) times daily. 05/06/14   Loleta Rose, MD  traMADol (ULTRAM) 50 MG tablet Take 1 tablet (50 mg total) by mouth every 6 (six) hours as needed. 08/11/15   Minna Antis, MD  Allergies Lac bovis  No family history on file.  Social History Social History  Substance Use Topics  . Smoking status: Never Smoker  . Smokeless tobacco: Never Used  . Alcohol use No     Review of Systems  Constitutional: No fever/chills Eyes: No visual changes. No discharge ENT: No upper respiratory complaints. Cardiovascular: no chest pain. Respiratory: no cough. No SOB. Gastrointestinal: No abdominal pain.  No nausea, no vomiting.  No diarrhea.  No constipation. Musculoskeletal: Patient has left shoulder pain. Skin: Negative for rash, abrasions,  lacerations, ecchymosis. Neurological: Negative for headaches, focal weakness or numbness.   ____________________________________________   PHYSICAL EXAM:  VITAL SIGNS: ED Triage Vitals  Enc Vitals Group     BP 05/01/16 1807 (!) 150/89     Pulse Rate 05/01/16 1807 92     Resp 05/01/16 1807 20     Temp 05/01/16 1807 99.1 F (37.3 C)     Temp Source 05/01/16 1807 Oral     SpO2 05/01/16 1807 100 %     Weight 05/01/16 1806 250 lb (113.4 kg)     Height 05/01/16 1806  (1.778 m)     Head Circumference --      Peak Flow --      Pain Score 05/01/16 1813 10     Pain Loc --      Pain Edu? --      Excl. in GC? --     Constitutional: Alert and oriented. Well appearing and in no acute distress. Eyes: Conjunctivae are normal. PERRL. EOMI. Head: Atraumatic. ENT:      Ears: Tympanic membranes are pearly bilaterally.      Nose: No congestion/rhinnorhea.      Mouth/Throat: Mucous membranes are moist.  Neck: No stridor. Full range of motion. No radiculopathy was elicited with range of motion at the neck. Cardiovascular: Normal rate, regular rhythm. Normal S1 and S2.  Good peripheral circulation. Respiratory: Normal respiratory effort without tachypnea or retractions. Lungs CTAB. Good air entry to the bases with no decreased or absent breath sounds. Gastrointestinal: Bowel sounds 4 quadrants. Soft and nontender to palpation. No guarding or rigidity. No palpable masses. No distention. No CVA tenderness. Musculoskeletal: Patient has 5 out of 5 strength in the upper extremities bilaterally. She has limited range of motion at the left shoulder. Patient has full range of motion at the left elbow and left wrist. Patient has pain elicited with left supraspinatus testing but no weakness. Palpable radial and ulnar pulses bilaterally and symmetrically. Neurologic:  Normal speech and language. No gross focal neurologic deficits are appreciated. Reflexes are 2+ and symmetric in the upper extremities  bilaterally. Skin:  Skin is warm, dry and intact. No rash noted.  Psychiatric: Mood and affect are normal. Speech and behavior are normal. Patient exhibits appropriate insight and judgement. ____________________________________________   LABS (all labs ordered are listed, but only abnormal results are displayed)  Labs Reviewed - No data to display ____________________________________________  EKG   ____________________________________________  RADIOLOGY Geraldo Pitter, personally viewed and evaluated these images (plain radiographs) as part of my medical decision making, as well as reviewing the written report by the radiologist.    Dg Shoulder Left  Result Date: 05/01/2016 CLINICAL DATA:  Left shoulder pain after lifting a patient last Monday. EXAM: LEFT SHOULDER - 2+ VIEW COMPARISON:  None. FINDINGS: There is no evidence of fracture or dislocation. The glenohumeral and AC joints are maintained. There is no evidence of arthropathy or other focal  bone abnormality. Soft tissues are unremarkable. IMPRESSION: Negative for acute fracture or dislocation of the left shoulder. Electronically Signed   By: Tollie Eth M.D.   On: 05/01/2016 19:17    ____________________________________________    PROCEDURES  Procedure(s) performed:    Procedures    Medications - No data to display   ____________________________________________   INITIAL IMPRESSION / ASSESSMENT AND PLAN / ED COURSE  Pertinent labs & imaging results that were available during my care of the patient were reviewed by me and considered in my medical decision making (see chart for details).  Review of the [place] CSRS was performed in accordance of the [facility] prior to dispensing any controlled drugs.     Assessment and plan: Left shoulder pain: Patient presents to the emergency department with left shoulder pain for one day. DG left shoulder reveals no acute fractures or bony abnormalities. On physical exam,  patient had pain elicited with supraspinatus testing, increasing concern for rotator cuff tendinitis. Patient was discharged with Mobic. A referral was given orthopedics, Dr.Poggi. Vital signs are reassuring at this time. All patient questions were answered.   ____________________________________________  FINAL CLINICAL IMPRESSION(S) / ED DIAGNOSES  Final diagnoses:  Acute pain of left shoulder      NEW MEDICATIONS STARTED DURING THIS VISIT:  New Prescriptions   MELOXICAM (MOBIC) 7.5 MG TABLET    Take 1 tablet (7.5 mg total) by mouth daily.        This chart was dictated using voice recognition software/Dragon. Despite best efforts to proofread, errors can occur which can change the meaning. Any change was purely unintentional.    Orvil Feil, PA-C 05/01/16 1925    Phineas Semen, MD 05/01/16 2104

## 2016-05-01 NOTE — ED Triage Notes (Signed)
Pt with left shoulder pain and LROM started yesterday. 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

## 2016-08-02 ENCOUNTER — Ambulatory Visit: Payer: Medicaid Other | Admitting: Anesthesiology

## 2016-08-02 ENCOUNTER — Encounter
Admission: RE | Disposition: A | Discharge status: Home / Self Care | Payer: Self-pay | Source: Ambulatory Visit | Attending: Unknown Physician Specialty

## 2016-08-02 ENCOUNTER — Ambulatory Visit
Admission: RE | Admit: 2016-08-02 | Discharge: 2016-08-02 | Disposition: A | Discharge status: Home / Self Care | Payer: Medicaid Other | Source: Ambulatory Visit | Attending: Unknown Physician Specialty | Admitting: Unknown Physician Specialty

## 2016-08-02 ENCOUNTER — Encounter: Payer: Self-pay | Admitting: Anesthesiology

## 2016-08-02 DIAGNOSIS — Z6835 Body mass index (BMI) 35.0-35.9, adult: Secondary | ICD-10-CM | POA: Insufficient documentation

## 2016-08-02 DIAGNOSIS — Z79899 Other long term (current) drug therapy: Secondary | ICD-10-CM | POA: Diagnosis not present

## 2016-08-02 DIAGNOSIS — Z91011 Allergy to milk products: Secondary | ICD-10-CM | POA: Diagnosis not present

## 2016-08-02 DIAGNOSIS — K210 Gastro-esophageal reflux disease with esophagitis: Secondary | ICD-10-CM | POA: Diagnosis not present

## 2016-08-02 DIAGNOSIS — K2950 Unspecified chronic gastritis without bleeding: Secondary | ICD-10-CM | POA: Insufficient documentation

## 2016-08-02 DIAGNOSIS — M329 Systemic lupus erythematosus, unspecified: Secondary | ICD-10-CM | POA: Diagnosis not present

## 2016-08-02 DIAGNOSIS — E119 Type 2 diabetes mellitus without complications: Secondary | ICD-10-CM | POA: Diagnosis not present

## 2016-08-02 DIAGNOSIS — Z7984 Long term (current) use of oral hypoglycemic drugs: Secondary | ICD-10-CM | POA: Diagnosis not present

## 2016-08-02 DIAGNOSIS — M797 Fibromyalgia: Secondary | ICD-10-CM | POA: Insufficient documentation

## 2016-08-02 DIAGNOSIS — I10 Essential (primary) hypertension: Secondary | ICD-10-CM | POA: Diagnosis not present

## 2016-08-02 DIAGNOSIS — M1990 Unspecified osteoarthritis, unspecified site: Secondary | ICD-10-CM | POA: Diagnosis not present

## 2016-08-02 DIAGNOSIS — Z01818 Encounter for other preprocedural examination: Secondary | ICD-10-CM | POA: Diagnosis present

## 2016-08-02 DIAGNOSIS — K2980 Duodenitis without bleeding: Secondary | ICD-10-CM | POA: Diagnosis not present

## 2016-08-02 HISTORY — DX: Fibromyalgia: M79.7

## 2016-08-02 HISTORY — PX: ESOPHAGOGASTRODUODENOSCOPY (EGD) WITH PROPOFOL: SHX5813

## 2016-08-02 HISTORY — DX: Unspecified osteoarthritis, unspecified site: M19.90

## 2016-08-02 LAB — GLUCOSE, CAPILLARY: GLUCOSE-CAPILLARY: 198 mg/dL — AB (ref 65–99)

## 2016-08-02 LAB — POCT PREGNANCY, URINE: PREG TEST UR: NEGATIVE

## 2016-08-02 SURGERY — ESOPHAGOGASTRODUODENOSCOPY (EGD) WITH PROPOFOL
Anesthesia: General

## 2016-08-02 MED ORDER — PROPOFOL 10 MG/ML IV BOLUS
INTRAVENOUS | Status: DC | PRN
  Administered 2016-08-02: 30 mg via INTRAVENOUS
  Administered 2016-08-02: 20 mg via INTRAVENOUS

## 2016-08-02 MED ORDER — MIDAZOLAM HCL 5 MG/5ML IJ SOLN
INTRAMUSCULAR | Status: DC | PRN
  Administered 2016-08-02: 2 mg via INTRAVENOUS

## 2016-08-02 MED ORDER — PROPOFOL 500 MG/50ML IV EMUL
INTRAVENOUS | Status: DC | PRN
  Administered 2016-08-02: 75 ug/kg/min via INTRAVENOUS

## 2016-08-02 MED ORDER — FENTANYL CITRATE (PF) 100 MCG/2ML IJ SOLN
INTRAMUSCULAR | Status: DC | PRN
  Administered 2016-08-02 (×2): 50 ug via INTRAVENOUS

## 2016-08-02 MED ORDER — GLYCOPYRROLATE 0.2 MG/ML IJ SOLN
INTRAMUSCULAR | Status: AC
  Filled 2016-08-02: qty 1

## 2016-08-02 MED ORDER — SODIUM CHLORIDE 0.9 % IV SOLN: INTRAVENOUS | Status: DC

## 2016-08-02 MED ORDER — SODIUM CHLORIDE 0.9 % IV SOLN
INTRAVENOUS | Status: DC
  Administered 2016-08-02 (×2): via INTRAVENOUS

## 2016-08-02 MED ORDER — MIDAZOLAM HCL 2 MG/2ML IJ SOLN
INTRAMUSCULAR | Status: AC
  Filled 2016-08-02: qty 2

## 2016-08-02 MED ORDER — LIDOCAINE HCL (PF) 2 % IJ SOLN
INTRAMUSCULAR | Status: DC | PRN
  Administered 2016-08-02: 50 mg

## 2016-08-02 MED ORDER — GLYCOPYRROLATE 0.2 MG/ML IJ SOLN
INTRAMUSCULAR | Status: DC | PRN
  Administered 2016-08-02: 0.2 mg via INTRAVENOUS

## 2016-08-02 MED ORDER — FENTANYL CITRATE (PF) 100 MCG/2ML IJ SOLN
INTRAMUSCULAR | Status: AC
  Filled 2016-08-02: qty 2

## 2016-08-02 NOTE — Anesthesia Post-op Follow-up Note (Cosign Needed)
Anesthesia QCDR form completed.        

## 2016-08-02 NOTE — H&P (Signed)
Primary Care Physician:  [facility] Primary Gastroenterologist:  Dr. Mechele CollinElliott  Pre-Procedure History & Physical: HPI:  Connie Whitney is a 36 y.o. female is here for an endoscopy.   Past Medical History:  Diagnosis Date  . Anemia   . Arthritis   . Diabetes mellitus without complication (HCC)   . Fibromyalgia   . Hypertension   . Lupus   . Migraines   . Neuropathy   . Vitamin D deficiency     Past Surgical History:  Procedure Laterality Date  . CESAREAN SECTION    . DILATION AND CURETTAGE, DIAGNOSTIC / THERAPEUTIC    . HYSTEROSCOPY WITH NOVASURE N/A 08/21/2014   Procedure: HYSTEROSCOPY WITH NOVASURE;  Surgeon: Frederick Bingharlie Pickens, MD;  Location: [facility] ORS;  Service: Gynecology;  Laterality: N/A;  . TUBAL LIGATION      Prior to Admission medications   Medication Sig Start Date End Date Taking? Authorizing Provider  AMITRIPTYLINE HCL PO Take 10 mg by mouth 1 day or 1 dose. Takes as needed   Yes [provider]  amLODipine-benazepril (LOTREL) 10-40 MG capsule Take 1 capsule by mouth daily. Reported on 06/04/2015   Yes [provider]  Biotin 5 MG CAPS Take by mouth.   Yes [provider]  carvedilol (COREG) 12.5 MG tablet Take 25 mg by mouth 2 (two) times daily with a meal.    Yes [provider]  cyclobenzaprine (FLEXERIL) 5 MG tablet Take 1 tablet (5 mg total) by mouth every 8 (eight) hours as needed for muscle spasms. 06/06/14  Yes Menshew, Charlesetta IvoryJenise V Bacon, PA-C  gabapentin (NEURONTIN) 300 MG capsule Take 300 mg by mouth 3 (three) times daily.   Yes [provider]  glipiZIDE (GLUCOTROL) 10 MG tablet Take 10 mg by mouth 2 (two) times daily before a meal.   Yes [provider]  hydrochlorothiazide (HYDRODIURIL) 25 MG tablet Take 25 mg by mouth daily.   Yes [provider]  IRON PO Take 1 tablet by mouth 2 (two) times daily.   Yes [provider]  Linaclotide (LINZESS) 290 MCG CAPS capsule Take  290 mcg by mouth daily.   Yes [provider]  linagliptin (TRADJENTA) 5 MG TABS tablet Take 5 mg by mouth daily.   Yes [provider]  metFORMIN (GLUCOPHAGE) 1000 MG tablet Take 1,000 mg by mouth 2 (two) times daily with a meal. Reported on 05/07/2015   Yes [provider]  potassium chloride (KLOR-CON) 20 MEQ packet Take 20 mEq by mouth 2 (two) times daily. 05/06/14  Yes Loleta RoseForbach, Cory, MD  topiramate (TOPAMAX) 25 MG tablet Take 25 mg by mouth 2 (two) times daily.   Yes [provider]  glipiZIDE (GLUCOTROL) 5 MG tablet Take 10 mg by mouth daily before breakfast.    [provider]  glipiZIDE (GLUCOTROL) 5 MG tablet Take 5 mg by mouth daily before supper.    [provider]  hydrochlorothiazide (MICROZIDE) 12.5 MG capsule Take 25 mg by mouth daily.     [provider]  potassium chloride (K-DUR,KLOR-CON) 10 MEQ tablet Take 10 mEq by mouth 2 (two) times daily.    [provider]  traMADol (ULTRAM) 50 MG tablet Take 1 tablet (50 mg total) by mouth every 6 (six) hours as needed. Patient not taking: Reported on 08/02/2016 08/11/15   Minna AntisPaduchowski, Kevin, MD    Allergies as of 07/19/2016 - Review Complete 05/01/2016  Allergen Reaction Noted  . Lac bovis  05/10/2015  History reviewed. No pertinent family history.  Social History   Social History  . Marital status: Single    Spouse name: N/A  . Number of children: N/A  . Years of education: N/A   Occupational History  . Not on file.   Social History Main Topics  . Smoking status: Never Smoker  . Smokeless tobacco: Never Used  . Alcohol use No  . Drug use: No  . Sexual activity: No   Other Topics Concern  . Not on file   Social History Narrative  . No narrative on file    Review of Systems: See HPI, otherwise negative ROS  Physical Exam: BP (!) 146/83   Pulse 91   Temp (!) 97.5 F (36.4 C) (Tympanic)   Resp 18   Ht 5\' 10"  (1.778 m)   Wt 112.9 kg (249 lb)    SpO2 97%   BMI 35.73 kg/m  General:   Alert,  pleasant and cooperative in NAD Head:  Normocephalic and atraumatic. Neck:  Supple; no masses or thyromegaly. Lungs:  Clear throughout to auscultation.    Heart:  Regular rate and rhythm. Abdomen:  Soft, nontender and nondistended. Normal bowel sounds, without guarding, and without rebound.   Neurologic:  Alert and  oriented x4;  grossly normal neurologically.  Impression/Plan: Connie Whitney is here for an endoscopy to be performed for evaluation prior to having gastric bypass type surgery for weight loss.  Risks, benefits, limitations, and alternatives regarding  endoscopy have been reviewed with the patient.  Questions have been answered.  All parties agreeable.   Lynnae PrudeELLIOTT, ROBERT, MD  08/02/2016, 12:51 PM

## 2016-08-02 NOTE — Anesthesia Preprocedure Evaluation (Signed)
Anesthesia Evaluation  Patient identified by MRN, date of birth, ID band Patient awake    Reviewed: Allergy & Precautions, NPO status , Patient's Chart, lab work & pertinent test results, reviewed documented beta blocker date and time   Airway Mallampati: III  TM Distance: >3 FB     Dental  (+) Chipped   Pulmonary           Cardiovascular hypertension, Pt. on medications      Neuro/Psych  Headaches,  Neuromuscular disease    GI/Hepatic   Endo/Other  diabetes, Type 2  Renal/GU      Musculoskeletal  (+) Arthritis ,   Abdominal   Peds  Hematology  (+) anemia ,   Anesthesia Other Findings Lupus.  Reproductive/Obstetrics                             Anesthesia Physical Anesthesia Plan  ASA: III  Anesthesia Plan: General   Post-op Pain Management:    Induction: Intravenous  PONV Risk Score and Plan:   Airway Management Planned:   Additional Equipment:   Intra-op Plan:   Post-operative Plan:   Informed Consent: I have reviewed the patients History and Physical, chart, labs and discussed the procedure including the risks, benefits and alternatives for the proposed anesthesia with the patient or authorized representative who has indicated his/her understanding and acceptance.     Plan Discussed with: CRNA  Anesthesia Plan Comments:         Anesthesia Quick Evaluation

## 2016-08-02 NOTE — Op Note (Addendum)
[facility] Gastroenterology Patient Name: Connie Whitney Procedure Date: 08/02/2016 11:30 AM MRN: [id number] Account #: [id number] Date of Birth: 1980/07/11 Admit Type: Outpatient Age: [age] [facility]OM 3 Gender: Female Note Status: Supervisor Override Procedure:            Upper GI endoscopy Indications:          Preoperative assessment for bariatric surgery to treat                        morbid obesity Providers:            Scot Junobert T. Maloni Musleh, MD Referring MD:         Jeris PentaElena M. Adamo (Referring MD) Medicines:            Propofol per Anesthesia Complications:        No immediate complications. Procedure:            Pre-Anesthesia Assessment:                       - After reviewing the risks and benefits, the patient                        was deemed in satisfactory condition to undergo the                        procedure.                       After obtaining informed consent, the endoscope was                        passed under direct vision. Throughout the procedure,                        the patient's blood pressure, pulse, and oxygen                        saturations were monitored continuously. The Endoscope                        was introduced through the mouth, and advanced to the                        second part of duodenum. The upper GI endoscopy was                        accomplished without difficulty. The patient tolerated                        the procedure well. Findings:      LA Grade A (one or more mucosal breaks less than 5 mm, not extending       between tops of 2 mucosal folds) esophagitis with no bleeding was found       39 cm from the incisors. Biopsies were taken with a cold forceps for       histology.      The entire examined stomach was normal. Biopsies were taken with a cold       forceps for histology. Biopsies were taken with a cold forceps for       Helicobacter pylori testing.  A few medium mucosal nodules with  a localized distribution were found in       the duodenal bulb. Duodenal nodular hyperplasia in the proximal duodenal       bulb. Biopsies were taken with a cold forceps for histology. Impression:           - LA Grade A reflux esophagitis. Rule out Barrett's                        esophagus. Biopsied.                       - Normal stomach. Biopsied.                       - Mucosal nodule found in the duodenum. Biopsied. Recommendation:       - Await pathology results. Scot Junobert T [name], MD 08/02/2016 1:14:07 PM This report has been signed electronically. Number of Addenda: 0 Note Initiated On: 08/02/2016 11:30 AM      [facility]

## 2016-08-02 NOTE — Transfer of Care (Signed)
Immediate Anesthesia Transfer of Care Note  Patient: Connie Whitney  Procedure(s) Performed: Procedure(s): ESOPHAGOGASTRODUODENOSCOPY (EGD) WITH PROPOFOL (N/A)  Patient Location: PACU  Anesthesia Type:General  Level of Consciousness: sedated  Airway & Oxygen Therapy: Patient Spontanous Breathing and Patient connected to nasal cannula oxygen  Post-op Assessment: Report given to RN and Post -op Vital signs reviewed and stable  Post vital signs: Reviewed and stable  Last Vitals:  Vitals:   08/02/16 1046 08/02/16 1314  BP: (!) 146/83 110/70  Pulse: 91 83  Resp: 18 (!) 28  Temp: (!) 36.4 C (!) 35.7 C    Last Pain:  Vitals:   08/02/16 1314  TempSrc: Tympanic         Complications: No apparent anesthesia complications

## 2016-08-02 NOTE — Anesthesia Postprocedure Evaluation (Signed)
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  Procedure(s) Performed: Procedure(s) (LRB): ESOPHAGOGASTRODUODENOSCOPY (EGD) WITH PROPOFOL (N/A)  Patient location during evaluation: Endoscopy Anesthesia Type: General Level of consciousness: awake and alert Pain management: pain level controlled Vital Signs Assessment: post-procedure vital signs reviewed and stable Respiratory status: spontaneous breathing, nonlabored ventilation, respiratory function stable and patient connected to nasal cannula oxygen Cardiovascular status: blood pressure returned to baseline and stable Postop Assessment: no signs of nausea or vomiting Anesthetic complications: no     Last Vitals:  Vitals:   08/02/16 1046 08/02/16 1314  BP: (!) 146/83 110/70  Pulse: 91 83  Resp: 18 (!) 28  Temp: (!) 36.4 C (!) 35.7 C    Last Pain:  Vitals:   08/02/16 1314  TempSrc: Tympanic                 Aimee Timmons S

## 2016-08-03 ENCOUNTER — Encounter: Payer: Self-pay | Admitting: Unknown Physician Specialty

## 2016-08-03 LAB — SURGICAL PATHOLOGY

## 2016-10-21 ENCOUNTER — Encounter: Payer: Self-pay | Admitting: Emergency Medicine

## 2016-10-21 ENCOUNTER — Emergency Department
Admission: EM | Admit: 2016-10-21 | Discharge: 2016-10-21 | Disposition: A | Discharge status: Home / Self Care | Payer: Medicaid Other | Attending: Emergency Medicine | Admitting: Emergency Medicine

## 2016-10-21 ENCOUNTER — Emergency Department: Payer: Medicaid Other

## 2016-10-21 DIAGNOSIS — I10 Essential (primary) hypertension: Secondary | ICD-10-CM | POA: Insufficient documentation

## 2016-10-21 DIAGNOSIS — E119 Type 2 diabetes mellitus without complications: Secondary | ICD-10-CM | POA: Insufficient documentation

## 2016-10-21 DIAGNOSIS — Z7984 Long term (current) use of oral hypoglycemic drugs: Secondary | ICD-10-CM | POA: Diagnosis not present

## 2016-10-21 DIAGNOSIS — Y929 Unspecified place or not applicable: Secondary | ICD-10-CM | POA: Diagnosis not present

## 2016-10-21 DIAGNOSIS — Z79899 Other long term (current) drug therapy: Secondary | ICD-10-CM | POA: Diagnosis not present

## 2016-10-21 DIAGNOSIS — Y998 Other external cause status: Secondary | ICD-10-CM | POA: Insufficient documentation

## 2016-10-21 DIAGNOSIS — W010XXA Fall on same level from slipping, tripping and stumbling without subsequent striking against object, initial encounter: Secondary | ICD-10-CM | POA: Diagnosis not present

## 2016-10-21 DIAGNOSIS — S99911A Unspecified injury of right ankle, initial encounter: Secondary | ICD-10-CM | POA: Diagnosis present

## 2016-10-21 DIAGNOSIS — S82434A Nondisplaced oblique fracture of shaft of right fibula, initial encounter for closed fracture: Secondary | ICD-10-CM | POA: Diagnosis not present

## 2016-10-21 DIAGNOSIS — S82401A Unspecified fracture of shaft of right fibula, initial encounter for closed fracture: Secondary | ICD-10-CM

## 2016-10-21 DIAGNOSIS — Y9389 Activity, other specified: Secondary | ICD-10-CM | POA: Insufficient documentation

## 2016-10-21 MED ORDER — IBUPROFEN 800 MG PO TABS: 800.0000 mg | ORAL_TABLET | Freq: Three times a day (TID) | ORAL | 0 refills | Status: DC | PRN

## 2016-10-21 MED ORDER — TRAMADOL HCL 50 MG PO TABS: 50.0000 mg | ORAL_TABLET | Freq: Four times a day (QID) | ORAL | 0 refills | Status: AC | PRN

## 2016-10-21 MED ORDER — IBUPROFEN 600 MG PO TABS
600.0000 mg | ORAL_TABLET | Freq: Once | ORAL | Status: AC
  Administered 2016-10-21: 600 mg via ORAL
  Filled 2016-10-21 (×2): qty 1

## 2016-10-21 MED ORDER — IBUPROFEN 600 MG PO TABS: 600.0000 mg | ORAL_TABLET | Freq: Three times a day (TID) | ORAL | 0 refills | Status: DC | PRN

## 2016-10-21 MED ORDER — TRAMADOL HCL 50 MG PO TABS
50.0000 mg | ORAL_TABLET | Freq: Once | ORAL | Status: AC
  Administered 2016-10-21: 50 mg via ORAL
  Filled 2016-10-21: qty 1

## 2016-10-21 NOTE — Discharge Instructions (Signed)
Wear splint to ambulate with support and to the evaluation by orthopedics Department. Call today to schedule appointment.

## 2016-10-21 NOTE — ED Provider Notes (Signed)
[facility] Emergency Department Provider Note   ____________________________________________   First MD Initiated Contact with Patient 10/21/16 [phone]     (approximate)  I have reviewed the triage vital signs and the nursing notes.   HISTORY  Chief Complaint Ankle Pain    HPI Connie Peteriffany Whitney is a 36 y.o. female patient complaining of right lateral ankle pain secondary to a fall yesterday. Patient states she felt a "pop" to the right ankle. Patient stated pain increases weightbearing that she's noticed some mild swelling to the lateral aspect of the ankle. Patient rates pain as 8/10. Patient described a pain as "achy". No palliative measures for complaint.Patient advises ED she is under pain management.   Past Medical History:  Diagnosis Date  . Anemia   . Arthritis   . Diabetes mellitus without complication (HCC)   . Fibromyalgia   . Hypertension   . Lupus   . Migraines   . Neuropathy   . Vitamin D deficiency     Patient Active Problem List   Diagnosis Date Noted  . Back pain at L4-L5 level 04/12/2015  . DDD (degenerative disc disease), lumbar 04/12/2015  . Lumbar radiculopathy 04/12/2015    Past Surgical History:  Procedure Laterality Date  . CESAREAN SECTION    . DILATION AND CURETTAGE, DIAGNOSTIC / THERAPEUTIC    . ESOPHAGOGASTRODUODENOSCOPY (EGD) WITH PROPOFOL N/A 08/02/2016   Procedure: ESOPHAGOGASTRODUODENOSCOPY (EGD) WITH PROPOFOL;  Surgeon: Scot JunElliott, Robert T, MD;  [facility];  Service: Endoscopy;  Laterality: N/A;  . HYSTEROSCOPY WITH NOVASURE N/A 08/21/2014   Procedure: HYSTEROSCOPY WITH NOVASURE;  Surgeon: [place] Bingharlie Pickens, MD;  Location: [facility] ORS;  Service: Gynecology;  Laterality: N/A;  . TUBAL LIGATION      Prior to Admission medications   Medication Sig Start Date End Date Taking? Authorizing Provider  AMITRIPTYLINE HCL PO Take 10 mg by mouth 1 day or 1 dose. Takes as needed    [provider]    amLODipine-benazepril (LOTREL) 10-40 MG capsule Take 1 capsule by mouth daily. Reported on 06/04/2015    [provider]  Biotin 5 MG CAPS Take by mouth.    [provider]  carvedilol (COREG) 12.5 MG tablet Take 25 mg by mouth 2 (two) times daily with a meal.     [provider]  cyclobenzaprine (FLEXERIL) 5 MG tablet Take 1 tablet (5 mg total) by mouth every 8 (eight) hours as needed for muscle spasms. 06/06/14   Menshew, Charlesetta IvoryJenise V Bacon, PA-C  gabapentin (NEURONTIN) 300 MG capsule Take 300 mg by mouth 3 (three) times daily.    [provider]  glipiZIDE (GLUCOTROL) 10 MG tablet Take 10 mg by mouth 2 (two) times daily before a meal.    [provider]  glipiZIDE (GLUCOTROL) 5 MG tablet Take 10 mg by mouth daily before breakfast.    [provider]  glipiZIDE (GLUCOTROL) 5 MG tablet Take 5 mg by mouth daily before supper.    [provider]  hydrochlorothiazide (HYDRODIURIL) 25 MG tablet Take 25 mg by mouth daily.    [provider]  hydrochlorothiazide (MICROZIDE) 12.5 MG capsule Take 25 mg by mouth daily.     [provider]  ibuprofen (ADVIL,MOTRIN) 600 MG tablet Take 1 tablet (600 mg total) by mouth every 8 (eight) hours as needed. 10/21/16   Joni ReiningSmith, Tyreck Bell K, PA-C  ibuprofen (ADVIL,MOTRIN) 800 MG tablet Take 1 tablet (800 mg total) by mouth every 8 (eight) hours as needed. 10/21/16  Joni Reining, PA-C  IRON PO Take 1 tablet by mouth 2 (two) times daily.    [provider]  Linaclotide (LINZESS) 290 MCG CAPS capsule Take 290 mcg by mouth daily.    [provider]  linagliptin (TRADJENTA) 5 MG TABS tablet Take 5 mg by mouth daily.    [provider]  metFORMIN (GLUCOPHAGE) 1000 MG tablet Take 1,000 mg by mouth 2 (two) times daily with a meal. Reported on 05/07/2015    [provider]  potassium chloride (K-DUR,KLOR-CON) 10 MEQ tablet Take 10 mEq by mouth 2 (two) times daily.     [provider]  potassium chloride (KLOR-CON) 20 MEQ packet Take 20 mEq by mouth 2 (two) times daily. 05/06/14   Loleta Rose, MD  topiramate (TOPAMAX) 25 MG tablet Take 25 mg by mouth 2 (two) times daily.    [provider]  traMADol (ULTRAM) 50 MG tablet Take 1 tablet (50 mg total) by mouth every 6 (six) hours as needed. Patient not taking: Reported on 08/02/2016 08/11/15   Minna Antis, MD  traMADol (ULTRAM) 50 MG tablet Take 1 tablet (50 mg total) by mouth every 6 (six) hours as needed. 10/21/16 10/21/17  Joni Reining, PA-C    Allergies Lac bovis; Milk-related compounds; Pravastatin; and Venlafaxine  No family history on file.  Social History Social History  Substance Use Topics  . Smoking status: Never Smoker  . Smokeless tobacco: Never Used  . Alcohol use No    Review of Systems  Constitutional: No fever/chills Eyes: No visual changes. ENT: No sore throat. Cardiovascular: Denies chest pain. Respiratory: Denies shortness of breath. Gastrointestinal: No abdominal pain.  No nausea, no vomiting.  No diarrhea.  No constipation. Genitourinary: Negative for dysuria. Musculoskeletal: Right ankle pain  Skin: Negative for rash. Neurological: Negative for headaches, focal weakness or numbness. Endocrine:Diabetes, hypertension, and lupus Allergic/Immunilogical: See medication list ____________________________________________   PHYSICAL EXAM:  VITAL SIGNS: ED Triage Vitals  Enc Vitals Group     BP 10/21/16 0928 118/77     Pulse Rate 10/21/16 0928 88     Resp 10/21/16 0928 20     Temp 10/21/16 0928 99.6 F (37.6 C)     Temp Source 10/21/16 0928 Oral     SpO2 10/21/16 0928 99 %     Weight 10/21/16 0926 240 lb (108.9 kg)     Height 10/21/16 0926 5\' 10"  (1.778 m)     Head Circumference --      Peak Flow --      Pain Score 10/21/16 0928 8     Pain Loc --      Pain Edu? --      Excl. in GC? --     Constitutional: Alert and oriented. Well appearing  and in no acute distress. Cardiovascular: Normal rate, regular rhythm. Grossly normal heart sounds.  Good peripheral circulation. Respiratory: Normal respiratory effort.  No retractions. Lungs CTAB. Musculoskeletal: No obvious deformity or obvious edema to the lateral aspect of the right ankle.  Neurologic:  Normal speech and language. No gross focal neurologic deficits are appreciated. No gait instability. Skin:  Skin is warm, dry and intact. No rash noted. Psychiatric: Mood and affect are normal. Speech and behavior are normal.  ____________________________________________   LABS (all labs ordered are listed, but only abnormal results are displayed)  Labs Reviewed - No data to display ____________________________________________  EKG   ____________________________________________  RADIOLOGY  Dg Ankle Complete Right  Result Date: 10/21/2016 CLINICAL  DATA:  Acute right ankle pain following fall yesterday. Initial encounter. EXAM: RIGHT ANKLE - COMPLETE 3+ VIEW COMPARISON:  None. FINDINGS: A nondisplaced oblique fracture of the distal fibula is noted. No subluxation or dislocation. No other bony abnormalities are identified. Lateral soft tissue swelling is present. IMPRESSION: Nondisplaced oblique fracture of the distal fibula. Electronically Signed   By: Harmon Pier M.D.   On: 10/21/2016 09:57    ____________________________________________   PROCEDURES  Procedure(s) performed: None  Procedures  Critical Care performed: No  ____________________________________________   INITIAL IMPRESSION / ASSESSMENT AND PLAN / ED COURSE  As part of my medical decision making, I reviewed the following data within the electronic MEDICAL RECORD NUMBER Notes from prior ED visits and [place] Controlled Substance Database   Patient presents with right ankle pain secondary to a distal fibular fracture. Discussed x-ray finding with patient. Patient given discharge Instructions. Patient placed in [facility]  given crutches for ambulation. Patient advised to contact orthopedic department after departure to schedule appointment for definitive evaluation and treatment. Patient with pain management and state were only except ibuprofen.      ____________________________________________   FINAL CLINICAL IMPRESSION(S) / ED DIAGNOSES  Final diagnoses:  Closed fracture of shaft of right fibula, unspecified fracture morphology, initial encounter      NEW MEDICATIONS STARTED DURING THIS VISIT:  New Prescriptions   IBUPROFEN (ADVIL,MOTRIN) 600 MG TABLET    Take 1 tablet (600 mg total) by mouth every 8 (eight) hours as needed.   IBUPROFEN (ADVIL,MOTRIN) 800 MG TABLET    Take 1 tablet (800 mg total) by mouth every 8 (eight) hours as needed.   TRAMADOL (ULTRAM) 50 MG TABLET    Take 1 tablet (50 mg total) by mouth every 6 (six) hours as needed.     Note:  This document was prepared using Dragon voice recognition software and may include unintentional dictation errors.    Joni Reining, PA-C 10/21/16 1006    Dionne Bucy, MD 10/21/16 1216

## 2016-10-21 NOTE — ED Triage Notes (Signed)
Pt reports she fell yesterday and felt a pop to right ankle, mild swelling noted.

## 2016-10-21 NOTE — ED Notes (Signed)
Returned from xray

## 2016-10-21 NOTE — ED Notes (Signed)
NAD noted at time of D/C. Pt taken to lobby via wheelchair. Pt d/c into the care of her friend. Pt denies any comments/concerns at this time.

## 2017-02-27 ENCOUNTER — Other Ambulatory Visit: Payer: Self-pay

## 2017-02-27 ENCOUNTER — Emergency Department
Admission: EM | Admit: 2017-02-27 | Discharge: 2017-02-28 | Disposition: A | Discharge status: Home / Self Care | Payer: Medicaid Other | Attending: Emergency Medicine | Admitting: Emergency Medicine

## 2017-02-27 ENCOUNTER — Emergency Department: Payer: Medicaid Other

## 2017-02-27 ENCOUNTER — Encounter: Payer: Self-pay | Admitting: Physician Assistant

## 2017-02-27 DIAGNOSIS — Y999 Unspecified external cause status: Secondary | ICD-10-CM | POA: Diagnosis not present

## 2017-02-27 DIAGNOSIS — M545 Low back pain: Secondary | ICD-10-CM | POA: Diagnosis present

## 2017-02-27 DIAGNOSIS — Z7984 Long term (current) use of oral hypoglycemic drugs: Secondary | ICD-10-CM | POA: Insufficient documentation

## 2017-02-27 DIAGNOSIS — E1140 Type 2 diabetes mellitus with diabetic neuropathy, unspecified: Secondary | ICD-10-CM | POA: Insufficient documentation

## 2017-02-27 DIAGNOSIS — Z79899 Other long term (current) drug therapy: Secondary | ICD-10-CM | POA: Insufficient documentation

## 2017-02-27 DIAGNOSIS — Y92410 Unspecified street and highway as the place of occurrence of the external cause: Secondary | ICD-10-CM | POA: Insufficient documentation

## 2017-02-27 DIAGNOSIS — M25561 Pain in right knee: Secondary | ICD-10-CM | POA: Insufficient documentation

## 2017-02-27 DIAGNOSIS — Y9389 Activity, other specified: Secondary | ICD-10-CM | POA: Insufficient documentation

## 2017-02-27 DIAGNOSIS — M7918 Myalgia, other site: Secondary | ICD-10-CM

## 2017-02-27 DIAGNOSIS — R0789 Other chest pain: Secondary | ICD-10-CM | POA: Diagnosis not present

## 2017-02-27 DIAGNOSIS — E119 Type 2 diabetes mellitus without complications: Secondary | ICD-10-CM | POA: Diagnosis not present

## 2017-02-27 DIAGNOSIS — I10 Essential (primary) hypertension: Secondary | ICD-10-CM | POA: Insufficient documentation

## 2017-02-27 MED ORDER — MELOXICAM 15 MG PO TABS: 15.0000 mg | ORAL_TABLET | Freq: Every day | ORAL | 2 refills | Status: AC

## 2017-02-27 MED ORDER — BACLOFEN 10 MG PO TABS: 10.0000 mg | ORAL_TABLET | Freq: Every day | ORAL | 1 refills | Status: AC
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
## 2017-02-27 NOTE — Discharge Instructions (Signed)
Follow-up with your regular doctor if not better in 5-7 days.  Take medication as prescribed.  Apply ice to any areas that hurt.  Get up and move around frequently as this will keep you from becoming stiff.  Return to the emergency department if you are worsening

## 2017-09-12 ENCOUNTER — Other Ambulatory Visit: Payer: Self-pay

## 2017-09-12 ENCOUNTER — Encounter: Payer: Self-pay | Admitting: Emergency Medicine

## 2017-09-12 ENCOUNTER — Emergency Department
Admission: EM | Admit: 2017-09-12 | Discharge: 2017-09-12 | Disposition: A | Discharge status: Home / Self Care | Payer: Medicaid Other | Attending: Emergency Medicine | Admitting: Emergency Medicine

## 2017-09-12 ENCOUNTER — Emergency Department: Payer: Medicaid Other

## 2017-09-12 DIAGNOSIS — Z7984 Long term (current) use of oral hypoglycemic drugs: Secondary | ICD-10-CM | POA: Insufficient documentation

## 2017-09-12 DIAGNOSIS — E119 Type 2 diabetes mellitus without complications: Secondary | ICD-10-CM | POA: Insufficient documentation

## 2017-09-12 DIAGNOSIS — Z79899 Other long term (current) drug therapy: Secondary | ICD-10-CM | POA: Insufficient documentation

## 2017-09-12 DIAGNOSIS — M3210 Systemic lupus erythematosus, organ or system involvement unspecified: Secondary | ICD-10-CM | POA: Insufficient documentation

## 2017-09-12 DIAGNOSIS — R0789 Other chest pain: Secondary | ICD-10-CM | POA: Insufficient documentation

## 2017-09-12 DIAGNOSIS — I10 Essential (primary) hypertension: Secondary | ICD-10-CM | POA: Insufficient documentation

## 2017-09-12 LAB — CBC
HCT: 32.6 % — ABNORMAL LOW (ref 35.0–47.0)
Hemoglobin: 11 g/dL — ABNORMAL LOW (ref 12.0–16.0)
MCH: 27.8 pg (ref 26.0–34.0)
MCHC: 33.8 g/dL (ref 32.0–36.0)
MCV: 82.3 fL (ref 80.0–100.0)
PLATELETS: 311 10*3/uL (ref 150–440)
RBC: 3.96 MIL/uL (ref 3.80–5.20)
RDW: 14.2 % (ref 11.5–14.5)
WBC: 5 10*3/uL (ref 3.6–11.0)

## 2017-09-12 LAB — BASIC METABOLIC PANEL
Anion gap: 6 (ref 5–15)
BUN: 10 mg/dL (ref 6–20)
CALCIUM: 8.7 mg/dL — AB (ref 8.9–10.3)
CO2: 29 mmol/L (ref 22–32)
CREATININE: 0.83 mg/dL (ref 0.44–1.00)
Chloride: 103 mmol/L (ref 98–111)
GFR calc non Af Amer: >60 mL/min (ref >60)
Glucose, Bld: 187 mg/dL — ABNORMAL HIGH (ref 70–99)
Potassium: 2.6 mmol/L — CL (ref 3.5–5.1)
SODIUM: 138 mmol/L (ref 135–145)

## 2017-09-12 LAB — TROPONIN I

## 2017-09-12 MED ORDER — RANITIDINE HCL 150 MG PO CAPS: 150.0000 mg | ORAL_CAPSULE | Freq: Two times a day (BID) | ORAL | 0 refills | Status: DC

## 2017-09-12 MED ORDER — ACETAMINOPHEN 325 MG PO TABS: 650.0000 mg | ORAL_TABLET | Freq: Four times a day (QID) | ORAL | 0 refills | Status: DC | PRN

## 2017-09-12 NOTE — ED Provider Notes (Signed)
[facility] Emergency Department Provider Note  ____________________________________________  Time seen: Approximately 5:15 PM  I have reviewed the triage vital signs and the nursing notes.   HISTORY  Chief Complaint Chest Pain    HPI Connie Whitney is a 37 y.o. female with a history of hypertension, lupus, gastric bypass surgery, resolved diabetes who sent to the ED for evaluation today of chest pain.  Gradual onset in the center of the chest, nonradiating.  No associated nausea vomiting diaphoresis dizziness or shortness of breath.  Feels dull.  Not exertional, not pleuritic.  No specific aggravating or alleviating factors.  Not affected by food.  It has been constant for the past 2 days.  No cough fevers chills or sore throat.      Past Medical History:  Diagnosis Date  . Anemia   . Arthritis   . Diabetes mellitus without complication (HCC)   . Fibromyalgia   . Hypertension   . Lupus (HCC)   . Migraines   . Neuropathy   . Vitamin D deficiency      Patient Active Problem List   Diagnosis Date Noted  . Back pain at L4-L5 level 04/12/2015  . DDD (degenerative disc disease), lumbar 04/12/2015  . Lumbar radiculopathy 04/12/2015     Past Surgical History:  Procedure Laterality Date  . CESAREAN SECTION    . DILATION AND CURETTAGE, DIAGNOSTIC / THERAPEUTIC    . ESOPHAGOGASTRODUODENOSCOPY (EGD) WITH PROPOFOL N/A 08/02/2016   Procedure: ESOPHAGOGASTRODUODENOSCOPY (EGD) WITH PROPOFOL;  Surgeon: Scot Jun, MD;  Location: [facility] ENDOSCOPY;  Service: Endoscopy;  Laterality: N/A;  . HYSTEROSCOPY WITH NOVASURE N/A 08/21/2014   Procedure: HYSTEROSCOPY WITH NOVASURE;  Surgeon: [place] Bing, MD;  Location: [facility] ORS;  Service: Gynecology;  Laterality: N/A;  . TUBAL LIGATION       Prior to Admission medications   Medication Sig Start Date End Date Taking? Authorizing Provider  acetaminophen (TYLENOL) 325 MG tablet Take 2 tablets (650 mg total) by  mouth every 6 (six) hours as needed. 09/12/17   Sharman Cheek, MD  AMITRIPTYLINE HCL PO Take 10 mg by mouth 1 day or 1 dose. Takes as needed    [provider]  amLODipine-benazepril (LOTREL) 10-40 MG capsule Take 1 capsule by mouth daily. Reported on 06/04/2015    [provider]  baclofen (LIORESAL) 10 MG tablet Take 1 tablet (10 mg total) by mouth daily. 02/27/17 02/27/18  Faythe Ghee, PA-C  Biotin 5 MG CAPS Take by mouth.    [provider]  carvedilol (COREG) 12.5 MG tablet Take 25 mg by mouth 2 (two) times daily with a meal.     [provider]  gabapentin (NEURONTIN) 300 MG capsule Take 300 mg by mouth 3 (three) times daily.    [provider]  glipiZIDE (GLUCOTROL) 10 MG tablet Take 10 mg by mouth 2 (two) times daily before a meal.    [provider]  glipiZIDE (GLUCOTROL) 5 MG tablet Take 10 mg by mouth daily before breakfast.    [provider]  glipiZIDE (GLUCOTROL) 5 MG tablet Take 5 mg by mouth daily before supper.    [provider]  hydrochlorothiazide (HYDRODIURIL) 25 MG tablet Take 25 mg by mouth daily.    [provider]  hydrochlorothiazide (MICROZIDE) 12.5 MG capsule Take 25 mg by mouth daily.     [provider]  IRON PO Take 1 tablet by mouth 2 (two) times daily.    [provider]  Linaclotide (LINZESS) 290 MCG CAPS capsule Take 290 mcg by mouth daily.    [provider]  linagliptin (TRADJENTA) 5 MG TABS tablet Take 5 mg by mouth daily.    [provider]  meloxicam (MOBIC) 15 MG tablet Take 1 tablet (15 mg total) by mouth daily. 02/27/17 02/27/18  Sherrie Mustache Roselyn Bering, PA-C  metFORMIN (GLUCOPHAGE) 1000 MG tablet Take 1,000 mg by mouth 2 (two) times daily with a meal. Reported on 05/07/2015    [provider]  potassium chloride (K-DUR,KLOR-CON) 10 MEQ tablet Take 10 mEq by mouth 2 (two) times daily.    [provider]  potassium chloride  (KLOR-CON) 20 MEQ packet Take 20 mEq by mouth 2 (two) times daily. 05/06/14   Loleta Rose, MD  ranitidine (ZANTAC) 150 MG capsule Take 1 capsule (150 mg total) by mouth 2 (two) times daily. 09/12/17   Sharman Cheek, MD  topiramate (TOPAMAX) 25 MG tablet Take 25 mg by mouth 2 (two) times daily.    [provider]  traMADol (ULTRAM) 50 MG tablet Take 1 tablet (50 mg total) by mouth every 6 (six) hours as needed. Patient not taking: Reported on 08/02/2016 08/11/15   Minna Antis, MD  traMADol (ULTRAM) 50 MG tablet Take 1 tablet (50 mg total) by mouth every 6 (six) hours as needed. 10/21/16 10/21/17  Joni Reining, PA-C     Allergies Lac bovis; Milk-related compounds; Pravastatin; and Venlafaxine   No family history on file.  Social History Social History   Tobacco Use  . Smoking status: Never Smoker  . Smokeless tobacco: Never Used  Substance Use Topics  . Alcohol use: No  . Drug use: No    Review of Systems  Constitutional:   No fever or chills.  ENT:   No sore throat. No rhinorrhea. Cardiovascular:   Positive as above chest pain without syncope. Respiratory:   No dyspnea or cough. Gastrointestinal:   Negative for abdominal pain, vomiting and diarrhea.  Musculoskeletal:   Negative for focal pain or swelling All other systems reviewed and are negative except as documented above in ROS and HPI.  ____________________________________________   PHYSICAL EXAM:  VITAL SIGNS: ED Triage Vitals [09/12/17 1500]  Enc Vitals Group     BP (!) 156/76     Pulse Rate 80     Resp 16     Temp 99.2 F (37.3 C)     Temp Source Oral     SpO2 100 %     Weight 189 lb (85.7 kg)     Height 5\' 10"  (1.778 m)     Head Circumference      Peak Flow      Pain Score 6     Pain Loc      Pain Edu?      Excl. in GC?     Vital signs reviewed, nursing assessments reviewed.   Constitutional:   Alert and oriented. Non-toxic appearance. Eyes:   Conjunctivae are normal. EOMI.  PERRL. ENT      Head:   Normocephalic and atraumatic.      Nose:   No congestion/rhinnorhea.       Mouth/Throat:   MMM, no pharyngeal erythema. No peritonsillar mass.       Neck:   No meningismus. Full ROM. Hematological/Lymphatic/Immunilogical:   No cervical lymphadenopathy. Cardiovascular:   RRR. Symmetric bilateral radial and DP pulses.  No murmurs. Cap refill less than 2 seconds. Respiratory:   Normal respiratory effort without tachypnea/retractions. Breath sounds  are clear and equal bilaterally. No wheezes/rales/rhonchi. Gastrointestinal:   Soft and nontender. Non distended. There is no CVA tenderness.  No rebound, rigidity, or guarding. Musculoskeletal:   Normal range of motion in all extremities. No joint effusions.  No lower extremity tenderness.  No edema.  Anterior chest wall markedly tender over the sternum reproducing her symptoms. Neurologic:   Normal speech and language.  Motor grossly intact. No acute focal neurologic deficits are appreciated.  Skin:    Skin is warm, dry and intact. No rash noted.  No petechiae, purpura, or bullae.  ____________________________________________    LABS (pertinent positives/negatives) (all labs ordered are listed, but only abnormal results are displayed) Labs Reviewed  BASIC METABOLIC PANEL - Abnormal; Notable for the following components:      Result Value   Potassium 2.6 (*)    Glucose, Bld 187 (*)    Calcium 8.7 (*)    All other components within normal limits  CBC - Abnormal; Notable for the following components:   Hemoglobin 11.0 (*)    HCT 32.6 (*)    All other components within normal limits  TROPONIN I   ____________________________________________   EKG  Interpreted by me Normal sinus rhythm rate of 81, normal axis and intervals.  Normal QRS ST segments and T waves. Unchanged from 2 years ago. ____________________________________________    RADIOLOGY  Dg Chest 2 View  Result Date: 09/12/2017 CLINICAL DATA:   Midsternal chest pain that started Sunday EXAM: CHEST - 2 VIEW COMPARISON:  February 27, 2017 FINDINGS: The heart size and mediastinal contours are within normal limits. There is no focal infiltrate, pulmonary edema, or pleural effusion. Minimal linear atelectasis is noted in both lung bases. The visualized skeletal structures are unremarkable. IMPRESSION: No active cardiopulmonary disease. Minimal linear atelectasis is identified in both lung bases. Electronically Signed   By: Wei-Chen  Lin M.D.   On: 09/12/2017 15:31    ____________________________________________   PROCEDURES Procedures  ____________________________________________  DIFFERENTIAL DIAGNOSIS   Costochondritis, chest wall strain, MSK pain.  Doubt ACS PE dissection AAA pneumothorax pericarditis or pneumonia.  CLINICAL IMPRESSION / ASSESSMENT AND PLAN / ED COURSE  Pertinent labs & imaging results that were available during my care of the patient were reviewed by me and considered in my medical decision making (see chart for details).    Patient not in distress, well-appearing, vital signs unremarkable except for hypertension.  Presents with atypical chest pain, not worrisome at this time.  Exam is consistent with chest wall pain/costochondritis.  Due to her prior gastric bypass surgery, plan to treat with Tylenol and Zantac, follow-up with primary care.  EKG is unchanged from previous, labs are normal.      ____________________________________________   FINAL CLINICAL IMPRESSION(S) / ED DIAGNOSES    Final diagnoses:  Atypical chest pain  Chest wall pain     ED Discharge Orders         Ordered    acetaminophen (TYLENOL) 325 MG tablet  Every 6 hours PRN     09/12/17 1715    ranitidine (ZANTAC) 150 MG capsule  2 times daily     09 /11/19 1715          Portions of this note were generated with dragon dictation software. Dictation errors may occur despite best attempts at proofreading.    Sharman Cheek,  MD 09/12/17 [phone]

## 2017-09-12 NOTE — ED Notes (Signed)
Pt c/o CP 5/10 that radiates to R/side of chest. Pt is sitting up on phone and talking to family member. A&OX4. Family at bedside.

## 2017-09-12 NOTE — ED Notes (Addendum)
Dr. Scotty Court aware of patient's hypertension, states okay for D/C. This RN also addressed pt's K, per MD, no treatment.

## 2017-09-12 NOTE — ED Triage Notes (Signed)
Pt here with c/o intermittant midsternal cp that began on Sunday while she was resting. Does not radiate, no shob, appears in NAD.

## 2018-03-06 ENCOUNTER — Ambulatory Visit
Admission: RE | Admit: 2018-03-06 | Discharge: 2018-03-06 | Disposition: A | Discharge status: Home / Self Care | Payer: 59 | Source: Ambulatory Visit | Attending: Nurse Practitioner | Admitting: Nurse Practitioner

## 2018-03-06 ENCOUNTER — Other Ambulatory Visit: Payer: Self-pay

## 2018-03-06 ENCOUNTER — Encounter: Payer: Self-pay | Admitting: Nurse Practitioner

## 2018-03-06 ENCOUNTER — Ambulatory Visit: Payer: 59 | Admitting: Nurse Practitioner

## 2018-03-06 VITALS — BP 164/108 | HR 85 | Temp 98.7°F | Resp 18 | Ht 70.0 in | Wt 204.9 lb

## 2018-03-06 DIAGNOSIS — M899 Disorder of bone, unspecified: Secondary | ICD-10-CM | POA: Insufficient documentation

## 2018-03-06 DIAGNOSIS — G894 Chronic pain syndrome: Secondary | ICD-10-CM

## 2018-03-06 DIAGNOSIS — M25511 Pain in right shoulder: Secondary | ICD-10-CM

## 2018-03-06 DIAGNOSIS — G8929 Other chronic pain: Secondary | ICD-10-CM | POA: Insufficient documentation

## 2018-03-06 DIAGNOSIS — M25551 Pain in right hip: Principal | ICD-10-CM

## 2018-03-06 DIAGNOSIS — M25571 Pain in right ankle and joints of right foot: Secondary | ICD-10-CM | POA: Insufficient documentation

## 2018-03-06 DIAGNOSIS — M5441 Lumbago with sciatica, right side: Secondary | ICD-10-CM

## 2018-03-06 DIAGNOSIS — Z789 Other specified health status: Secondary | ICD-10-CM | POA: Insufficient documentation

## 2018-03-06 DIAGNOSIS — Z79899 Other long term (current) drug therapy: Secondary | ICD-10-CM

## 2018-03-06 DIAGNOSIS — M79604 Pain in right leg: Secondary | ICD-10-CM

## 2018-03-06 NOTE — Progress Notes (Signed)
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 profile.  Membrane stabilizer: To be determined at a later time Muscle relaxant: To be determined at a later time NSAID: To be determined at a later time Other analgesic(s): To be determined at a later time   Interventional therapies under consideration: Connie Whitney was informed that there is no guarantee that she would be a candidate for interventional therapies. The decision will be based on the results of diagnostic studies, as well as Connie Whitney risk profile.  Possible procedure(s): Diagnostic right intra-articular hip injection Diagnostic midline lumbar epidural steroid injection Diagnostic bilateral lumbar facet nerve block Possible bilateral lumbar facet radiofrequency ablation   Provider-requested follow-up: Return for 2nd Visit, w/ Dr. Dossie Arbour, medical record release.  Future Appointments  Date Time Provider [place]  04/01/2018 10:30 AM Milinda Pointer, MD [facility] None    Primary Care Physician: [facility] [place] [facility] Management Facility Note by:  Date: 03/06/2018; Time: 4:27 PM  Pain Score Disclaimer: We use the NRS-11 scale. This is a self-reported, subjective measurement of pain severity with only modest accuracy. It is used primarily to identify changes within a particular patient. It must  be understood that outpatient pain scales are significantly less accurate that those used for research, where they can be applied under ideal controlled circumstances with minimal exposure to variables. In reality, the score is likely to be a combination of pain intensity and pain affect, where pain affect describes the degree of emotional arousal or changes in action readiness caused by the sensory experience of pain. Factors such as social and work situation, setting, emotional state, anxiety levels, expectation, and prior pain experience may influence pain perception and show large inter-individual differences that may also be affected by time variables.  Patient instructions provided during this appointment: Patient Instructions   ____________________________________________________________________________________________  Appointment Policy Summary  It is our goal and responsibility to provide the medical community with assistance in the evaluation and management of patients with chronic pain. Unfortunately our resources are limited. Because we do not have an unlimited amount of time, or available appointments, we are required to closely monitor and manage their use. The following rules exist to maximize their use:  Patient's responsibilities: 1. Punctuality:  At what time should I arrive? You should be physically present in our office 30 minutes before your scheduled appointment. Your scheduled appointment is with your assigned healthcare provider. However, it takes 5-10 minutes to be "checked-in", and another 15 minutes for the nurses to do the admission. If you arrive to our office at the time you were given for  your appointment, you will end up being at least 20-25 minutes late to your appointment with the provider. 2. Tardiness:  What happens if I arrive only a few minutes after my scheduled appointment time? You will need to reschedule your appointment. The cutoff is your appointment time. This is why it is so important that you arrive at least 30 minutes before that appointment. If you have an appointment scheduled for 10:00 AM and you arrive at 10:01, you will be required to reschedule your appointment.  3. Plan ahead:  Always assume that you will encounter traffic on your way in. Plan for it. If you are dependent on a driver, make sure they understand these rules and the need to arrive early. 4. Other appointments and responsibilities:  Avoid scheduling any other appointments before or after your pain clinic appointments.  5. Be prepared:  Write down everything that you need to discuss with your healthcare provider and give this information to the admitting nurse. Write down the medications that you will need refilled. Bring your pills and bottles (even the empty ones), to all of your appointments, except for those where a procedure is scheduled. 6. No children or pets:  Find someone to take care of them. It is not appropriate to bring them in. 7. Scheduling changes:  We request "advanced notification" of any changes or cancellations. 8. Advanced notification:  Defined as a time period of more than 24 hours prior to the originally scheduled appointment. This allows for the appointment to be offered to other patients. 9. Rescheduling:  When a visit is rescheduled, it will require the cancellation of the original appointment. For this reason they both fall within the category of "Cancellations".  10. Cancellations:  They require advanced notification. Any cancellation less than 24 hours before the  appointment will be recorded as a "No Show". 11. No Show:  Defined as an unkept appointment where the  patient failed to notify or declare to the practice their intention or inability to keep the appointment.  Corrective process for repeat offenders:  1. Tardiness: Three (  3) episodes of rescheduling due to late arrivals will be recorded as one (1) "No Show". 2. Cancellation or reschedule: Three (3) cancellations or rescheduling will be recorded as one (1) "No Show". 3. "No Shows": Three (3) "No Shows" within a 12 month period will result in discharge from the practice. ____________________________________________________________________________________________   ______________________________________________________________________________________________  Specialty Pain Scale  Introduction:  There are significant differences in how pain is reported. The word pain usually refers to physical pain, but it is also a common synonym of suffering. The medical community uses a scale from 0 (zero) to 10 (ten) to report pain level. Zero (0) is described as "no pain", while ten (10) is described as "the worse pain you can imagine". The problem with this scale is that physical pain is reported along with suffering. Suffering refers to mental pain, or more often yet it refers to any unpleasant feeling, emotion or aversion associated with the perception of harm or threat of harm. It is the psychological component of pain.  Pain Specialists prefer to separate the two components. The pain scale used by this practice is the Verbal Numerical Rating Scale (VNRS-11). This scale is for the physical pain only. DO NOT INCLUDE how your pain psychologically affects you. This scale is for adults 76 years of age and older. It has 11 (eleven) levels. The 1st level is 0/10. This means: "right now, I have no pain". In the context of pain management, it also means: "right now, my physical pain is under control with the current therapy".  General Information:  The scale should reflect your current level of pain. Unless you are  specifically asked for the level of your worst pain, or your average pain. If you are asked for one of these two, then it should be understood that it is over the past 24 hours.  Levels 1 (one) through 5 (five) are described below, and can be treated as an outpatient. Ambulatory pain management facilities such as ours are more than adequate to treat these levels. Levels 6 (six) through 10 (ten) are also described below, however, these must be treated as a hospitalized patient. While levels 6 (six) and 7 (seven) may be evaluated at an urgent care facility, levels 8 (eight) through 10 (ten) constitute medical emergencies and as such, they belong in a hospital's emergency department. When having these levels (as described below), do not come to our office. Our facility is not equipped to manage these levels. Go directly to an urgent care facility or an emergency department to be evaluated.  Definitions:  Activities of Daily Living (ADL): Activities of daily living (ADL or ADLs) is a term used in healthcare to refer to people's daily self-care activities. Health professionals often use a person's ability or inability to perform ADLs as a measurement of their functional status, particularly in regard to people post injury, with disabilities and the elderly. There are two ADL levels: Basic and Instrumental. Basic Activities of Daily Living (BADL  or BADLs) consist of self-care tasks that include: Bathing and showering; personal hygiene and grooming (including brushing/combing/styling hair); dressing; Toilet hygiene (getting to the toilet, cleaning oneself, and getting back up); eating and self-feeding (not including cooking or chewing and swallowing); functional mobility, often referred to as "transferring", as measured by the ability to walk, get in and out of bed, and get into and out of a chair; the broader definition (moving from one place to another while performing activities) is useful for people with  different physical abilities  who are still able to get around independently. Basic ADLs include the things many people do when they get up in the morning and get ready to go out of the house: get out of bed, go to the toilet, bathe, dress, groom, and eat. On the average, loss of function typically follows a particular order. Hygiene is the first to go, followed by loss of toilet use and locomotion. The last to go is the ability to eat. When there is only one remaining area in which the person is independent, there is a 62.9% chance that it is eating and only a 3.5% chance that it is hygiene. Instrumental Activities of Daily Living (IADL or IADLs) are not necessary for fundamental functioning, but they let an individual live independently in a community. IADL consist of tasks that include: cleaning and maintaining the house; home establishment and maintenance; care of others (including selecting and supervising caregivers); care of pets; child rearing; managing money; managing financials (investments, etc.); meal preparation and cleanup; shopping for groceries and necessities; moving within the community; safety procedures and emergency responses; health management and maintenance (taking prescribed medications); and using the telephone or other form of communication.  Instructions:  Most patients tend to report their pain as a combination of two factors, their physical pain and their psychosocial pain. This last one is also known as "suffering" and it is reflection of how physical pain affects you socially and psychologically. From now on, report them separately.  From this point on, when asked to report your pain level, report only your physical pain. Use the following table for reference.  Pain Clinic Pain Levels (0-5/10)  Pain Level Score  Description  No Pain 0   Mild pain 1 Nagging, annoying, but does not interfere with basic activities of daily living (ADL). Patients are able to eat, bathe, get  dressed, toileting (being able to get on and off the toilet and perform personal hygiene functions), transfer (move in and out of bed or a chair without assistance), and maintain continence (able to control bladder and bowel functions). Blood pressure and heart rate are unaffected. A normal heart rate for a healthy adult ranges from 60 to 100 bpm (beats per minute).   Mild to moderate pain 2 Noticeable and distracting. Impossible to hide from other people. More frequent flare-ups. Still possible to adapt and function close to normal. It can be very annoying and may have occasional stronger flare-ups. With discipline, patients may get used to it and adapt.   Moderate pain 3 Interferes significantly with activities of daily living (ADL). It becomes difficult to feed, bathe, get dressed, get on and off the toilet or to perform personal hygiene functions. Difficult to get in and out of bed or a chair without assistance. Very distracting. With effort, it can be ignored when deeply involved in activities.   Moderately severe pain 4 Impossible to ignore for more than a few minutes. With effort, patients may still be able to manage work or participate in some social activities. Very difficult to concentrate. Signs of autonomic nervous system discharge are evident: dilated pupils (mydriasis); mild sweating (diaphoresis); sleep interference. Heart rate becomes elevated (>115 bpm). Diastolic blood pressure (lower number) rises above 100 mmHg. Patients find relief in laying down and not moving.   Severe pain 5 Intense and extremely unpleasant. Associated with frowning face and frequent crying. Pain overwhelms the senses.  Ability to do any activity or maintain social relationships becomes significantly limited. Conversation becomes difficult. Pacing back  and forth is common, as getting into a comfortable position is nearly impossible. Pain wakes you up from deep sleep. Physical signs will be obvious: pupillary  dilation; increased sweating; goosebumps; brisk reflexes; cold, clammy hands and feet; nausea, vomiting or dry heaves; loss of appetite; significant sleep disturbance with inability to fall asleep or to remain asleep. When persistent, significant weight loss is observed due to the complete loss of appetite and sleep deprivation.  Blood pressure and heart rate becomes significantly elevated. Caution: If elevated blood pressure triggers a pounding headache associated with blurred vision, then the patient should immediately seek attention at an urgent or emergency care unit, as these may be signs of an impending stroke.    Emergency Department Pain Levels (6-10/10)  Emergency Room Pain 6 Severely limiting. Requires emergency care and should not be seen or managed at an outpatient pain management facility. Communication becomes difficult and requires great effort. Assistance to reach the emergency department may be required. Facial flushing and profuse sweating along with potentially dangerous increases in heart rate and blood pressure will be evident.   Distressing pain 7 Self-care is very difficult. Assistance is required to transport, or use restroom. Assistance to reach the emergency department will be required. Tasks requiring coordination, such as bathing and getting dressed become very difficult.   Disabling pain 8 Self-care is no longer possible. At this level, pain is disabling. The individual is unable to do even the most "basic" activities such as walking, eating, bathing, dressing, transferring to a bed, or toileting. Fine motor skills are lost. It is difficult to think clearly.   Incapacitating pain 9 Pain becomes incapacitating. Thought processing is no longer possible. Difficult to remember your own name. Control of movement and coordination are lost.   The worst pain imaginable 10 At this level, most patients pass out from pain. When this level is reached, collapse of the autonomic nervous  system occurs, leading to a sudden drop in blood pressure and heart rate. This in turn results in a temporary and dramatic drop in blood flow to the brain, leading to a loss of consciousness. Fainting is one of the body's self defense mechanisms. Passing out puts the brain in a calmed state and causes it to shut down for a while, in order to begin the healing process.    Summary: 1.   Refer to this scale when providing [place] with your pain level. 2.   Be accurate and careful when reporting your pain level. This will help with your care. 3.   Over-reporting your pain level will lead to loss of credibility. 4.   Even a level of 1/10 means that there is pain and will be treated at our facility. 5.   High, inaccurate reporting will be documented as "Symptom Exaggeration", leading to loss of credibility and suspicions of possible secondary gains such as obtaining more narcotics, or wanting to appear disabled, for fraudulent reasons. 6.   Only pain levels of 5 or below will be seen at our facility. 7.   Pain levels of 6 and above will be sent to the Emergency Department and the appointment cancelled.  ______________________________________________________________________________________________

## 2018-03-06 NOTE — Progress Notes (Signed)
Safety precautions to be maintained throughout the outpatient stay will include: orient to surroundings, keep bed in low position, maintain call bell within reach at all times, provide assistance with transfer out of bed and ambulation.  

## 2018-03-06 NOTE — Patient Instructions (Signed)

## 2018-03-07 NOTE — Progress Notes (Signed)
Results were reviewed and found to be: mildly abnormal  No acute injury or pathology identified  Review would suggest interventional pain management techniques may be of benefit 

## 2018-03-10 LAB — COMP. METABOLIC PANEL (12)
A/G RATIO: 1.5 (ref 1.2–2.2)
ALK PHOS: 139 IU/L — AB (ref 39–117)
AST: 14 IU/L (ref 0–40)
Albumin: 4.2 g/dL (ref 3.8–4.8)
BILIRUBIN TOTAL: 0.2 mg/dL (ref 0.0–1.2)
BUN/Creatinine Ratio: 12 (ref 9–23)
BUN: 13 mg/dL (ref 6–20)
CALCIUM: 9.5 mg/dL (ref 8.7–10.2)
CHLORIDE: 99 mmol/L (ref 96–106)
Creatinine, Ser: 1.05 mg/dL — ABNORMAL HIGH (ref 0.57–1.00)
GFR calc non Af Amer: 67 mL/min/{1.73_m2} (ref >59)
GFR, EST AFRICAN AMERICAN: 77 mL/min/{1.73_m2} (ref >59)
GLOBULIN, TOTAL: 2.8 g/dL (ref 1.5–4.5)
Glucose: 92 mg/dL (ref 65–99)
POTASSIUM: 3.2 mmol/L — AB (ref 3.5–5.2)
Sodium: 141 mmol/L (ref 134–144)
Total Protein: 7 g/dL (ref 6.0–8.5)

## 2018-03-10 LAB — 25-HYDROXY VITAMIN D LCMS D2+D3
25-Hydroxy, Vitamin D-2: 14 ng/mL
25-Hydroxy, Vitamin D-3: 16 ng/mL
25-Hydroxy, Vitamin D: 30 ng/mL

## 2018-03-10 LAB — C-REACTIVE PROTEIN: CRP: 5 mg/L (ref 0–10)

## 2018-03-10 LAB — SEDIMENTATION RATE: SED RATE: 50 mm/h — AB (ref 0–32)

## 2018-03-10 LAB — MAGNESIUM: MAGNESIUM: 1.5 mg/dL — AB (ref 1.6–2.3)

## 2018-03-10 LAB — VITAMIN B12: Vitamin B-12: >2000 pg/mL — ABNORMAL HIGH (ref 232–1245)

## 2018-03-11 ENCOUNTER — Encounter: Payer: Self-pay | Admitting: Nurse Practitioner

## 2018-03-11 DIAGNOSIS — R700 Elevated erythrocyte sedimentation rate: Secondary | ICD-10-CM | POA: Insufficient documentation

## 2018-03-11 DIAGNOSIS — Z9884 Bariatric surgery status: Secondary | ICD-10-CM | POA: Insufficient documentation

## 2018-03-11 DIAGNOSIS — E876 Hypokalemia: Secondary | ICD-10-CM | POA: Insufficient documentation

## 2018-03-12 LAB — COMPLIANCE DRUG ANALYSIS, UR

## 2018-03-25 ENCOUNTER — Encounter: Payer: Self-pay | Admitting: Emergency Medicine

## 2018-03-25 ENCOUNTER — Emergency Department
Admission: EM | Admit: 2018-03-25 | Discharge: 2018-03-25 | Disposition: A | Discharge status: Home / Self Care | Payer: 59 | Attending: Emergency Medicine | Admitting: Emergency Medicine

## 2018-03-25 ENCOUNTER — Other Ambulatory Visit: Payer: Self-pay

## 2018-03-25 DIAGNOSIS — G8929 Other chronic pain: Secondary | ICD-10-CM | POA: Diagnosis not present

## 2018-03-25 DIAGNOSIS — Z7984 Long term (current) use of oral hypoglycemic drugs: Secondary | ICD-10-CM | POA: Insufficient documentation

## 2018-03-25 DIAGNOSIS — M5442 Lumbago with sciatica, left side: Secondary | ICD-10-CM | POA: Diagnosis not present

## 2018-03-25 DIAGNOSIS — Z79899 Other long term (current) drug therapy: Secondary | ICD-10-CM | POA: Diagnosis not present

## 2018-03-25 DIAGNOSIS — I10 Essential (primary) hypertension: Secondary | ICD-10-CM | POA: Insufficient documentation

## 2018-03-25 DIAGNOSIS — M545 Low back pain: Secondary | ICD-10-CM | POA: Diagnosis present

## 2018-03-25 MED ORDER — PREDNISONE 20 MG PO TABS
60.0000 mg | ORAL_TABLET | Freq: Once | ORAL | Status: AC
  Administered 2018-03-25: 60 mg via ORAL
  Filled 2018-03-25: qty 3

## 2018-03-25 MED ORDER — PREDNISONE 20 MG PO TABS: 20.0000 mg | ORAL_TABLET | Freq: Two times a day (BID) | ORAL | 0 refills | Status: AC

## 2018-03-25 MED ORDER — ORPHENADRINE CITRATE 30 MG/ML IJ SOLN
60.0000 mg | INTRAMUSCULAR | Status: AC
  Administered 2018-03-25: 60 mg via INTRAMUSCULAR
  Filled 2018-03-25: qty 2

## 2018-03-25 MED ORDER — KETOROLAC TROMETHAMINE 30 MG/ML IJ SOLN
30.0000 mg | Freq: Once | INTRAMUSCULAR | Status: AC
  Administered 2018-03-25: 30 mg via INTRAMUSCULAR
  Filled 2018-03-25: qty 1

## 2018-03-25 MED ORDER — ORPHENADRINE CITRATE ER 100 MG PO TB12: 100.0000 mg | ORAL_TABLET | Freq: Two times a day (BID) | ORAL | 0 refills | Status: DC | PRN

## 2018-03-25 NOTE — ED Provider Notes (Signed)
[facility] Emergency Department Provider Note ____________________________________________  Time seen: 1545  I have reviewed the triage vital signs and the nursing notes.  HISTORY  Chief Complaint  Back Pain  HPI Connie Whitney is a 38 y.o. female presents herself to the ED for evaluation of acute on chronic flare of her low back pain.  Patient with a history of lumbar radiculopathy as well as fibromyalgia, hypertension, anemia, arthritis, presents for a flare of her chronic low back pain.  She is under the care of pain management, and currently takes gabapentin, Tylenol, and Flexeril as needed for her symptoms.  She denies any recent injury, accident, trauma, or fall.  She also denies any increase above baseline in terms of neuromuscular deficit or weakness.  Denies any bladder or bowel incontinence at this time, presents now for evaluation of increased pain.  Past Medical History:  Diagnosis Date  . Anemia   . Arthritis   . Fibromyalgia   . Hypertension   . Lupus (HCC)   . Migraines   . Neuropathy   . Vitamin D deficiency     Patient Active Problem List   Diagnosis Date Noted  . Elevated sed rate 03/11/2018  . Low serum potassium level 03/11/2018  . History of gastric bypass 03/11/2018  . Low serum magnesium level 03/11/2018  . Chronic bilateral low back pain with right-sided sciatica (Primary Area of Pain)  03/06/2018  . Chronic pain of right lower extremity (Secondary Area of Pain) 03/06/2018  . Chronic hip pain, right [facility] Area of Pain) 03/06/2018  . Chronic pain of right ankle (Fourth Area of Pain) 03/06/2018  . Chronic right shoulder pain 03/06/2018  . Chronic pain syndrome 03/06/2018  . Pharmacologic therapy 03/06/2018  . Disorder of skeletal system 03/06/2018  . Problems influencing health status 03/06/2018  . Back pain at L4-L5 level 04/12/2015  . DDD (degenerative disc disease), lumbar 04/12/2015  . Lumbar radiculopathy 04/12/2015   . Excessive and frequent menstruation with regular cycle 03/16/2014    Past Surgical History:  Procedure Laterality Date  . BARIATRIC SURGERY    . CESAREAN SECTION    . DILATION AND CURETTAGE, DIAGNOSTIC / THERAPEUTIC    . ESOPHAGOGASTRODUODENOSCOPY (EGD) WITH PROPOFOL N/A 08/02/2016   Procedure: ESOPHAGOGASTRODUODENOSCOPY (EGD) WITH PROPOFOL;  Surgeon: Scot Jun, MD;  Location: [facility] ENDOSCOPY;  Service: Endoscopy;  Laterality: N/A;  . HYSTEROSCOPY WITH NOVASURE N/A 08/21/2014   Procedure: HYSTEROSCOPY WITH NOVASURE;  Surgeon: [place] Bing, MD;  Location: [facility] ORS;  Service: Gynecology;  Laterality: N/A;  . TUBAL LIGATION      Prior to Admission medications   Medication Sig Start Date End Date Taking? Authorizing Provider  acetaminophen (TYLENOL) 325 MG tablet Take 2 tablets (650 mg total) by mouth every 6 (six) hours as needed. 09/12/17   Sharman Cheek, MD  AMITRIPTYLINE HCL PO Take 10 mg by mouth 1 day or 1 dose. Takes as needed    [provider]  Biotin 5 MG CAPS Take by mouth.    [provider]  carvedilol (COREG) 12.5 MG tablet Take 25 mg by mouth 2 (two) times daily with a meal.     [provider]  gabapentin (NEURONTIN) 300 MG capsule Take 300 mg by mouth 3 (three) times daily.    [provider]  IRON PO Take 1 tablet by mouth 2 (two) times daily.    [provider]  Linaclotide (LINZESS) 290 MCG CAPS capsule Take 290 mcg by mouth daily.  [provider]  linagliptin (TRADJENTA) 5 MG TABS tablet Take 5 mg by mouth daily.    [provider]  metFORMIN (GLUCOPHAGE) 1000 MG tablet Take 1,000 mg by mouth 2 (two) times daily with a meal. Reported on 05/07/2015    [provider]  orphenadrine (NORFLEX) 100 MG tablet Take 1 tablet (100 mg total) by mouth 2 (two) times daily as needed for muscle spasms. 03/25/18   Abdulmalik Darco, Charlesetta Ivory, PA-C  potassium chloride (K-DUR,KLOR-CON) 10 MEQ tablet Take 10  mEq by mouth 2 (two) times daily.    [provider]  potassium chloride (KLOR-CON) 20 MEQ packet Take 20 mEq by mouth 2 (two) times daily. 05/06/14   Loleta Rose, MD  predniSONE (DELTASONE) 20 MG tablet Take 1 tablet (20 mg total) by mouth 2 (two) times daily with a meal for 5 days. 03/25/18 03/30/18  Patryk Conant, Charlesetta Ivory, PA-C  topiramate (TOPAMAX) 25 MG tablet Take 25 mg by mouth 2 (two) times daily.    [provider]    Allergies Lac bovis; Milk-related compounds; Pravastatin; and Venlafaxine  History reviewed. No pertinent family history.  Social History Social History   Tobacco Use  . Smoking status: Never Smoker  . Smokeless tobacco: Never Used  Substance Use Topics  . Alcohol use: No  . Drug use: No    Review of Systems  Constitutional: Negative for fever. Cardiovascular: Negative for chest pain. Respiratory: Negative for shortness of breath. Gastrointestinal: Negative for abdominal pain, vomiting and diarrhea. Genitourinary: Negative for dysuria. Musculoskeletal: Positive for back pain. Skin: Negative for rash. Neurological: Negative for headaches, focal weakness or numbness. ____________________________________________  PHYSICAL EXAM:  VITAL SIGNS: ED Triage Vitals  Enc Vitals Group     BP 03/25/18 1519 (!) 141/91     Pulse Rate 03/25/18 1519 67     Resp 03/25/18 1519 18     Temp 03/25/18 1519 97.7 F (36.5 C)     Temp Source 03/25/18 1519 Oral     SpO2 03/25/18 1519 100 %     Weight 03/25/18 1519 197 lb (89.4 kg)     Height 03/25/18 1519 5\' 10"  (1.778 m)     Head Circumference --      Peak Flow --      Pain Score 03/25/18 1518 10     Pain Loc --      Pain Edu? --      Excl. in GC? --     Constitutional: Alert and oriented. Well appearing and in no distress. Head: Normocephalic and atraumatic. Eyes: Conjunctivae are normal. Normal extraocular movements Cardiovascular: Normal rate, regular rhythm. Normal distal  pulses. Respiratory: Normal respiratory effort. No wheezes/rales/rhonchi. Gastrointestinal: Soft and nontender. No distention. No CVA tenderness Musculoskeletal: Spinal alignment without midline tenderness, spasm, deformity, or step-off.  Patient transitions from sit to stand without assistance.  Nontender with normal range of motion in all extremities.  Neurologic: Cranial nerves II through XII grossly intact.  Normal LE DTRs bilaterally.  Negative seated straight leg raise bilaterally.  Normal gait without ataxia. Normal speech and language. No gross focal neurologic deficits are appreciated. Skin:  Skin is warm, dry and intact. No rash noted. Psychiatric: Mood and affect are normal. Patient exhibits appropriate insight and judgment. ____________________________________________  PROCEDURES  Procedures Toradol 30 mg IM Norflex 60 mg IM Prednisone 60 mg PO ____________________________________________  INITIAL IMPRESSION / ASSESSMENT AND PLAN / ED COURSE  Patient with ED evaluation of acute on chronic low back pain.  Patient with a history of fibromyalgia as well as degenerative disc disease, presents with a flare of some left-sided sciatica.  She is without any recent injury, accident, or trauma, is also denied any bladder or bowel incontinence, saddle anesthesia, or foot drop.  She reports some improvement of her symptoms after ED medication ministration.  Patient is discharged at this time with prescriptions for prednisone, Norflex, and referral to her pain management provider.  A work note is provided for 1 day as requested.  Return precautions have been reviewed. ____________________________________________  FINAL CLINICAL IMPRESSION(S) / ED DIAGNOSES  Final diagnoses:  Chronic left-sided low back pain with left-sided sciatica      Lissa Hoard, PA-C 03/25/18 1759    Minna Antis, MD 03/25/18 1818

## 2018-03-25 NOTE — ED Notes (Signed)
See triage note  Presents with lower back pain  States she hash a hx of chronic back pain and goes to pain clinic  States this pain is mainly on the left and movies into left leg  Denies any urinary sx or new injury..states she is currently taking percocet and flexeril

## 2018-03-25 NOTE — Discharge Instructions (Signed)
Your exam is consistent with sciatica and chronic back pain. Take the prescription meds as directed. Follow-up with your provider for ongoing symptoms. Return to the ED as needed.

## 2018-03-25 NOTE — ED Triage Notes (Signed)
Here for lower back pain. Sees pain clinic for chronic nerve/back pain and reports most times has good days but today pain is acting up and needs something to help.  No loss bowel or bladder. No known injury.  Takes tylenol and flexeril at baseline and percocet when not working.  Ambulatory. VSS

## 2018-04-01 ENCOUNTER — Other Ambulatory Visit: Payer: Self-pay

## 2018-04-01 ENCOUNTER — Ambulatory Visit: Payer: 59 | Attending: Pain Medicine | Admitting: Pain Medicine

## 2018-04-01 DIAGNOSIS — G8929 Other chronic pain: Secondary | ICD-10-CM

## 2018-04-01 DIAGNOSIS — M25551 Pain in right hip: Secondary | ICD-10-CM

## 2018-04-01 DIAGNOSIS — M25571 Pain in right ankle and joints of right foot: Secondary | ICD-10-CM

## 2018-04-01 DIAGNOSIS — M5136 Other intervertebral disc degeneration, lumbar region: Secondary | ICD-10-CM

## 2018-04-01 DIAGNOSIS — M25511 Pain in right shoulder: Secondary | ICD-10-CM

## 2018-04-01 DIAGNOSIS — M5441 Lumbago with sciatica, right side: Secondary | ICD-10-CM | POA: Diagnosis not present

## 2018-04-01 DIAGNOSIS — M899 Disorder of bone, unspecified: Secondary | ICD-10-CM

## 2018-04-01 DIAGNOSIS — M792 Neuralgia and neuritis, unspecified: Secondary | ICD-10-CM | POA: Insufficient documentation

## 2018-04-01 DIAGNOSIS — M79604 Pain in right leg: Secondary | ICD-10-CM | POA: Diagnosis not present

## 2018-04-01 DIAGNOSIS — G894 Chronic pain syndrome: Secondary | ICD-10-CM

## 2018-04-01 MED ORDER — OXYCODONE-ACETAMINOPHEN 7.5-325 MG PO TABS: 1.0000 | ORAL_TABLET | Freq: Three times a day (TID) | ORAL | 0 refills | Status: DC | PRN

## 2018-04-01 MED ORDER — GABAPENTIN 300 MG PO CAPS: 300.0000 mg | ORAL_CAPSULE | Freq: Three times a day (TID) | ORAL | 0 refills | Status: DC

## 2018-04-01 MED ORDER — MAGNESIUM 500 MG PO CAPS: 500.0000 mg | ORAL_CAPSULE | Freq: Two times a day (BID) | ORAL | 5 refills | Status: DC

## 2018-04-01 NOTE — Patient Instructions (Signed)
____________________________________________________________________________________________  Medication Rules  Purpose: To inform patients, and their family members, of our rules and regulations.  Applies to: All patients receiving prescriptions (written or electronic).  Pharmacy of record: Pharmacy where electronic prescriptions will be sent. If written prescriptions are taken to a different pharmacy, please inform the nursing staff. The pharmacy listed in the electronic medical record should be the one where you would like electronic prescriptions to be sent.  Electronic prescriptions: In compliance with the [place] Strengthen Opioid Misuse Prevention (STOP) Act of 2017 (Session Law 2017-74/H243), effective January 02, 2018, all controlled substances must be electronically prescribed. Calling prescriptions to the pharmacy will cease to exist.  Prescription refills: Only during scheduled appointments. Applies to all prescriptions.  NOTE: The following applies primarily to controlled substances (Opioid* Pain Medications).   Patient's responsibilities: 1. Pain Pills: Bring all pain pills to every appointment (except for procedure appointments). 2. Pill Bottles: Bring pills in original pharmacy bottle. Always bring the newest bottle. Bring bottle, even if empty. 3. Medication refills: You are responsible for knowing and keeping track of what medications you take and those you need refilled. The day before your appointment: write a list of all prescriptions that need to be refilled. The day of the appointment: give the list to the admitting nurse. Prescriptions will be written only during appointments. No prescriptions will be written on procedure days. If you forget a medication: it will not be "Called in", "Faxed", or "electronically sent". You will need to get another appointment to get these prescribed. No early refills. Do not call asking to have your prescription filled  early. 4. Prescription Accuracy: You are responsible for carefully inspecting your prescriptions before leaving our office. Have the discharge nurse carefully go over each prescription with you, before taking them home. Make sure that your name is accurately spelled, that your address is correct. Check the name and dose of your medication to make sure it is accurate. Check the number of pills, and the written instructions to make sure they are clear and accurate. Make sure that you are given enough medication to last until your next medication refill appointment. 5. Taking Medication: Take medication as prescribed. When it comes to controlled substances, taking less pills or less frequently than prescribed is permitted and encouraged. Never take more pills than instructed. Never take medication more frequently than prescribed.  6. Inform other Doctors: Always inform, all of your healthcare providers, of all the medications you take. 7. Pain Medication from other Providers: You are not allowed to accept any additional pain medication from any other Doctor or Healthcare provider. There are two exceptions to this rule. (see below) In the event that you require additional pain medication, you are responsible for notifying us, as stated below. 8. Medication Agreement: You are responsible for carefully reading and following our Medication Agreement. This must be signed before receiving any prescriptions from our practice. Safely store a copy of your signed Agreement. Violations to the Agreement will result in no further prescriptions. (Additional copies of our Medication Agreement are available upon request.) 9. Laws, Rules, & Regulations: All patients are expected to follow all Federal and State Laws, Statutes, Rules, & Regulations. Ignorance of the Laws does not constitute a valid excuse. The use of any illegal substances is prohibited. 10. Adopted CDC guidelines & recommendations: Target dosing levels will be  at or below 60 MME/day. Use of benzodiazepines** is not recommended.  Exceptions: There are only two exceptions to the rule of not   receiving pain medications from other Healthcare Providers. 1. Exception #1 (Emergencies): In the event of an emergency (i.e.: accident requiring emergency care), you are allowed to receive additional pain medication. However, you are responsible for: As soon as you are able, call our office [phone], at any time of the day or night, and leave a message stating your name, the date and nature of the emergency, and the name and dose of the medication prescribed. In the event that your call is answered by a member of our staff, make sure to document and save the date, time, and the name of the person that took your information.  2. Exception #2 (Planned Surgery): In the event that you are scheduled by another doctor or dentist to have any type of surgery or procedure, you are allowed (for a period no longer than 30 days), to receive additional pain medication, for the acute post-op pain. However, in this case, you are responsible for picking up a copy of our "Post-op Pain Management for Surgeons" handout, and giving it to your surgeon or dentist. This document is available at our office, and does not require an appointment to obtain it. Simply go to our office during business hours (Monday-Thursday from 8:00 AM to 4:00 PM) (Friday 8:00 AM to 12:00 Noon) or if you have a scheduled appointment with us, prior to your surgery, and ask for it by name. In addition, you will need to provide us with your name, name of your surgeon, type of surgery, and date of procedure or surgery.  *Opioid medications include: morphine, codeine, oxycodone, oxymorphone, hydrocodone, hydromorphone, meperidine, tramadol, tapentadol, buprenorphine, fentanyl, methadone. **Benzodiazepine medications include: diazepam (Valium), alprazolam (Xanax), clonazepam (Klonopine), lorazepam (Ativan), clorazepate  (Tranxene), chlordiazepoxide (Librium), estazolam (Prosom), oxazepam (Serax), temazepam (Restoril), triazolam (Halcion) (Last updated: 03/01/2017) ____________________________________________________________________________________________   ____________________________________________________________________________________________  Medication Recommendations and Reminders  Applies to: All patients receiving prescriptions (written and/or electronic).  Medication Rules & Regulations: These rules and regulations exist for your safety and that of others. They are not flexible and neither are we. Dismissing or ignoring them will be considered "non-compliance" with medication therapy, resulting in complete and irreversible termination of such therapy. (See document titled "Medication Rules" for more details.) In all conscience, because of safety reasons, we cannot continue providing a therapy where the patient does not follow instructions.  Pharmacy of record:   Definition: This is the pharmacy where your electronic prescriptions will be sent.   We do not endorse any particular pharmacy.  You are not restricted in your choice of pharmacy.  The pharmacy listed in the electronic medical record should be the one where you want electronic prescriptions to be sent.  If you choose to change pharmacy, simply notify our nursing staff of your choice of new pharmacy.  Recommendations:  Keep all of your pain medications in a safe place, under lock and key, even if you live alone.   After you fill your prescription, take 1 week's worth of pills and put them away in a safe place. You should keep a separate, properly labeled bottle for this purpose. The remainder should be kept in the original bottle. Use this as your primary supply, until it runs out. Once it's gone, then you know that you have 1 week's worth of medicine, and it is time to come in for a prescription refill. If you do this correctly, it  is unlikely that you will ever run out of medicine.  To make sure that the above recommendation works,   it is very important that you make sure your medication refill appointments are scheduled at least 1 week before you run out of medicine. To do this in an effective manner, make sure that you do not leave the office without scheduling your next medication management appointment. Always ask the nursing staff to show you in your prescription , when your medication will be running out. Then arrange for the receptionist to get you a return appointment, at least 7 days before you run out of medicine. Do not wait until you have 1 or 2 pills left, to come in. This is very poor planning and does not take into consideration that we may need to cancel appointments due to bad weather, sickness, or emergencies affecting our staff.  "Partial Fill": If for any reason your pharmacy does not have enough pills/tablets to completely fill or refill your prescription, do not allow for a "partial fill". You will need a separate prescription to fill the remaining amount, which we will not provide. If the reason for the partial fill is your insurance, you will need to talk to the pharmacist about payment alternatives for the remaining tablets, but again, do not accept a partial fill.  Prescription refills and/or changes in medication(s):   Prescription refills, and/or changes in dose or medication, will be conducted only during scheduled medication management appointments. (Applies to both, written and electronic prescriptions.)  No refills on procedure days. No medication will be changed or started on procedure days. No changes, adjustments, and/or refills will be conducted on a procedure day. Doing so will interfere with the diagnostic portion of the procedure.  No phone refills. No medications will be "called into the pharmacy".  No Fax refills.  No weekend refills.  No Holliday refills.  No after hours  refills.  Remember:  Business hours are:  Monday to Thursday 8:00 AM to 4:00 PM Provider's Schedule: Crystal King, NP - Appointments are:  Medication management: Monday to Thursday 8:00 AM to 4:00 PM Tamyka Bezio, MD - Appointments are:  Medication management: Monday and Wednesday 8:00 AM to 4:00 PM Procedure day: Tuesday and Thursday 7:30 AM to 4:00 PM Bilal Lateef, MD - Appointments are:  Medication management: Tuesday and Thursday 8:00 AM to 4:00 PM Procedure day: Monday and Wednesday 7:30 AM to 4:00 PM (Last update: 03/01/2017) ____________________________________________________________________________________________   ____________________________________________________________________________________________  CANNABIDIOL (AKA: CBD Oil or Pills)  Applies to: All patients receiving prescriptions of controlled substances (written and/or electronic).  General Information: Cannabidiol (CBD) was discovered in 1940. It is one of some 113 identified cannabinoids in cannabis (Marijuana) plants, accounting for up to 40% of the plant's extract. As of 2018, preliminary clinical research on cannabidiol included studies of anxiety, cognition, movement disorders, and pain.  Cannabidiol is consummed in multiple ways, including inhalation of cannabis smoke or vapor, as an aerosol spray into the cheek, and by mouth. It may be supplied as CBD oil containing CBD as the active ingredient (no added tetrahydrocannabinol (THC) or terpenes), a full-plant CBD-dominant hemp extract oil, capsules, dried cannabis, or as a liquid solution. CBD is thought not have the same psychoactivity as THC, and may affect the actions of THC. Studies suggest that CBD may interact with different biological targets, including cannabinoid receptors and other neurotransmitter receptors. As of 2018 the mechanism of action for its biological effects has not been determined.  In the [place], cannabidiol has a limited  approval by the Food and Drug Administration (FDA) for treatment of only two types   of epilepsy disorders. The side effects of long-term use of the drug include somnolence, decreased appetite, diarrhea, fatigue, malaise, weakness, sleeping problems, and others.  CBD remains a Schedule I drug prohibited for any use.  Legality: Some manufacturers ship CBD products nationally, an illegal action which the FDA has not enforced in 2018, with CBD remaining the subject of an FDA investigational new drug evaluation, and is not considered legal as a dietary supplement or food ingredient as of December 2018. Federal illegality has made it difficult historically to conduct research on CBD. CBD is openly sold in head shops and health food stores in some states where such sales have not been explicitly legalized.  Warning: Because it is not FDA approved for general use or treatment of pain, it is not required to undergo the same manufacturing controls as prescription drugs.  This means that the available cannabidiol (CBD) may be contaminated with THC.  If this is the case, it will trigger a positive urine drug screen (UDS) test for cannabinoids (Marijuana).  Because a positive UDS for illicit substances is a violation of our medication agreement, your opioid analgesics (pain medicine) may be permanently discontinued. (Last update: 03/22/2017) ____________________________________________________________________________________________    
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
## 2018-04-22 ENCOUNTER — Ambulatory Visit: Payer: 59 | Attending: Pain Medicine | Admitting: Pain Medicine

## 2018-04-22 ENCOUNTER — Other Ambulatory Visit: Payer: Self-pay

## 2018-04-22 DIAGNOSIS — M7918 Myalgia, other site: Secondary | ICD-10-CM

## 2018-04-22 DIAGNOSIS — M25571 Pain in right ankle and joints of right foot: Secondary | ICD-10-CM

## 2018-04-22 DIAGNOSIS — M79604 Pain in right leg: Secondary | ICD-10-CM

## 2018-04-22 DIAGNOSIS — M25551 Pain in right hip: Secondary | ICD-10-CM

## 2018-04-22 DIAGNOSIS — M47816 Spondylosis without myelopathy or radiculopathy, lumbar region: Secondary | ICD-10-CM | POA: Insufficient documentation

## 2018-04-22 DIAGNOSIS — M5441 Lumbago with sciatica, right side: Secondary | ICD-10-CM | POA: Diagnosis not present

## 2018-04-22 DIAGNOSIS — G894 Chronic pain syndrome: Secondary | ICD-10-CM

## 2018-04-22 DIAGNOSIS — G8929 Other chronic pain: Secondary | ICD-10-CM | POA: Insufficient documentation

## 2018-04-22 DIAGNOSIS — M47817 Spondylosis without myelopathy or radiculopathy, lumbosacral region: Secondary | ICD-10-CM | POA: Insufficient documentation

## 2018-04-22 DIAGNOSIS — M792 Neuralgia and neuritis, unspecified: Secondary | ICD-10-CM

## 2018-04-22 MED ORDER — OXYCODONE-ACETAMINOPHEN 7.5-325 MG PO TABS: 1.0000 | ORAL_TABLET | Freq: Three times a day (TID) | ORAL | 0 refills | Status: DC | PRN

## 2018-04-22 MED ORDER — GABAPENTIN 300 MG PO CAPS: 300.0000 mg | ORAL_CAPSULE | Freq: Three times a day (TID) | ORAL | 0 refills | Status: DC

## 2018-04-22 MED ORDER — ORPHENADRINE CITRATE ER 100 MG PO TB12: 100.0000 mg | ORAL_TABLET | Freq: Two times a day (BID) | ORAL | 0 refills | Status: DC | PRN

## 2018-04-22 NOTE — Progress Notes (Signed)
Pain Management Virtual Encounter Note - Virtual Visit via Telephone Telehealth (real-time audio visits between healthcare provider and patient).  Patient's Phone No. & Preferred Pharmacy:  [phone] (home); There is no such number on file (mobile).; (Preferred) [phone] No e-mail address on record  [facility] - [place], [place] - [address] [place] [address] Phone: [phone] Fax: [phone]   Pre-screening note:  Our staff contacted Connie Whitney and offered her an "in person", "face-to-face" appointment versus a telephone encounter. She indicated preferring the telephone encounter, at this time.  Reason for Virtual Visit: COVID-19*  Social distancing based on CDC and AMA recommendations.   I contacted Connie Whitney on 04/22/2018 at 9:37 AM via telephone and clearly identified myself as Oswaldo Done, MD. I verified that I was speaking with the correct person using two identifiers (Name and date of birth: 05-19-1980).  Advanced Informed Consent I sought verbal advanced consent from Connie Whitney for virtual visit interactions. I informed Connie Whitney of possible security and privacy concerns, risks, and limitations associated with providing "not-in-person" medical evaluation and management services. I also informed Connie Whitney of the availability of "in-person" appointments. Finally, I informed her that there would be a charge for the virtual visit and that she could be  personally, fully or partially, financially responsible for it. Connie Whitney expressed understanding and agreed to proceed.   Historic Elements   Connie Whitney is a 38 y.o. year old, female patient evaluated today after her last encounter by our practice on 04/01/2018. Connie Whitney  has a past medical history of Anemia, Arthritis, Fibromyalgia, Hypertension, Lupus (HCC), Migraines, Neuropathy, and Vitamin D deficiency. She also  has a past surgical history that  includes Dilation and curettage, diagnostic / therapeutic; Cesarean section; Tubal ligation; Hysteroscopy with novasure (N/A, 08/21/2014); Esophagogastroduodenoscopy (egd) with propofol (N/A, 08/02/2016); and Bariatric Surgery. Connie Whitney has a current medication list which includes the following prescription(s): amitriptyline hcl, biotin, carvedilol, ferrous sulfate, linaclotide, lisinopril, magnesium, metformin, potassium chloride, gabapentin, orphenadrine, and oxycodone-acetaminophen. She  reports that she has never smoked. She has never used smokeless tobacco. She reports that she does not drink alcohol or use drugs. Connie Whitney is allergic to lac bovis; milk-related compounds; pravastatin; and venlafaxine.   HPI  I last saw her on 04/01/2018. She is being evaluated for medication management.  Pharmacotherapy Assessment  Analgesic: Oxycodone/acetaminophen 7.5/325 mg, 1 tablet 3 times daily (last fill date 02/06/2018) oxycodone22.5 mg/day Highest recorded MME/day:33.75mg /day MME/day:33.75mg /day   Monitoring: Pharmacotherapy: No side-effects or adverse reactions reported. [place] PMP: PDMP reviewed during this encounter.       Compliance: No problems identified or detected. Plan: Refer to "POC".  Review of recent tests  DG Lumbar Spine Complete W/Bend CLINICAL DATA:  38 year old female with pain radiating from the low back to the hip. Patient reports nerve damage.  EXAM: LUMBAR SPINE - COMPLETE WITH BENDING VIEWS  COMPARISON:  Pelvis and right hip series today.  FINDINGS: Upright views. Normal lumbar segmentation. Preserved lumbar lordosis. Subtle retrolisthesis of L5 on S1. Lateral views in neutral flexion and extension positioning. No abnormal motion.  Relatively preserved disc spaces. No pars fracture. Intermittent minor endplate spurring. Sacral ala and SI joints appear normal. Visible lower thoracic levels appear intact. Negative visible abdominal visceral contours. Mild  degenerative acetabular spurring redemonstrated.  IMPRESSION: No acute osseous abnormality identified in the lumbar spine. Relatively preserved disc spaces and mild for age degenerative osseous changes.  Electronically Signed   By:  Odessa Fleming M.D.   On: 03/07/2018 09:13   DG Shoulder Right CLINICAL DATA:  38 year old female with pain radiating from right shoulder. Patient reports nerve damage.  EXAM: RIGHT SHOULDER - 2+ VIEW  COMPARISON:  Chest radiographs 09/12/2017.  FINDINGS: Bone mineralization is within normal limits. There is no evidence of fracture or dislocation. There is no evidence of arthropathy or other focal bone abnormality. Negative visible right chest.  IMPRESSION: Negative.  Electronically Signed   By: Odessa Fleming M.D.   On: 03/07/2018 08:58   DG HIP UNILAT W OR W/O PELVIS 2-3 VIEWS RIGHT CLINICAL DATA:  38 year old female with pain radiating from the low back to the hip. Patient reports nerve damage.  EXAM: DG HIP (WITH OR WITHOUT PELVIS) 2-3V RIGHT  COMPARISON:  None.  FINDINGS: Bone mineralization is within normal limits. Femoral heads are normally located. There is bilateral degenerative acetabular spurring but the hip joint spaces appear relatively preserved. Intact proximal right femur. Grossly intact visible proximal left femur. Pelvis appears intact. Sacral ala and SI joints appear normal. No acute osseous abnormality identified. Negative lower abdominal and pelvic visceral contours.  IMPRESSION: No acute osseous abnormality identified. Mild degenerative acetabular spurring at both hips.  Electronically Signed   By: Odessa Fleming M.D.   On: 03/07/2018 08:55   Office Visit on 03/06/2018  Component Date Value Ref Range Status  . Summary 03/06/2018 FINAL   Final   Comment: ==================================================================== TOXASSURE COMP DRUG  ANALYSIS,UR ==================================================================== Test                             Result       Flag       Units Drug Present   Gabapentin                     PRESENT   Cyclobenzaprine                PRESENT   Desmethylcyclobenzaprine       PRESENT    Desmethylcyclobenzaprine is an expected metabolite of    cyclobenzaprine.   Acetaminophen                  PRESENT ==================================================================== Test                      Result    Flag   Units      Ref Range   Creatinine              372              mg/dL      >=40 ==================================================================== Declared Medications:  Medication list was not provided. ==================================================================== For clinical consultation, please call [phone]. ====================================================                          ================   . Glucose 03/06/2018 92  65 - 99 mg/dL Final  . BUN 95/62/1308 13  6 - 20 mg/dL Final  . Creatinine, Ser 03/06/2018 1.05* 0.57 - 1.00 mg/dL Final  . GFR calc non Af Amer 03/06/2018 67  >59 mL/min/1.73 Final  . GFR calc Af Amer 03/06/2018 77  >59 mL/min/1.73 Final  . BUN/Creatinine Ratio 03/06/2018 12  9 - 23 Final  . Sodium 03/06/2018 141  134 - 144 mmol/L Final  . Potassium 03/06/2018 3.2* 3.5 - 5.2 mmol/L Final  .  Chloride 03/06/2018 99  96 - 106 mmol/L Final  . Calcium 03/06/2018 9.5  8.7 - 10.2 mg/dL Final  . Total Protein 03/06/2018 7.0  6.0 - 8.5 g/dL Final  . Albumin 16/10/9602 4.2  3.8 - 4.8 g/dL Final  . Globulin, Total 03/06/2018 2.8  1.5 - 4.5 g/dL Final  . Albumin/Globulin Ratio 03/06/2018 1.5  1.2 - 2.2 Final  . Bilirubin Total 03/06/2018 0.2  0.0 - 1.2 mg/dL Final  . Alkaline Phosphatase 03/06/2018 139* 39 - 117 IU/L Final  . AST 03/06/2018 14  0 - 40 IU/L Final  . Magnesium 03/06/2018 1.5* 1.6 - 2.3 mg/dL Final  . Vitamin V-40 98/11/9145 >2000*  232 - 1245 pg/mL Final  . Sed Rate 03/06/2018 50* 0 - 32 mm/hr Final  . 25-Hydroxy, Vitamin D 03/06/2018 30  ng/mL Final   Comment: Reference Range: All Ages: Target levels 30 - 100   . 25-Hydroxy, Vitamin D-2 03/06/2018 14  ng/mL Final  . 25-Hydroxy, Vitamin D-3 03/06/2018 16  ng/mL Final  . CRP 03/06/2018 5  0 - 10 mg/L Final   Assessment  The primary encounter diagnosis was Chronic pain syndrome. Diagnoses of Chronic low back pain (Primary area of Pain) (Bilateral) (R>L) w/ sciatica (Right), Chronic lower extremity pain (Secondary Area of Pain) (Right), Chronic hip pain (Tertiary Area of Pain) (Right), Chronic ankle pain (Fourth Area of Pain) (Right), Neurogenic pain, Lumbar facet syndrome (Bilateral), Spondylosis without myelopathy or radiculopathy, lumbosacral region, and Chronic musculoskeletal pain were also pertinent to this visit.  Plan of Care  I have discontinued Connie Whitney's linagliptin, topiramate, and cyclobenzaprine. I have also changed her orphenadrine. Additionally, I am having her maintain her IRON PO, carvedilol, metFORMIN, AMITRIPTYLINE HCL PO, linaclotide, Biotin, potassium chloride, Magnesium, gabapentin, oxyCODONE-acetaminophen, and lisinopril.  Pharmacotherapy (Medications Ordered): Meds ordered this encounter  Medications  . gabapentin (NEURONTIN) 300 MG capsule    Sig: Take 1 capsule (300 mg total) by mouth 3 (three) times daily for 30 days.    Dispense:  90 capsule    Refill:  0    Do not place medication on "Automatic Refill". Fill one day early if pharmacy is closed on scheduled refill date.  Marland Kitchen oxyCODONE-acetaminophen (PERCOCET) 7.5-325 MG tablet    Sig: Take 1 tablet by mouth every 8 (eight) hours as needed for up to 30 days for moderate pain or severe pain. Must last 30 days.    Dispense:  90 tablet    Refill:  0    [place] STOP ACT - Not applicable to Chronic Pain Syndrome (G89.4) diagnosis. Fill one day early if pharmacy is closed on scheduled refill date.  Do not fill until: 05/01/18. To last until: 05/31/18.  Marland Kitchen orphenadrine (NORFLEX) 100 MG tablet    Sig: Take 1 tablet (100 mg total) by mouth 2 (two) times daily as needed for up to 30 days for muscle spasms or mild pain.    Dispense:  20 tablet    Refill:  0    Do not place medication on "Automatic Refill". Fill one day early if pharmacy is closed on scheduled refill date.   Orders:  Orders Placed This Encounter  Procedures  . LUMBAR FACET(MEDIAL BRANCH NERVE BLOCK) MBNB    Standing Status:   Future    Standing Expiration Date:   05/22/2018    Scheduling Instructions:     Side: Bilateral     Level: L3-4, L4-5, & L5-S1 Facets (L2, L3, L4, L5, & S1 Medial Branch Nerves)  Sedation: Patient's choice.     Timeframe: ASAA    Order Specific Question:   Where will this procedure be performed?    Answer:   [facility]   Follow-up plan:   Return in about 1 month (around 05/22/2018) for Med-Mgmt, w/ Dr. Laban EmperorNaveira, Procedure (w/ sedation): (B) L-FCT BLK #1.   Interventional management options: Planned, scheduled, and/or pending:    Diagnostic bilateral lumbar facet block #1 under fluoroscopic guidance and IV sedation.   Plan: Diagnostic bilateral lumbar facet block  Vs. the bilateral hip injection.   Considering:   Diagnosticright intra-articular hip injection  Diagnostic midline LESI  Diagnostic bilateral lumbar facet nerve block  Possible bilateral lumbar facet RFA    PRN Procedures:   None at this time   I discussed the assessment and treatment plan with the patient. The patient was provided an opportunity to ask questions and all were answered. The patient agreed with the plan and demonstrated an understanding of the instructions.  Patient advised to call back or seek an in-person evaluation if the symptoms or condition worsens.  Total duration of non-face-to-face encounter: 15 minutes.  Note by: Oswaldo DoneFrancisco A Jamilet Ambroise, MD Date: 04/22/2018; Time: 9:37 AM  Disclaimer:  *  Given the special circumstances of the COVID-19 pandemic, the federal government has announced that the Office for Civil Rights (OCR) will exercise its enforcement discretion and will not impose penalties on physicians using telehealth in the event of noncompliance with regulatory requirements under the [employer] Insurance Portability and Accountability Act (HIPAA) in connection with the good faith provision of telehealth during the COVID-19 national public health emergency. (AMA)

## 2018-04-22 NOTE — Addendum Note (Signed)
Addended by: Delano Metz A on: 04/22/2018 11:34 AM   Modules accepted: Orders

## 2018-04-22 NOTE — Patient Instructions (Signed)

## 2018-04-23 ENCOUNTER — Telehealth: Payer: Self-pay

## 2018-04-23 NOTE — Telephone Encounter (Signed)
The pharmacist is concerned because the patient had previously been taking 600 mg of gabapentin in the morning and afternoon and then 900 mg at night. Dr. Laban Emperor did a phone visit yesterday and prescribed 1- 300 mg tid. Please call her back to clarify

## 2018-04-24 ENCOUNTER — Encounter: Payer: Self-pay | Admitting: Pain Medicine

## 2018-04-30 ENCOUNTER — Other Ambulatory Visit: Payer: Self-pay

## 2018-04-30 ENCOUNTER — Ambulatory Visit: Payer: 59 | Attending: Pain Medicine | Admitting: Pain Medicine

## 2018-04-30 ENCOUNTER — Encounter: Payer: Self-pay | Admitting: Pain Medicine

## 2018-04-30 DIAGNOSIS — M5441 Lumbago with sciatica, right side: Secondary | ICD-10-CM | POA: Diagnosis not present

## 2018-04-30 DIAGNOSIS — M5136 Other intervertebral disc degeneration, lumbar region: Secondary | ICD-10-CM | POA: Diagnosis not present

## 2018-04-30 DIAGNOSIS — M47816 Spondylosis without myelopathy or radiculopathy, lumbar region: Secondary | ICD-10-CM

## 2018-04-30 DIAGNOSIS — M792 Neuralgia and neuritis, unspecified: Secondary | ICD-10-CM

## 2018-04-30 DIAGNOSIS — M7918 Myalgia, other site: Secondary | ICD-10-CM

## 2018-04-30 DIAGNOSIS — G8929 Other chronic pain: Secondary | ICD-10-CM

## 2018-04-30 DIAGNOSIS — G894 Chronic pain syndrome: Secondary | ICD-10-CM

## 2018-04-30 MED ORDER — ORPHENADRINE CITRATE ER 100 MG PO TB12: 100.0000 mg | ORAL_TABLET | Freq: Two times a day (BID) | ORAL | 2 refills | Status: DC | PRN

## 2018-04-30 MED ORDER — OXYCODONE-ACETAMINOPHEN 7.5-325 MG PO TABS: 1.0000 | ORAL_TABLET | Freq: Three times a day (TID) | ORAL | 0 refills | Status: DC | PRN

## 2018-04-30 MED ORDER — GABAPENTIN 300 MG PO CAPS: ORAL_CAPSULE | ORAL | 2 refills | Status: DC

## 2018-04-30 NOTE — Progress Notes (Signed)
Pain Management Virtual Encounter Note - Virtual Visit via Telephone Telehealth (real-time audio visits between healthcare provider and patient).  Patient's Phone No. & Preferred Pharmacy:  [phone] (home); There is no such number on file (mobile).; (Preferred) [phone] No e-mail address on record  [facility] - [place], [place] - [address] [place] [address] Phone: [phone] Fax: [phone]   Pre-screening note:  Our staff contacted Ms. Connie Whitney and offered her an "in person", "face-to-face" appointment versus a telephone encounter. She indicated preferring the telephone encounter, at this time.  Reason for Virtual Visit: COVID-19*  Social distancing based on CDC and AMA recommendations.   I contacted Connie Whitney on 04/30/2018 at 11:00 AM via video conference and clearly identified myself as Oswaldo Done, MD. I verified that I was speaking with the correct person using two identifiers (Name and date of birth: [birth year]).  Advanced Informed Consent I sought verbal advanced consent from Connie Whitney for virtual visit interactions. I informed Connie Whitney of possible security and privacy concerns, risks, and limitations associated with providing "not-in-person" medical evaluation and management services. I also informed Connie Whitney of the availability of "in-person" appointments. Finally, I informed her that there would be a charge for the virtual visit and that she could be  personally, fully or partially, financially responsible for it. Connie Whitney expressed understanding and agreed to proceed.   Historic Elements   Connie Whitney is a 38 y.o. year old, female patient evaluated today after her last encounter by our practice on 04/23/2018. Connie Whitney  has a past medical history of Anemia, Arthritis, Fibromyalgia, Hypertension, Lupus (HCC), Migraines, Neuropathy, and Vitamin D deficiency. She also  has a past surgical history  that includes Dilation and curettage, diagnostic / therapeutic; Cesarean section; Tubal ligation; Hysteroscopy with novasure (N/A, 08/21/2014); Esophagogastroduodenoscopy (egd) with propofol (N/A, 08/02/2016); and Bariatric Surgery. Connie Whitney has a current medication list which includes the following prescription(s): amitriptyline hcl, biotin, carvedilol, ferrous sulfate, linaclotide, lisinopril, magnesium, metformin, potassium chloride, gabapentin, orphenadrine, and oxycodone-acetaminophen. She  reports that she has never smoked. She has never used smokeless tobacco. She reports that she does not drink alcohol or use drugs. Connie Whitney is allergic to lac bovis; milk-related compounds; pravastatin; and venlafaxine.   HPI  I last communicated with her on 04/22/2018. Today, she is being contacted for medication management.   Pharmacotherapy Assessment  Analgesic: Oxycodone/acetaminophen 7.5/325 mg, 1 tablet 3 times daily (last fill date 04/11/2018) oxycodone22.5 mg/day Highest recorded MME/day:33.75mg /day MME/day:33.75mg /day   Monitoring: Pharmacotherapy: No side-effects or adverse reactions reported. [place] PMP: PDMP reviewed during this encounter.          Compliance: No problems identified or detected. Plan: Refer to "POC".  Review of recent tests  DG Lumbar Spine Complete W/Bend CLINICAL DATA:  38 year old female with pain radiating from the low back to the hip. Patient reports nerve damage.  EXAM: LUMBAR SPINE - COMPLETE WITH BENDING VIEWS  COMPARISON:  Pelvis and right hip series today.  FINDINGS: Upright views. Normal lumbar segmentation. Preserved lumbar lordosis. Subtle retrolisthesis of L5 on S1. Lateral views in neutral flexion and extension positioning. No abnormal motion.  Relatively preserved disc spaces. No pars fracture. Intermittent minor endplate spurring. Sacral ala and SI joints appear normal. Visible lower thoracic levels appear intact. Negative visible abdominal  visceral contours. Mild degenerative acetabular spurring redemonstrated.  IMPRESSION: No acute osseous abnormality identified in the lumbar spine. Relatively preserved disc spaces and mild for age degenerative osseous  changes.  Electronically Signed   By: Odessa Fleming M.D.   On: 03/07/2018 09:13 DG Shoulder Right CLINICAL DATA:  38 year old female with pain radiating from right shoulder. Patient reports nerve damage.  EXAM: RIGHT SHOULDER - 2+ VIEW  COMPARISON:  Chest radiographs 09/12/2017.  FINDINGS: Bone mineralization is within normal limits. There is no evidence of fracture or dislocation. There is no evidence of arthropathy or other focal bone abnormality. Negative visible right chest.  IMPRESSION: Negative.  Electronically Signed   By: Odessa Fleming M.D.   On: 03/07/2018 08:58 DG HIP UNILAT W OR W/O PELVIS 2-3 VIEWS RIGHT CLINICAL DATA:  38 year old female with pain radiating from the low back to the hip. Patient reports nerve damage.  EXAM: DG HIP (WITH OR WITHOUT PELVIS) 2-3V RIGHT  COMPARISON:  None.  FINDINGS: Bone mineralization is within normal limits. Femoral heads are normally located. There is bilateral degenerative acetabular spurring but the hip joint spaces appear relatively preserved. Intact proximal right femur. Grossly intact visible proximal left femur. Pelvis appears intact. Sacral ala and SI joints appear normal. No acute osseous abnormality identified. Negative lower abdominal and pelvic visceral contours.  IMPRESSION: No acute osseous abnormality identified. Mild degenerative acetabular spurring at both hips.  Electronically Signed   By: Odessa Fleming M.D.   On: 03/07/2018 08:55   Office Visit on 03/06/2018  Component Date Value Ref Range Status  . Summary 03/06/2018 FINAL   Final   Comment: ==================================================================== TOXASSURE COMP DRUG  ANALYSIS,UR ==================================================================== Test                             Result       Flag       Units Drug Present   Gabapentin                     PRESENT   Cyclobenzaprine                PRESENT   Desmethylcyclobenzaprine       PRESENT    Desmethylcyclobenzaprine is an expected metabolite of    cyclobenzaprine.   Acetaminophen                  PRESENT ==================================================================== Test                      Result    Flag   Units      Ref Range   Creatinine              372              mg/dL      >=16 ==================================================================== Declared Medications:  Medication list was not provided. ==================================================================== For clinical consultation, please call [phone]. ====================================================                          ================   . Glucose 03/06/2018 92  65 - 99 mg/dL Final  . BUN 81/19/1478 13  6 - 20 mg/dL Final  . Creatinine, Ser 03/06/2018 1.05* 0.57 - 1.00 mg/dL Final  . GFR calc non Af Amer 03/06/2018 67  >59 mL/min/1.73 Final  . GFR calc Af Amer 03/06/2018 77  >59 mL/min/1.73 Final  . BUN/Creatinine Ratio 03/06/2018 12  9 - 23 Final  . Sodium 03/06/2018 141  134 - 144 mmol/L Final  . Potassium 03/06/2018 3.2* 3.5 - 5.2 mmol/L  Final  . Chloride 03/06/2018 99  96 - 106 mmol/L Final  . Calcium 03/06/2018 9.5  8.7 - 10.2 mg/dL Final  . Total Protein 03/06/2018 7.0  6.0 - 8.5 g/dL Final  . Albumin 96/04/540903/04/2018 4.2  3.8 - 4.8 g/dL Final  . Globulin, Total 03/06/2018 2.8  1.5 - 4.5 g/dL Final  . Albumin/Globulin Ratio 03/06/2018 1.5  1.2 - 2.2 Final  . Bilirubin Total 03/06/2018 0.2  0.0 - 1.2 mg/dL Final  . Alkaline Phosphatase 03/06/2018 139* 39 - 117 IU/L Final  . AST 03/06/2018 14  0 - 40 IU/L Final  . Magnesium 03/06/2018 1.5* 1.6 - 2.3 mg/dL Final  . Vitamin W-11B-12 91/47/829503/04/2018 >2000*  232 - 1245 pg/mL Final  . Sed Rate 03/06/2018 50* 0 - 32 mm/hr Final  . 25-Hydroxy, Vitamin D 03/06/2018 30  ng/mL Final   Comment: Reference Range: All Ages: Target levels 30 - 100   . 25-Hydroxy, Vitamin D-2 03/06/2018 14  ng/mL Final  . 25-Hydroxy, Vitamin D-3 03/06/2018 16  ng/mL Final  . CRP 03/06/2018 5  0 - 10 mg/L Final   Assessment  The primary encounter diagnosis was Chronic pain syndrome. Diagnoses of Chronic low back pain (Primary area of Pain) (Bilateral) (R>L) w/ sciatica (Right), Lumbar facet syndrome (Bilateral), DDD (degenerative disc disease), lumbar, Chronic musculoskeletal pain, and Neurogenic pain were also pertinent to this visit.  Plan of Care  I have changed Connie Whitney's orphenadrine and gabapentin. I am also having her maintain her IRON PO, carvedilol, metFORMIN, AMITRIPTYLINE HCL PO, linaclotide, Biotin, potassium chloride, Magnesium, lisinopril, and oxyCODONE-acetaminophen.  Pharmacotherapy (Medications Ordered): Meds ordered this encounter  Medications  . orphenadrine (NORFLEX) 100 MG tablet    Sig: Take 1 tablet (100 mg total) by mouth 2 (two) times daily as needed for muscle spasms or mild pain.    Dispense:  60 tablet    Refill:  2    Do not place medication on "Automatic Refill". Fill one day early if pharmacy is closed on scheduled refill date.  Marland Kitchen. oxyCODONE-acetaminophen (PERCOCET) 7.5-325 MG tablet    Sig: Take 1 tablet by mouth every 8 (eight) hours as needed for up to 30 days for moderate pain or severe pain. Must last 30 days.    Dispense:  90 tablet    Refill:  0    [place] STOP ACT - Not applicable to Chronic Pain Syndrome (G89.4) diagnosis. Fill one day early if pharmacy is closed on scheduled refill date. Do not fill until: 05/11/18. To last until: 06/10/18.  Marland Kitchen. gabapentin (NEURONTIN) 300 MG capsule    Sig: Take 2 capsules (600 mg total) by mouth every morning AND 2 capsules (600 mg total) daily at 12 noon AND 3 capsules (900 mg total) at bedtime.     Dispense:  210 capsule    Refill:  2    Do not place medication on "Automatic Refill". Fill one day early if pharmacy is closed on scheduled refill date.   Orders:  Orders Placed This Encounter  Procedures  . LUMBAR FACET(MEDIAL BRANCH NERVE BLOCK) MBNB    Standing Status:   Future    Standing Expiration Date:   05/30/2018    Scheduling Instructions:     Side: Bilateral     Level: L3-4, L4-5, & L5-S1 Facets (L2, L3, L4, L5, & S1 Medial Branch Nerves)     Sedation: Patient's choice.     Timeframe: ASAA    Order Specific Question:   Where will this procedure  be performed?    Answer:   [facility]   Follow-up plan:   Return in about 5 weeks (around 06/03/2018) for (Virtual Visit), Med-Mgmt, in addition, Procedure, (w/ sedation): (B) L-FCT BLK #1.   Interventional management options: Planned, scheduled, and/or pending: Diagnostic bilateral lumbar facet block#1under fluoroscopic guidance and IV sedation.  Plan: Diagnostic bilateral lumbar facet block Vs.the bilateral hip injection.   Considering: Diagnosticright intra-articular hip injection Diagnostic midlineLESI Diagnostic bilateral lumbar facet nerve block Possible bilateral lumbar facetRFA   PRN Procedures: None at this time   I discussed the assessment and treatment plan with the patient. The patient was provided an opportunity to ask questions and all were answered. The patient agreed with the plan and demonstrated an understanding of the instructions.  Patient advised to call back or seek an in-person evaluation if the symptoms or condition worsens.  Total duration of non-face-to-face encounter: 12 minutes.  Note by: Oswaldo Done, MD Date: 04/30/2018; Time: 11:17 AM  Disclaimer:  * Given the special circumstances of the COVID-19 pandemic, the federal government has announced that the Office for Civil Rights (OCR) will exercise its enforcement discretion and will not impose penalties  on physicians using telehealth in the event of noncompliance with regulatory requirements under the [employer] Portability and Accountability Act (HIPAA) in connection with the good faith provision of telehealth during the COVID-19 national public health emergency. (AMA)

## 2018-04-30 NOTE — Patient Instructions (Addendum)
____________________________________________________________________________________________  Preparing for Procedure with Sedation  Procedure appointments are limited to planned procedures: . No Prescription Refills. . No disability issues will be discussed. . No medication changes will be discussed.  Instructions: . Oral Intake: Do not eat or drink anything for at least 8 hours prior to your procedure. . Transportation: Public transportation is not allowed. Bring an adult driver. The driver must be physically present in our waiting room before any procedure can be started. . Physical Assistance: Bring an adult physically capable of assisting you, in the event you need help. This adult should keep you company at home for at least 6 hours after the procedure. . Blood Pressure Medicine: Take your blood pressure medicine with a sip of water the morning of the procedure. . Blood thinners: Notify our staff if you are taking any blood thinners. Depending on which one you take, there will be specific instructions on how and when to stop it. . Diabetics on insulin: Notify the staff so that you can be scheduled 1st case in the morning. If your diabetes requires high dose insulin, take only  of your normal insulin dose the morning of the procedure and notify the staff that you have done so. . Preventing infections: Shower with an antibacterial soap the morning of your procedure. . Build-up your immune system: Take 1000 mg of Vitamin C with every meal (3 times a day) the day prior to your procedure. . Antibiotics: Inform the staff if you have a condition or reason that requires you to take antibiotics before dental procedures. . Pregnancy: If you are pregnant, call and cancel the procedure. . Sickness: If you have a cold, fever, or any active infections, call and cancel the procedure. . Arrival: You must be in the facility at least 30 minutes prior to your scheduled procedure. . Children: Do not bring  children with you. . Dress appropriately: Bring dark clothing that you would not mind if they get stained. . Valuables: Do not bring any jewelry or valuables.  Reasons to call and reschedule or cancel your procedure: (Following these recommendations will minimize the risk of a serious complication.) . Surgeries: Avoid having procedures within 2 weeks of any surgery. (Avoid for 2 weeks before or after any surgery). . Flu Shots: Avoid having procedures within 2 weeks of a flu shots or . (Avoid for 2 weeks before or after immunizations). . Barium: Avoid having a procedure within 7-10 days after having had a radiological study involving the use of radiological contrast. (Myelograms, Barium swallow or enema study). . Heart attacks: Avoid any elective procedures or surgeries for the initial 6 months after a "Myocardial Infarction" (Heart Attack). . Blood thinners: It is imperative that you stop these medications before procedures. Let us know if you if you take any blood thinner.  . Infection: Avoid procedures during or within two weeks of an infection (including chest colds or gastrointestinal problems). Symptoms associated with infections include: Localized redness, fever, chills, night sweats or profuse sweating, burning sensation when voiding, cough, congestion, stuffiness, runny nose, sore throat, diarrhea, nausea, vomiting, cold or Flu symptoms, recent or current infections. It is specially important if the infection is over the area that we intend to treat. . Heart and lung problems: Symptoms that may suggest an active cardiopulmonary problem include: cough, chest pain, breathing difficulties or shortness of breath, dizziness, ankle swelling, uncontrolled high or unusually low blood pressure, and/or palpitations. If you are experiencing any of these symptoms, cancel your procedure and contact   your primary care physician for an evaluation.  Remember:  Regular Business hours are:  Monday to Thursday  8:00 AM to 4:00 PM  Provider's Schedule: Milinda Pointer, MD:  Procedure days: Tuesday and Thursday 7:30 AM to 4:00 PM  Gillis Santa, MD:  Procedure days: Monday and Wednesday 7:30 AM to 4:00 PM ____________________________________________________________________________________________   ____________________________________________________________________________________________  Medication Rules  Purpose: To inform patients, and their family members, of our rules and regulations.  Applies to: All patients receiving prescriptions (written or electronic).  Pharmacy of record: Pharmacy where electronic prescriptions will be sent. If written prescriptions are taken to a different pharmacy, please inform the nursing staff. The pharmacy listed in the electronic medical record should be the one where you would like electronic prescriptions to be sent.  Electronic prescriptions: In compliance with the [place] (STOP) Act of 2017 (Session Lanny Cramp [phone]), effective January 02, 2018, all controlled substances must be electronically prescribed. Calling prescriptions to the pharmacy will cease to exist.  Prescription refills: Only during scheduled appointments. Applies to all prescriptions.  NOTE: The following applies primarily to controlled substances (Opioid* Pain Medications).   Patient's responsibilities: 1. Pain Pills: Bring all pain pills to every appointment (except for procedure appointments). 2. Pill Bottles: Bring pills in original pharmacy bottle. Always bring the newest bottle. Bring bottle, even if empty. 3. Medication refills: You are responsible for knowing and keeping track of what medications you take and those you need refilled. The day before your appointment: write a list of all prescriptions that need to be refilled. The day of the appointment: give the list to the admitting nurse. Prescriptions will be written only during  appointments. No prescriptions will be written on procedure days. If you forget a medication: it will not be "Called in", "Faxed", or "electronically sent". You will need to get another appointment to get these prescribed. No early refills. Do not call asking to have your prescription filled early. 4. Prescription Accuracy: You are responsible for carefully inspecting your prescriptions before leaving our office. Have the discharge nurse carefully go over each prescription with you, before taking them home. Make sure that your name is accurately spelled, that your address is correct. Check the name and dose of your medication to make sure it is accurate. Check the number of pills, and the written instructions to make sure they are clear and accurate. Make sure that you are given enough medication to last until your next medication refill appointment. 5. Taking Medication: Take medication as prescribed. When it comes to controlled substances, taking less pills or less frequently than prescribed is permitted and encouraged. Never take more pills than instructed. Never take medication more frequently than prescribed.  6. Inform other Doctors: Always inform, all of your healthcare providers, of all the medications you take. 7. Pain Medication from other Providers: You are not allowed to accept any additional pain medication from any other Doctor or Healthcare provider. There are two exceptions to this rule. (see below) In the event that you require additional pain medication, you are responsible for notifying us, as stated below. 8. Medication Agreement: You are responsible for carefully reading and following our Medication Agreement. This must be signed before receiving any prescriptions from our practice. Safely store a copy of your signed Agreement. Violations to the Agreement will result in no further prescriptions. (Additional copies of our Medication Agreement are available upon request.) 9. Laws, Rules,  & Regulations: All patients are expected to follow all Federal  and State Laws, Statutes, Rules, & Regulations. Ignorance of the Laws does not constitute a valid excuse. The use of any illegal substances is prohibited. 10. Adopted CDC guidelines & recommendations: Target dosing levels will be at or below 60 MME/day. Use of benzodiazepines** is not recommended.  Exceptions: There are only two exceptions to the rule of not receiving pain medications from other Healthcare Providers. 1. Exception #1 (Emergencies): In the event of an emergency (i.e.: accident requiring emergency care), you are allowed to receive additional pain medication. However, you are responsible for: As soon as you are able, call our office [phone], at any time of the day or night, and leave a message stating your name, the date and nature of the emergency, and the name and dose of the medication prescribed. In the event that your call is answered by a member of our staff, make sure to document and save the date, time, and the name of the person that took your information.  2. Exception #2 (Planned Surgery): In the event that you are scheduled by another doctor or dentist to have any type of surgery or procedure, you are allowed (for a period no longer than 30 days), to receive additional pain medication, for the acute post-op pain. However, in this case, you are responsible for picking up a copy of our "Post-op Pain Management for Surgeons" handout, and giving it to your surgeon or dentist. This document is available at our office, and does not require an appointment to obtain it. Simply go to our office during business hours (Monday-Thursday from 8:00 AM to 4:00 PM) (Friday 8:00 AM to 12:00 Noon) or if you have a scheduled appointment with us, prior to your surgery, and ask for it by name. In addition, you will need to provide us with your name, name of your surgeon, type of surgery, and date of procedure or surgery.  *Opioid  medications include: morphine, codeine, oxycodone, oxymorphone, hydrocodone, hydromorphone, meperidine, tramadol, tapentadol, buprenorphine, fentanyl, methadone. **Benzodiazepine medications include: diazepam (Valium), alprazolam (Xanax), clonazepam (Klonopine), lorazepam (Ativan), clorazepate (Tranxene), chlordiazepoxide (Librium), estazolam (Prosom), oxazepam (Serax), temazepam (Restoril), triazolam (Halcion) (Last updated: 03/01/2017) ____________________________________________________________________________________________   ____________________________________________________________________________________________  Medication Recommendations and Reminders  Applies to: All patients receiving prescriptions (written and/or electronic).  Medication Rules & Regulations: These rules and regulations exist for your safety and that of others. They are not flexible and neither are we. Dismissing or ignoring them will be considered "non-compliance" with medication therapy, resulting in complete and irreversible termination of such therapy. (See document titled "Medication Rules" for more details.) In all conscience, because of safety reasons, we cannot continue providing a therapy where the patient does not follow instructions.  Pharmacy of record:   Definition: This is the pharmacy where your electronic prescriptions will be sent.   We do not endorse any particular pharmacy.  You are not restricted in your choice of pharmacy.  The pharmacy listed in the electronic medical record should be the one where you want electronic prescriptions to be sent.  If you choose to change pharmacy, simply notify our nursing staff of your choice of new pharmacy.  Recommendations:  Keep all of your pain medications in a safe place, under lock and key, even if you live alone.   After you fill your prescription, take 1 week's worth of pills and put them away in a safe place. You should keep a separate,  properly labeled bottle for this purpose. The remainder should be kept in the original bottle. Use   this as your primary supply, until it runs out. Once it's gone, then you know that you have 1 week's worth of medicine, and it is time to come in for a prescription refill. If you do this correctly, it is unlikely that you will ever run out of medicine.  To make sure that the above recommendation works, it is very important that you make sure your medication refill appointments are scheduled at least 1 week before you run out of medicine. To do this in an effective manner, make sure that you do not leave the office without scheduling your next medication management appointment. Always ask the nursing staff to show you in your prescription , when your medication will be running out. Then arrange for the receptionist to get you a return appointment, at least 7 days before you run out of medicine. Do not wait until you have 1 or 2 pills left, to come in. This is very poor planning and does not take into consideration that we may need to cancel appointments due to bad weather, sickness, or emergencies affecting our staff.  "Partial Fill": If for any reason your pharmacy does not have enough pills/tablets to completely fill or refill your prescription, do not allow for a "partial fill". You will need a separate prescription to fill the remaining amount, which we will not provide. If the reason for the partial fill is your insurance, you will need to talk to the pharmacist about payment alternatives for the remaining tablets, but again, do not accept a partial fill.  Prescription refills and/or changes in medication(s):   Prescription refills, and/or changes in dose or medication, will be conducted only during scheduled medication management appointments. (Applies to both, written and electronic prescriptions.)  No refills on procedure days. No medication will be changed or started on procedure days. No changes,  adjustments, and/or refills will be conducted on a procedure day. Doing so will interfere with the diagnostic portion of the procedure.  No phone refills. No medications will be "called into the pharmacy".  No Fax refills.  No weekend refills.  No Holliday refills.  No after hours refills.  Remember:  Business hours are:  Monday to Thursday 8:00 AM to 4:00 PM Provider's Schedule: Crystal King, NP - Appointments are:  Medication management: Monday to Thursday 8:00 AM to 4:00 PM Debhora Titus, MD - Appointments are:  Medication management: Monday and Wednesday 8:00 AM to 4:00 PM Procedure day: Tuesday and Thursday 7:30 AM to 4:00 PM Bilal Lateef, MD - Appointments are:  Medication management: Tuesday and Thursday 8:00 AM to 4:00 PM Procedure day: Monday and Wednesday 7:30 AM to 4:00 PM (Last update: 03/01/2017) ____________________________________________________________________________________________   ____________________________________________________________________________________________  CANNABIDIOL (AKA: CBD Oil or Pills)  Applies to: All patients receiving prescriptions of controlled substances (written and/or electronic).  General Information: Cannabidiol (CBD) was discovered in 1940. It is one of some 113 identified cannabinoids in cannabis (Marijuana) plants, accounting for up to 40% of the plant's extract. As of 2018, preliminary clinical research on cannabidiol included studies of anxiety, cognition, movement disorders, and pain.  Cannabidiol is consummed in multiple ways, including inhalation of cannabis smoke or vapor, as an aerosol spray into the cheek, and by mouth. It may be supplied as CBD oil containing CBD as the active ingredient (no added tetrahydrocannabinol (THC) or terpenes), a full-plant CBD-dominant hemp extract oil, capsules, dried cannabis, or as a liquid solution. CBD is thought not have the same psychoactivity as THC, and may affect the   actions  of THC. Studies suggest that CBD may interact with different biological targets, including cannabinoid receptors and other neurotransmitter receptors. As of 2018 the mechanism of action for its biological effects has not been determined.  In the [place], cannabidiol has a limited approval by the Food and Drug Administration (FDA) for treatment of only two types of epilepsy disorders. The side effects of long-term use of the drug include somnolence, decreased appetite, diarrhea, fatigue, malaise, weakness, sleeping problems, and others.  CBD remains a Schedule I drug prohibited for any use.  Legality: Some manufacturers ship CBD products nationally, an illegal action which the FDA has not enforced in 2018, with CBD remaining the subject of an FDA investigational new drug evaluation, and is not considered legal as a dietary supplement or food ingredient as of December 2018. Federal illegality has made it difficult historically to conduct research on CBD. CBD is openly sold in head shops and health food stores in some states where such sales have not been explicitly legalized.  Warning: Because it is not FDA approved for general use or treatment of pain, it is not required to undergo the same manufacturing controls as prescription drugs.  This means that the available cannabidiol (CBD) may be contaminated with THC.  If this is the case, it will trigger a positive urine drug screen (UDS) test for cannabinoids (Marijuana).  Because a positive UDS for illicit substances is a violation of our medication agreement, your opioid analgesics (pain medicine) may be permanently discontinued. (Last update: 03/22/2017) ____________________________________________________________________________________________   ____________________________________________________________________________________________  Preparing for Procedure with Sedation  Procedure appointments are limited to planned procedures: . No  Prescription Refills. . No disability issues will be discussed. . No medication changes will be discussed.  Instructions: . Oral Intake: Do not eat or drink anything for at least 8 hours prior to your procedure. . Transportation: Public transportation is not allowed. Bring an adult driver. The driver must be physically present in our waiting room before any procedure can be started. Marland Kitchen Physical Assistance: Bring an adult physically capable of assisting you, in the event you need help. This adult should keep you company at home for at least 6 hours after the procedure. . Blood Pressure Medicine: Take your blood pressure medicine with a sip of water the morning of the procedure. . Blood thinners: Notify our staff if you are taking any blood thinners. Depending on which one you take, there will be specific instructions on how and when to stop it. . Diabetics on insulin: Notify the staff so that you can be scheduled 1st case in the morning. If your diabetes requires high dose insulin, take only  of your normal insulin dose the morning of the procedure and notify the staff that you have done so. . Preventing infections: Shower with an antibacterial soap the morning of your procedure. . Build-up your immune system: Take 1000 mg of Vitamin C with every meal (3 times a day) the day prior to your procedure. Marland Kitchen Antibiotics: Inform the staff if you have a condition or reason that requires you to take antibiotics before dental procedures. . Pregnancy: If you are pregnant, call and cancel the procedure. . Sickness: If you have a cold, fever, or any active infections, call and cancel the procedure. . Arrival: You must be in the facility at least 30 minutes prior to your scheduled procedure. . Children: Do not bring children with you. . Dress appropriately: Bring dark clothing that you would not mind if they  get stained. . Valuables: Do not bring any jewelry or valuables.  Reasons to call and reschedule or  cancel your procedure: (Following these recommendations will minimize the risk of a serious complication.) . Surgeries: Avoid having procedures within 2 weeks of any surgery. (Avoid for 2 weeks before or after any surgery). . Flu Shots: Avoid having procedures within 2 weeks of a flu shots or . (Avoid for 2 weeks before or after immunizations). . Barium: Avoid having a procedure within 7-10 days after having had a radiological study involving the use of radiological contrast. (Myelograms, Barium swallow or enema study). . Heart attacks: Avoid any elective procedures or surgeries for the initial 6 months after a "Myocardial Infarction" (Heart Attack). . Blood thinners: It is imperative that you stop these medications before procedures. Let us know if you if you take any blood thinner.  . Infection: Avoid procedures during or within two weeks of an infection (including chest colds or gastrointestinal problems). Symptoms associated with infections include: Localized redness, fever, chills, night sweats or profuse sweating, burning sensation when voiding, cough, congestion, stuffiness, runny nose, sore throat, diarrhea, nausea, vomiting, cold or Flu symptoms, recent or current infections. It is specially important if the infection is over the area that we intend to treat. Marland Kitchen. Heart and lung problems: Symptoms that may suggest an active cardiopulmonary problem include: cough, chest pain, breathing difficulties or shortness of breath, dizziness, ankle swelling, uncontrolled high or unusually low blood pressure, and/or palpitations. If you are experiencing any of these symptoms, cancel your procedure and contact your primary care physician for an evaluation.  Remember:  Regular Business hours are:  Monday to Thursday 8:00 AM to 4:00 PM  Provider's Schedule: Delano MetzFrancisco Dalary Hollar, MD:  Procedure days: Tuesday and Thursday 7:30 AM to 4:00 PM  Edward JollyBilal Lateef, MD:  Procedure days: Monday and Wednesday 7:30 AM to 4:00  PM ____________________________________________________________________________________________

## 2018-05-01 ENCOUNTER — Encounter: Payer: Self-pay | Admitting: Pain Medicine

## 2018-05-06 ENCOUNTER — Encounter: Payer: Self-pay | Admitting: Pain Medicine

## 2018-05-09 ENCOUNTER — Telehealth: Payer: Self-pay | Admitting: *Deleted

## 2018-05-09 NOTE — Telephone Encounter (Signed)
Patient notified that her script was sent to pharmacy.

## 2018-05-30 ENCOUNTER — Encounter: Payer: Self-pay | Admitting: Pain Medicine

## 2018-06-02 NOTE — Progress Notes (Addendum)
Pain Management Virtual Encounter Note - Virtual Visit via Telephone Telehealth (real-time audio visits between healthcare provider and patient).  Patient's Phone No. & Preferred Pharmacy:  [phone] (home); There is no such number on file (mobile).; (Preferred) [phone] No e-mail address on record  [facility] - [place], [place] - [address] [place] [address] Phone: [phone] Fax: [phone]   Pre-screening note:  Our staff contacted Connie Whitney and offered her an "in person", "face-to-face" appointment versus a telephone encounter. She indicated preferring the telephone encounter, at this time.  Reason for Virtual Visit: COVID-19*  Social distancing based on CDC and AMA recommendations.   I contacted Connie Whitney on 06/03/2018 at 8:45 AM via telephone.      I clearly identified myself as Oswaldo Done, MD. I verified that I was speaking with the correct person using two identifiers (Name and date of birth: 12-17-1980).  Advanced Informed Consent I sought verbal advanced consent from Connie Whitney for virtual visit interactions. I informed Connie Whitney of possible security and privacy concerns, risks, and limitations associated with providing "not-in-person" medical evaluation and management services. I also informed Connie Whitney of the availability of "in-person" appointments. Finally, I informed her that there would be a charge for the virtual visit and that she could be  personally, fully or partially, financially responsible for it. Connie Whitney expressed understanding and agreed to proceed.   Historic Elements   Connie Whitney is a 38 y.o. year old, female patient evaluated today after her last encounter by our practice on 05/09/2018. Connie Whitney  has a past medical history of Anemia, Arthritis, Fibromyalgia, Hypertension, Lupus (HCC), Migraines, Neuropathy, and Vitamin D deficiency. She also  has a past surgical history that  includes Dilation and curettage, diagnostic / therapeutic; Cesarean section; Tubal ligation; Hysteroscopy with novasure (N/A, 08/21/2014); Esophagogastroduodenoscopy (egd) with propofol (N/A, 08/02/2016); and Bariatric Surgery. Connie Whitney has a current medication list which includes the following prescription(s): amitriptyline hcl, biotin, carvedilol, gabapentin, ferrous sulfate, linaclotide, lisinopril, magnesium, metformin, orphenadrine, oxycodone-acetaminophen, and potassium chloride. She  reports that she has never smoked. She has never used smokeless tobacco. She reports that she does not drink alcohol or use drugs. Connie Whitney is allergic to lac bovis; milk-related compounds; pravastatin; and venlafaxine.   HPI  I last communicated with her on 04/30/2018. Today, she is being contacted for medication management.  Pharmacotherapy Assessment  Analgesic: Oxycodone/APAP 7.5/325 1 tablet p.o. every 8 hours (22.5 mg/day of oxycodone) MME/day: 33.75 mg/day.   Monitoring: Pharmacotherapy: No side-effects or adverse reactions reported. [place] PMP: PDMP reviewed during this encounter.       Compliance: No problems identified. Effectiveness: Clinically acceptable. Plan: Refer to "POC".  Pertinent Labs  Renal Function Lab Results  Component Value Date   BUN 13 03/06/2018   CREATININE 1.05 (H) 03/06/2018   BCR 12 03/06/2018   GFRAA 77 03/06/2018   GFRNONAA 67 03/06/2018   Hepatic Function Lab Results  Component Value Date   AST 14 03/06/2018   ALT 12 (L) 05/05/2014   ALBUMIN 4.2 03/06/2018   UDS Summary  Date Value Ref Range Status  03/06/2018 FINAL  Final    Comment:    ==================================================================== TOXASSURE COMP DRUG ANALYSIS,UR ==================================================================== Test                             Result       Flag  Units Drug Present   Gabapentin                     PRESENT   Cyclobenzaprine                 PRESENT   Desmethylcyclobenzaprine       PRESENT    Desmethylcyclobenzaprine is an expected metabolite of    cyclobenzaprine.   Acetaminophen                  PRESENT ==================================================================== Test                      Result    Flag   Units      Ref Range   Creatinine              372              mg/dL      >=73 ==================================================================== Declared Medications:  Medication list was not provided. ==================================================================== For clinical consultation, please call [phone]. ====================================================================    Note: Above Lab results reviewed.  Recent imaging  DG Lumbar Spine Complete W/Bend CLINICAL DATA:  38 year old female with pain radiating from the low back to the hip. Patient reports nerve damage.  EXAM: LUMBAR SPINE - COMPLETE WITH BENDING VIEWS  COMPARISON:  Pelvis and right hip series today.  FINDINGS: Upright views. Normal lumbar segmentation. Preserved lumbar lordosis. Subtle retrolisthesis of L5 on S1. Lateral views in neutral flexion and extension positioning. No abnormal motion.  Relatively preserved disc spaces. No pars fracture. Intermittent minor endplate spurring. Sacral ala and SI joints appear normal. Visible lower thoracic levels appear intact. Negative visible abdominal visceral contours. Mild degenerative acetabular spurring redemonstrated.  IMPRESSION: No acute osseous abnormality identified in the lumbar spine. Relatively preserved disc spaces and mild for age degenerative osseous changes.  Electronically Signed   By: Odessa Fleming M.D.   On: 03/07/2018 09:13 DG Shoulder Right CLINICAL DATA:  38 year old female with pain radiating from right shoulder. Patient reports nerve damage.  EXAM: RIGHT SHOULDER - 2+ VIEW  COMPARISON:  Chest radiographs 09/12/2017.  FINDINGS: Bone  mineralization is within normal limits. There is no evidence of fracture or dislocation. There is no evidence of arthropathy or other focal bone abnormality. Negative visible right chest.  IMPRESSION: Negative.  Electronically Signed   By: Odessa Fleming M.D.   On: 03/07/2018 08:58 DG HIP UNILAT W OR W/O PELVIS 2-3 VIEWS RIGHT CLINICAL DATA:  38 year old female with pain radiating from the low back to the hip. Patient reports nerve damage.  EXAM: DG HIP (WITH OR WITHOUT PELVIS) 2-3V RIGHT  COMPARISON:  None.  FINDINGS: Bone mineralization is within normal limits. Femoral heads are normally located. There is bilateral degenerative acetabular spurring but the hip joint spaces appear relatively preserved. Intact proximal right femur. Grossly intact visible proximal left femur. Pelvis appears intact. Sacral ala and SI joints appear normal. No acute osseous abnormality identified. Negative lower abdominal and pelvic visceral contours.  IMPRESSION: No acute osseous abnormality identified. Mild degenerative acetabular spurring at both hips.  Electronically Signed   By: Odessa Fleming M.D.   On: 03/07/2018 08:55  Assessment  The primary encounter diagnosis was Chronic pain syndrome. Diagnoses of Chronic low back pain (Primary area of Pain) (Bilateral) (R>L) w/ sciatica (Right), Chronic lower extremity pain (Secondary Area of Pain) (Right), Chronic hip pain (Tertiary Area of Pain) (Right), Chronic ankle pain (Fourth Area of Pain) (Right),  Chronic musculoskeletal pain, and Neurogenic pain were also pertinent to this visit.  Plan of Care  I am having Connie Whitney maintain her IRON PO, carvedilol, metFORMIN, AMITRIPTYLINE HCL PO, linaclotide, Biotin, potassium chloride, Magnesium, lisinopril, orphenadrine, gabapentin, and oxyCODONE-acetaminophen.  Pharmacotherapy (Medications Ordered): Meds ordered this encounter  Medications  . oxyCODONE-acetaminophen (PERCOCET) 7.5-325 MG tablet    Sig: Take  1 tablet by mouth every 8 (eight) hours as needed for up to 30 days for moderate pain or severe pain. Must last 30 days.    Dispense:  90 tablet    Refill:  0    Chronic Pain. (STOP Act - Not applicable). Fill one day early if closed on scheduled refill date. Do not fill until: 06/10/18. To last until: 07/10/18.   Orders:  No orders of the defined types were placed in this encounter.  Follow-up plan:   Return in about 1 month (around 07/03/2018) for (1 mo) Med-Mgmt, (Virtual Visit).    I discussed the assessment and treatment plan with the patient. The patient was provided an opportunity to ask questions and all were answered. The patient agreed with the plan and demonstrated an understanding of the instructions.  Patient advised to call back or seek an in-person evaluation if the symptoms or condition worsens.  Total duration of non-face-to-face encounter: 12 minutes.  Note by: Oswaldo DoneFrancisco A Kenlee Maler, MD Date: 06/03/2018; Time: 9:00 AM  Note: This dictation was prepared with Dragon dictation. Any transcriptional errors that may result from this process are unintentional.   Disclaimer:  * Given the special circumstances of the COVID-19 pandemic, the federal government has announced that the Office for Civil Rights (OCR) will exercise its enforcement discretion and will not impose penalties on physicians using telehealth in the event of noncompliance with regulatory requirements under the [employer] Insurance Portability and Accountability Act (HIPAA) in connection with the good faith provision of telehealth during the COVID-19 national public health emergency. (AMA)

## 2018-06-03 ENCOUNTER — Other Ambulatory Visit: Payer: Self-pay

## 2018-06-03 ENCOUNTER — Ambulatory Visit: Payer: Medicaid Other | Attending: Pain Medicine | Admitting: Pain Medicine

## 2018-06-03 DIAGNOSIS — M5441 Lumbago with sciatica, right side: Secondary | ICD-10-CM

## 2018-06-03 DIAGNOSIS — M792 Neuralgia and neuritis, unspecified: Secondary | ICD-10-CM

## 2018-06-03 DIAGNOSIS — M79604 Pain in right leg: Secondary | ICD-10-CM | POA: Diagnosis not present

## 2018-06-03 DIAGNOSIS — M25571 Pain in right ankle and joints of right foot: Secondary | ICD-10-CM

## 2018-06-03 DIAGNOSIS — M7918 Myalgia, other site: Secondary | ICD-10-CM

## 2018-06-03 DIAGNOSIS — M25551 Pain in right hip: Secondary | ICD-10-CM | POA: Diagnosis not present

## 2018-06-03 DIAGNOSIS — G894 Chronic pain syndrome: Secondary | ICD-10-CM

## 2018-06-03 DIAGNOSIS — G8929 Other chronic pain: Secondary | ICD-10-CM

## 2018-06-03 MED ORDER — OXYCODONE-ACETAMINOPHEN 7.5-325 MG PO TABS: 1.0000 | ORAL_TABLET | Freq: Three times a day (TID) | ORAL | 0 refills | Status: DC | PRN

## 2018-06-03 NOTE — Patient Instructions (Addendum)
____________________________________________________________________________________________  Pain Prevention Technique  Definition:   A technique used to minimize the effects of an activity known to cause inflammation or swelling, which in turn leads to an increase in pain.  Purpose: To prevent swelling from occurring. It is based on the fact that it is easier to prevent swelling from happening than it is to get rid of it, once it occurs.  Contraindications: 1. Anyone with allergy or hypersensitivity to the recommended medications. 2. Anyone taking anticoagulants (Blood Thinners) (e.g., Coumadin, Warfarin, Plavix, etc.). 3. Patients in Renal Failure.  Technique: Before you undertake an activity known to cause pain, or a flare-up of your chronic pain, and before you experience any pain, do the following:  1. On a full stomach, take 4 (four) over the counter Ibuprofens 200mg tablets (Motrin), for a total of 800 mg. 2. In addition, take over the counter Magnesium 400 to 500 mg, before doing the activity.  3. Six (6) hours later, again on a full stomach, repeat the Ibuprofen. 4. That night, take a warm shower and stretch under the running warm water.  This technique may be sufficient to abort the pain and discomfort before it happens. Keep in mind that it takes a lot less medication to prevent swelling than it takes to eliminate it once it occurs.  ____________________________________________________________________________________________    ____________________________________________________________________________________________  Medication Rules  Purpose: To inform patients, and their family members, of our rules and regulations.  Applies to: All patients receiving prescriptions (written or electronic).  Pharmacy of record: Pharmacy where electronic prescriptions will be sent. If written prescriptions are taken to a different pharmacy, please inform the nursing staff. The  pharmacy listed in the electronic medical record should be the one where you would like electronic prescriptions to be sent.  Electronic prescriptions: In compliance with the [place] Strengthen Opioid Misuse Prevention (STOP) Act of 2017 (Session Law 2017-74/H243), effective January 02, 2018, all controlled substances must be electronically prescribed. Calling prescriptions to the pharmacy will cease to exist.  Prescription refills: Only during scheduled appointments. Applies to all prescriptions.  NOTE: The following applies primarily to controlled substances (Opioid* Pain Medications).   Patient's responsibilities: 1. Pain Pills: Bring all pain pills to every appointment (except for procedure appointments). 2. Pill Bottles: Bring pills in original pharmacy bottle. Always bring the newest bottle. Bring bottle, even if empty. 3. Medication refills: You are responsible for knowing and keeping track of what medications you take and those you need refilled. The day before your appointment: write a list of all prescriptions that need to be refilled. The day of the appointment: give the list to the admitting nurse. Prescriptions will be written only during appointments. No prescriptions will be written on procedure days. If you forget a medication: it will not be "Called in", "Faxed", or "electronically sent". You will need to get another appointment to get these prescribed. No early refills. Do not call asking to have your prescription filled early. 4. Prescription Accuracy: You are responsible for carefully inspecting your prescriptions before leaving our office. Have the discharge nurse carefully go over each prescription with you, before taking them home. Make sure that your name is accurately spelled, that your address is correct. Check the name and dose of your medication to make sure it is accurate. Check the number of pills, and the written instructions to make sure they are clear and  accurate. Make sure that you are given enough medication to last until your next medication refill appointment. 5.   Taking Medication: Take medication as prescribed. When it comes to controlled substances, taking less pills or less frequently than prescribed is permitted and encouraged. Never take more pills than instructed. Never take medication more frequently than prescribed.  6. Inform other Doctors: Always inform, all of your healthcare providers, of all the medications you take. 7. Pain Medication from other Providers: You are not allowed to accept any additional pain medication from any other Doctor or Healthcare provider. There are two exceptions to this rule. (see below) In the event that you require additional pain medication, you are responsible for notifying us, as stated below. 8. Medication Agreement: You are responsible for carefully reading and following our Medication Agreement. This must be signed before receiving any prescriptions from our practice. Safely store a copy of your signed Agreement. Violations to the Agreement will result in no further prescriptions. (Additional copies of our Medication Agreement are available upon request.) 9. Laws, Rules, & Regulations: All patients are expected to follow all Federal and State Laws, Statutes, Rules, & Regulations. Ignorance of the Laws does not constitute a valid excuse. The use of any illegal substances is prohibited. 10. Adopted CDC guidelines & recommendations: Target dosing levels will be at or below 60 MME/day. Use of benzodiazepines** is not recommended.  Exceptions: There are only two exceptions to the rule of not receiving pain medications from other Healthcare Providers. 1. Exception #1 (Emergencies): In the event of an emergency (i.e.: accident requiring emergency care), you are allowed to receive additional pain medication. However, you are responsible for: As soon as you are able, call our office [phone], at any time of  the day or night, and leave a message stating your name, the date and nature of the emergency, and the name and dose of the medication prescribed. In the event that your call is answered by a member of our staff, make sure to document and save the date, time, and the name of the person that took your information.  2. Exception #2 (Planned Surgery): In the event that you are scheduled by another doctor or dentist to have any type of surgery or procedure, you are allowed (for a period no longer than 30 days), to receive additional pain medication, for the acute post-op pain. However, in this case, you are responsible for picking up a copy of our "Post-op Pain Management for Surgeons" handout, and giving it to your surgeon or dentist. This document is available at our office, and does not require an appointment to obtain it. Simply go to our office during business hours (Monday-Thursday from 8:00 AM to 4:00 PM) (Friday 8:00 AM to 12:00 Noon) or if you have a scheduled appointment with us, prior to your surgery, and ask for it by name. In addition, you will need to provide us with your name, name of your surgeon, type of surgery, and date of procedure or surgery.  *Opioid medications include: morphine, codeine, oxycodone, oxymorphone, hydrocodone, hydromorphone, meperidine, tramadol, tapentadol, buprenorphine, fentanyl, methadone. **Benzodiazepine medications include: diazepam (Valium), alprazolam (Xanax), clonazepam (Klonopine), lorazepam (Ativan), clorazepate (Tranxene), chlordiazepoxide (Librium), estazolam (Prosom), oxazepam (Serax), temazepam (Restoril), triazolam (Halcion) (Last updated: 03/01/2017) ____________________________________________________________________________________________   ____________________________________________________________________________________________  Medication Recommendations and Reminders  Applies to: All patients receiving prescriptions (written and/or  electronic).  Medication Rules & Regulations: These rules and regulations exist for your safety and that of others. They are not flexible and neither are we. Dismissing or ignoring them will be considered "non-compliance" with medication therapy, resulting in complete and irreversible termination   of such therapy. (See document titled "Medication Rules" for more details.) In all conscience, because of safety reasons, we cannot continue providing a therapy where the patient does not follow instructions.  Pharmacy of record:   Definition: This is the pharmacy where your electronic prescriptions will be sent.   We do not endorse any particular pharmacy.  You are not restricted in your choice of pharmacy.  The pharmacy listed in the electronic medical record should be the one where you want electronic prescriptions to be sent.  If you choose to change pharmacy, simply notify our nursing staff of your choice of new pharmacy.  Recommendations:  Keep all of your pain medications in a safe place, under lock and key, even if you live alone.   After you fill your prescription, take 1 week's worth of pills and put them away in a safe place. You should keep a separate, properly labeled bottle for this purpose. The remainder should be kept in the original bottle. Use this as your primary supply, until it runs out. Once it's gone, then you know that you have 1 week's worth of medicine, and it is time to come in for a prescription refill. If you do this correctly, it is unlikely that you will ever run out of medicine.  To make sure that the above recommendation works, it is very important that you make sure your medication refill appointments are scheduled at least 1 week before you run out of medicine. To do this in an effective manner, make sure that you do not leave the office without scheduling your next medication management appointment. Always ask the nursing staff to show you in your prescription , when  your medication will be running out. Then arrange for the receptionist to get you a return appointment, at least 7 days before you run out of medicine. Do not wait until you have 1 or 2 pills left, to come in. This is very poor planning and does not take into consideration that we may need to cancel appointments due to bad weather, sickness, or emergencies affecting our staff.  "Partial Fill": If for any reason your pharmacy does not have enough pills/tablets to completely fill or refill your prescription, do not allow for a "partial fill". You will need a separate prescription to fill the remaining amount, which we will not provide. If the reason for the partial fill is your insurance, you will need to talk to the pharmacist about payment alternatives for the remaining tablets, but again, do not accept a partial fill.  Prescription refills and/or changes in medication(s):   Prescription refills, and/or changes in dose or medication, will be conducted only during scheduled medication management appointments. (Applies to both, written and electronic prescriptions.)  No refills on procedure days. No medication will be changed or started on procedure days. No changes, adjustments, and/or refills will be conducted on a procedure day. Doing so will interfere with the diagnostic portion of the procedure.  No phone refills. No medications will be "called into the pharmacy".  No Fax refills.  No weekend refills.  No Holliday refills.  No after hours refills.  Remember:  Business hours are:  Monday to Thursday 8:00 AM to 4:00 PM Provider's Schedule: Crystal King, NP - Appointments are:  Medication management: Monday to Thursday 8:00 AM to 4:00 PM Sidra Oldfield, MD - Appointments are:  Medication management: Monday and Wednesday 8:00 AM to 4:00 PM Procedure day: Tuesday and Thursday 7:30 AM to 4:00 PM Bilal   Lateef, MD - Appointments are:  Medication management: Tuesday and Thursday 8:00 AM to  4:00 PM Procedure day: Monday and Wednesday 7:30 AM to 4:00 PM (Last update: 03/01/2017) ____________________________________________________________________________________________   ____________________________________________________________________________________________  CANNABIDIOL (AKA: CBD Oil or Pills)  Applies to: All patients receiving prescriptions of controlled substances (written and/or electronic).  General Information: Cannabidiol (CBD) was discovered in 1940. It is one of some 113 identified cannabinoids in cannabis (Marijuana) plants, accounting for up to 40% of the plant's extract. As of 2018, preliminary clinical research on cannabidiol included studies of anxiety, cognition, movement disorders, and pain.  Cannabidiol is consummed in multiple ways, including inhalation of cannabis smoke or vapor, as an aerosol spray into the cheek, and by mouth. It may be supplied as CBD oil containing CBD as the active ingredient (no added tetrahydrocannabinol (THC) or terpenes), a full-plant CBD-dominant hemp extract oil, capsules, dried cannabis, or as a liquid solution. CBD is thought not have the same psychoactivity as THC, and may affect the actions of THC. Studies suggest that CBD may interact with different biological targets, including cannabinoid receptors and other neurotransmitter receptors. As of 2018 the mechanism of action for its biological effects has not been determined.  In the [place], cannabidiol has a limited approval by the Food and Drug Administration (FDA) for treatment of only two types of epilepsy disorders. The side effects of long-term use of the drug include somnolence, decreased appetite, diarrhea, fatigue, malaise, weakness, sleeping problems, and others.  CBD remains a Schedule I drug prohibited for any use.  Legality: Some manufacturers ship CBD products nationally, an illegal action which the FDA has not enforced in 2018, with CBD remaining the subject  of an FDA investigational new drug evaluation, and is not considered legal as a dietary supplement or food ingredient as of December 2018. Federal illegality has made it difficult historically to conduct research on CBD. CBD is openly sold in head shops and health food stores in some states where such sales have not been explicitly legalized.  Warning: Because it is not FDA approved for general use or treatment of pain, it is not required to undergo the same manufacturing controls as prescription drugs.  This means that the available cannabidiol (CBD) may be contaminated with THC.  If this is the case, it will trigger a positive urine drug screen (UDS) test for cannabinoids (Marijuana).  Because a positive UDS for illicit substances is a violation of our medication agreement, your opioid analgesics (pain medicine) may be permanently discontinued. (Last update: 03/22/2017) ____________________________________________________________________________________________    

## 2018-06-20 ENCOUNTER — Telehealth: Payer: Self-pay

## 2018-06-20 NOTE — Telephone Encounter (Signed)
She got a call from a pharmacy in [place] yesterday saying Dr. Dossie Arbour sent in a script for her. That's not the one she uses. It is supposed to be called in [place] on [address].

## 2018-06-20 NOTE — Telephone Encounter (Signed)
Spoke with patient and let her know that all of the Rx's that I can see have been sent to [facility] on [address].  I have no record of any pharmacy in [place] in her chart.

## 2018-07-02 ENCOUNTER — Encounter: Payer: Self-pay | Admitting: Pain Medicine

## 2018-07-02 NOTE — Progress Notes (Signed)
Pain Management Virtual Encounter Note - Virtual Visit via Telephone Telehealth (real-time audio visits between healthcare provider and patient).   Patient's Phone No. & Preferred Pharmacy:  [phone] (home); There is no such number on file (mobile).; (Preferred) [phone] No e-mail address on record  [place], [place] - [address] [place] [address] Phone: [phone] Fax: [phone]    Pre-screening note:  Our staff contacted Connie Whitney and offered her an "in person", "face-to-face" appointment versus a telephone encounter. She indicated preferring the telephone encounter, at this time.   Reason for Virtual Visit: COVID-19*  Social distancing based on CDC and AMA recommendations.   I contacted Connie Whitney on 07/03/2018 via telephone.      I clearly identified myself as Gaspar Cola, MD. I verified that I was speaking with the correct person using two identifiers (Name: Connie Whitney, and date of birth: March 13, 1980).  Advanced Informed Consent I sought verbal advanced consent from Connie Whitney for virtual visit interactions. I informed Ms. Cory of possible security and privacy concerns, risks, and limitations associated with providing "not-in-person" medical evaluation and management services. I also informed Ms. Andreas of the availability of "in-person" appointments. Finally, I informed her that there would be a charge for the virtual visit and that she could be  personally, fully or partially, financially responsible for it. Ms. Constantine expressed understanding and agreed to proceed.   Historic Elements   Connie Whitney is a 38 y.o. year old, female patient evaluated today after her last encounter by our practice on 06/20/2018. Connie Whitney  has a past medical history of Anemia, Arthritis, Fibromyalgia, Hypertension, Lupus ([place]), Migraines, Neuropathy, and Vitamin D deficiency. She also  has a past surgical  history that includes Dilation and curettage, diagnostic / therapeutic; Cesarean section; Tubal ligation; Hysteroscopy with novasure (N/A, 08/21/2014); Esophagogastroduodenoscopy (egd) with propofol (N/A, 08/02/2016); and Bariatric Surgery. Connie Whitney has a current medication list which includes the following prescription(s): amitriptyline hcl, biotin, carvedilol, gabapentin, ferrous sulfate, linaclotide, lisinopril, magnesium, metformin, orphenadrine, oxycodone-acetaminophen, and potassium chloride. She  reports that she has never smoked. She has never used smokeless tobacco. She reports that she does not drink alcohol or use drugs. Connie Whitney is allergic to lac bovis; milk-related compounds; pravastatin; and venlafaxine.   HPI  Today, she is being contacted for medication management.  Pharmacotherapy Assessment  Analgesic: Oxycodone/APAP 7.5/325 1 tablet p.o. every 8 hours (22.5 mg/day of oxycodone) MME/day: 33.75 mg/day  Monitoring: Pharmacotherapy: No side-effects or adverse reactions reported. [place] PMP: PDMP reviewed during this encounter.       Compliance: No problems identified. Effectiveness: Clinically acceptable. Plan: Refer to "POC".  Pertinent Labs   SAFETY SCREENING Profile Lab Results  Component Value Date   PREGTESTUR NEGATIVE 08/02/2016   Renal Function Lab Results  Component Value Date   BUN 13 03/06/2018   CREATININE 1.05 (H) 03/06/2018   BCR 12 03/06/2018   GFRAA 77 03/06/2018   GFRNONAA 67 03/06/2018   Hepatic Function Lab Results  Component Value Date   AST 14 03/06/2018   ALT 12 (L) 05/05/2014   ALBUMIN 4.2 03/06/2018   UDS Summary  Date Value Ref Range Status  03/06/2018 FINAL  Final    Comment:    ==================================================================== TOXASSURE COMP DRUG ANALYSIS,UR ==================================================================== Test                             Result  Flag       Units Drug Present    Gabapentin                     PRESENT   Cyclobenzaprine                PRESENT   Desmethylcyclobenzaprine       PRESENT    Desmethylcyclobenzaprine is an expected metabolite of    cyclobenzaprine.   Acetaminophen                  PRESENT ==================================================================== Test                      Result    Flag   Units      Ref Range   Creatinine              372              mg/dL      >=16>=20 ==================================================================== Declared Medications:  Medication list was not provided. ==================================================================== For clinical consultation, please call [phone]. ====================================================================    Note: Above Lab results reviewed.  Recent imaging  DG Lumbar Spine Complete W/Bend CLINICAL DATA:  38 year old female with pain radiating from the low back to the hip. Patient reports nerve damage.  EXAM: LUMBAR SPINE - COMPLETE WITH BENDING VIEWS  COMPARISON:  Pelvis and right hip series today.  FINDINGS: Upright views. Normal lumbar segmentation. Preserved lumbar lordosis. Subtle retrolisthesis of L5 on S1. Lateral views in neutral flexion and extension positioning. No abnormal motion.  Relatively preserved disc spaces. No pars fracture. Intermittent minor endplate spurring. Sacral ala and SI joints appear normal. Visible lower thoracic levels appear intact. Negative visible abdominal visceral contours. Mild degenerative acetabular spurring redemonstrated.  IMPRESSION: No acute osseous abnormality identified in the lumbar spine. Relatively preserved disc spaces and mild for age degenerative osseous changes.  Electronically Signed   By: Odessa FlemingH  Hall M.D.   On: 03/07/2018 09:13 DG Shoulder Right CLINICAL DATA:  38 year old female with pain radiating from right shoulder. Patient reports nerve damage.  EXAM: RIGHT SHOULDER - 2+  VIEW  COMPARISON:  Chest radiographs 09/12/2017.  FINDINGS: Bone mineralization is within normal limits. There is no evidence of fracture or dislocation. There is no evidence of arthropathy or other focal bone abnormality. Negative visible right chest.  IMPRESSION: Negative.  Electronically Signed   By: Odessa FlemingH  Hall M.D.   On: 03/07/2018 08:58 DG HIP UNILAT W OR W/O PELVIS 2-3 VIEWS RIGHT CLINICAL DATA:  38 year old female with pain radiating from the low back to the hip. Patient reports nerve damage.  EXAM: DG HIP (WITH OR WITHOUT PELVIS) 2-3V RIGHT  COMPARISON:  None.  FINDINGS: Bone mineralization is within normal limits. Femoral heads are normally located. There is bilateral degenerative acetabular spurring but the hip joint spaces appear relatively preserved. Intact proximal right femur. Grossly intact visible proximal left femur. Pelvis appears intact. Sacral ala and SI joints appear normal. No acute osseous abnormality identified. Negative lower abdominal and pelvic visceral contours.  IMPRESSION: No acute osseous abnormality identified. Mild degenerative acetabular spurring at both hips.  Electronically Signed   By: Odessa FlemingH  Hall M.D.   On: 03/07/2018 08:55  Assessment  The primary encounter diagnosis was Chronic low back pain (Primary area of Pain) (Bilateral) (R>L) w/ sciatica (Right). Diagnoses of Lumbar facet syndrome (Bilateral), Chronic lower extremity pain (Secondary Area of Pain) (Right), Chronic hip pain (Tertiary Area of Pain) (Right),  Chronic pain syndrome, Chronic musculoskeletal pain, Low serum magnesium level, and Neurogenic pain were also pertinent to this visit.  Plan of Care  I have changed [facility]'s oxyCODONE-acetaminophen and gabapentin. I am also having her maintain her IRON PO, carvedilol, metFORMIN, AMITRIPTYLINE HCL PO, linaclotide, Biotin, potassium chloride, lisinopril, orphenadrine, and Magnesium.  Pharmacotherapy (Medications  Ordered): Meds ordered this encounter  Medications  . orphenadrine (NORFLEX) 100 MG tablet    Sig: Take 1 tablet (100 mg total) by mouth 2 (two) times daily as needed for muscle spasms or mild pain.    Dispense:  60 tablet    Refill:  2    Fill one day early if pharmacy is closed on scheduled refill date. May substitute for generic if available.  Marland Kitchen. oxyCODONE-acetaminophen (PERCOCET) 7.5-325 MG tablet    Sig: Take 1 tablet by mouth every 8 (eight) hours as needed for moderate pain or severe pain. Must last 30 days.    Dispense:  90 tablet    Refill:  0    Chronic Pain: STOP Act (Not applicable) Fill 1 day early if closed on refill date. Do not fill until: 07/30/2018. To last until: 08/29/2018. Avoid benzodiazepines within 8 hours of opioids  . Magnesium 500 MG CAPS    Sig: Take 1 capsule (500 mg total) by mouth 2 (two) times daily at 8 am and 10 pm.    Dispense:  60 capsule    Refill:  2    May substitute with similar over-the-counter product.  Marland Kitchen. gabapentin (NEURONTIN) 300 MG capsule    Sig: Take 3 capsules (900 mg total) by mouth at bedtime.    Dispense:  90 capsule    Refill:  2    Fill one day early if pharmacy is closed on scheduled refill date. May substitute for generic if available.   Orders:  Orders Placed This Encounter  Procedures  . LUMBAR FACET(MEDIAL BRANCH NERVE BLOCK) MBNB    Standing Status:   Future    Standing Expiration Date:   08/03/2018    Scheduling Instructions:     Side: Bilateral     Level: L3-4, L4-5, & L5-S1 Facets (L2, L3, L4, L5, & S1 Medial Branch Nerves)     Sedation: Patient's choice.     Timeframe: ASAA    Order Specific Question:   Where will this procedure be performed?    Answer:   [facility]   Follow-up plan:   Return in about 8 weeks (around 08/28/2018) for (VV), E/M (MM), in addition, Procedure (w/ sedation): (B) L-FCT Blk #1 (07/23/2018).     Interventional management options: Considering: Diagnosticright vs bilateral  intra-articular hip injection Diagnostic midlineLESI Diagnostic bilateral lumbar facet nerve block Possible bilateral lumbar facetRFA   PRN Procedures: None at this time     Recent Visits Date Type Provider Dept  06/03/18 Office Visit Delano MetzNaveira, Amela Handley, MD [facility]  04/30/18 Office Visit Delano MetzNaveira, Biannca Scantlin, MD [facility]  04/22/18 Office Visit Delano MetzNaveira, Angeles Paolucci, MD [facility]  Showing recent visits within past 90 days and meeting all other requirements   Today's Visits Date Type Provider Dept  07/03/18 Office Visit Delano MetzNaveira, Arlyce Circle, MD [facility]  Showing today's visits and meeting all other requirements   Future Appointments Date Type Provider Dept  07/23/18 Appointment Delano MetzNaveira, Jodine Muchmore, MD [facility]  Showing future appointments within next 90 days and meeting all other requirements   I discussed the assessment and treatment plan with the patient. The  patient was provided an opportunity to ask questions and all were answered. The patient agreed with the plan and demonstrated an understanding of the instructions.  Patient advised to call back or seek an in-person evaluation if the symptoms or condition worsens.  Total duration of non-face-to-face encounter: 15 minutes.  Note by: Oswaldo DoneFrancisco A Tanaja Ganger, MD Date: 07/03/2018; Time: 8:58 AM  Note: This dictation was prepared with Dragon dictation. Any transcriptional errors that may result from this process are unintentional.  Disclaimer:  * Given the special circumstances of the COVID-19 pandemic, the federal government has announced that the Office for Civil Rights (OCR) will exercise its enforcement discretion and will not impose penalties on physicians using telehealth in the event of noncompliance with regulatory requirements under the [employer] Insurance Portability and Accountability Act (HIPAA) in connection with the good faith provision of telehealth during the  COVID-19 national public health emergency. (AMA)

## 2018-07-03 ENCOUNTER — Other Ambulatory Visit: Payer: Self-pay

## 2018-07-03 ENCOUNTER — Ambulatory Visit: Payer: Medicaid Other | Attending: Pain Medicine | Admitting: Pain Medicine

## 2018-07-03 DIAGNOSIS — G894 Chronic pain syndrome: Secondary | ICD-10-CM

## 2018-07-03 DIAGNOSIS — M25551 Pain in right hip: Secondary | ICD-10-CM

## 2018-07-03 DIAGNOSIS — M47816 Spondylosis without myelopathy or radiculopathy, lumbar region: Secondary | ICD-10-CM | POA: Diagnosis not present

## 2018-07-03 DIAGNOSIS — M79604 Pain in right leg: Secondary | ICD-10-CM | POA: Diagnosis not present

## 2018-07-03 DIAGNOSIS — M5441 Lumbago with sciatica, right side: Secondary | ICD-10-CM | POA: Diagnosis not present

## 2018-07-03 DIAGNOSIS — M792 Neuralgia and neuritis, unspecified: Secondary | ICD-10-CM

## 2018-07-03 DIAGNOSIS — G8929 Other chronic pain: Secondary | ICD-10-CM

## 2018-07-03 DIAGNOSIS — M7918 Myalgia, other site: Secondary | ICD-10-CM

## 2018-07-03 MED ORDER — ORPHENADRINE CITRATE ER 100 MG PO TB12: 100.0000 mg | ORAL_TABLET | Freq: Two times a day (BID) | ORAL | 2 refills | Status: DC | PRN

## 2018-07-03 MED ORDER — GABAPENTIN 300 MG PO CAPS: 900.0000 mg | ORAL_CAPSULE | Freq: Every day | ORAL | 2 refills | Status: AC

## 2018-07-03 MED ORDER — MAGNESIUM 500 MG PO CAPS: 500.0000 mg | ORAL_CAPSULE | Freq: Two times a day (BID) | ORAL | 2 refills | Status: DC

## 2018-07-03 MED ORDER — OXYCODONE-ACETAMINOPHEN 7.5-325 MG PO TABS: 1.0000 | ORAL_TABLET | Freq: Three times a day (TID) | ORAL | 0 refills | Status: DC | PRN

## 2018-07-03 NOTE — Addendum Note (Signed)
Addended by: Milinda Pointer A on: 07/03/2018 08:59 AM   Modules accepted: Miquel Dunn

## 2018-07-03 NOTE — Addendum Note (Signed)
Addended by: Milinda Pointer A on: 07/03/2018 09:33 AM   Modules accepted: Orders, SmartSet

## 2018-07-03 NOTE — Patient Instructions (Signed)

## 2018-07-08 ENCOUNTER — Other Ambulatory Visit: Payer: Self-pay | Admitting: Pain Medicine

## 2018-07-16 ENCOUNTER — Ambulatory Visit: Payer: Medicaid Other | Admitting: Pain Medicine

## 2018-07-19 ENCOUNTER — Other Ambulatory Visit: Payer: Medicaid Other

## 2018-07-23 ENCOUNTER — Other Ambulatory Visit: Payer: Self-pay

## 2018-07-23 ENCOUNTER — Ambulatory Visit
Admission: RE | Admit: 2018-07-23 | Discharge: 2018-07-23 | Disposition: A | Discharge status: Home / Self Care | Payer: Medicaid Other | Source: Ambulatory Visit | Attending: Pain Medicine | Admitting: Pain Medicine

## 2018-07-23 ENCOUNTER — Encounter: Payer: Self-pay | Admitting: Pain Medicine

## 2018-07-23 ENCOUNTER — Encounter: Payer: Self-pay | Admitting: Cardiology

## 2018-07-23 ENCOUNTER — Ambulatory Visit (HOSPITAL_BASED_OUTPATIENT_CLINIC_OR_DEPARTMENT_OTHER): Payer: Medicaid Other | Admitting: Pain Medicine

## 2018-07-23 VITALS — BP 158/99 | HR 61 | Temp 99.3°F | Resp 16 | Ht 70.0 in | Wt 203.0 lb

## 2018-07-23 DIAGNOSIS — M5441 Lumbago with sciatica, right side: Secondary | ICD-10-CM

## 2018-07-23 DIAGNOSIS — M5137 Other intervertebral disc degeneration, lumbosacral region: Secondary | ICD-10-CM | POA: Insufficient documentation

## 2018-07-23 DIAGNOSIS — M4310 Spondylolisthesis, site unspecified: Secondary | ICD-10-CM | POA: Diagnosis not present

## 2018-07-23 DIAGNOSIS — G8929 Other chronic pain: Secondary | ICD-10-CM

## 2018-07-23 DIAGNOSIS — M47816 Spondylosis without myelopathy or radiculopathy, lumbar region: Secondary | ICD-10-CM

## 2018-07-23 DIAGNOSIS — M47817 Spondylosis without myelopathy or radiculopathy, lumbosacral region: Secondary | ICD-10-CM

## 2018-07-23 MED ORDER — FENTANYL CITRATE (PF) 100 MCG/2ML IJ SOLN
25.0000 ug | INTRAMUSCULAR | Status: DC | PRN
  Administered 2018-07-23: 50 ug via INTRAVENOUS
  Filled 2018-07-23: qty 2

## 2018-07-23 MED ORDER — MIDAZOLAM HCL 5 MG/5ML IJ SOLN
1.0000 mg | INTRAMUSCULAR | Status: DC | PRN
  Administered 2018-07-23: 2 mg via INTRAVENOUS
  Filled 2018-07-23: qty 5

## 2018-07-23 MED ORDER — TRIAMCINOLONE ACETONIDE 40 MG/ML IJ SUSP
80.0000 mg | Freq: Once | INTRAMUSCULAR | Status: AC
  Administered 2018-07-23: 80 mg
  Filled 2018-07-23: qty 2

## 2018-07-23 MED ORDER — LACTATED RINGERS IV SOLN
1000.0000 mL | Freq: Once | INTRAVENOUS | Status: AC
  Administered 2018-07-23: 1000 mL via INTRAVENOUS

## 2018-07-23 MED ORDER — LIDOCAINE HCL 2 % IJ SOLN
20.0000 mL | Freq: Once | INTRAMUSCULAR | Status: AC
  Administered 2018-07-23: 09:00:00 400 mg
  Filled 2018-07-23: qty 20

## 2018-07-23 MED ORDER — ROPIVACAINE HCL 2 MG/ML IJ SOLN
18.0000 mL | Freq: Once | INTRAMUSCULAR | Status: AC
  Administered 2018-07-23: 18 mL via PERINEURAL
  Filled 2018-07-23: qty 20

## 2018-07-23 NOTE — Progress Notes (Signed)
Safety precautions to be maintained throughout the outpatient stay will include: orient to surroundings, keep bed in low position, maintain call bell within reach at all times, provide assistance with transfer out of bed and ambulation.  

## 2018-07-23 NOTE — Progress Notes (Signed)
Patient's Name: Connie Whitney  MRN: [id number]  Referring Provider: [facility]  DOB: [birth year]  [facility]  DOS: 07/23/2018  Note by: Oswaldo DoneFrancisco A Dshaun Reppucci, MD  Service setting: Ambulatory outpatient  Specialty: Interventional Pain Management  Patient type: Established  Location: [facility] (AMB) Pain Management Facility  Visit type: Interventional Procedure   Primary Reason for Visit: Interventional Pain Management Treatment. CC: Back Pain (lower)  Procedure:          Anesthesia, Analgesia, Anxiolysis:  Type: Lumbar Facet, Medial Branch Block(s) #1  Primary Purpose: Diagnostic Region: Posterolateral Lumbosacral Spine Level: L2, L3, L4, L5, & S1 Medial Branch Level(s). Injecting these levels blocks the L3-4, L4-5, and L5-S1 lumbar facet joints. Laterality: Bilateral  Type: Moderate (Conscious) Sedation combined with Local Anesthesia Indication(s): Analgesia and Anxiety Route: Intravenous (IV) IV Access: Secured Sedation: Meaningful verbal contact was maintained at all times during the procedure  Local Anesthetic: Lidocaine 1-2%  Position: Prone   Indications: 1. Lumbar facet syndrome (Bilateral)   2. Spondylosis without myelopathy or radiculopathy, lumbosacral region   3. Chronic low back pain (Primary area of Pain) (Bilateral) (R>L) w/ sciatica (Right)   4. DDD (degenerative disc disease), lumbosacral   5. Grade 1 Retrolisthesis of L5/S1    Pain Score: Pre-procedure: 7 /10 Post-procedure: 0-No pain/10   Pertinent Labs  COVID-19 screennig: No results found for: SARSCOV2NAA  Pre-op Assessment:  Connie Whitney is a 38 y.o. (year old), female patient, seen today for interventional treatment. She  has a past surgical history that includes Dilation and curettage, diagnostic / therapeutic; Cesarean section; Tubal ligation; Hysteroscopy with novasure (N/A, 08/21/2014); Esophagogastroduodenoscopy (egd) with propofol (N/A, 08/02/2016); and Bariatric Surgery.  Connie Whitney has a current medication list which includes the following prescription(s): amitriptyline hcl, biotin, carvedilol, gabapentin, ferrous sulfate, linaclotide, lisinopril, magnesium, metformin, orphenadrine, oxycodone-acetaminophen, and potassium chloride, and the following Facility-Administered Medications: fentanyl and midazolam. Her primarily concern today is the Back Pain (lower)  Initial Vital Signs:  Pulse/HCG Rate: 65ECG Heart Rate: 64 Temp: 99.3 F (37.4 C) Resp: 18 BP: (!) 166/102 SpO2: 100 %  BMI: Estimated body mass index is 29.13 kg/m as calculated from the following:   Height as of this encounter: 5\' 10"  (1.778 m).   Weight as of this encounter: 203 lb (92.1 kg).  Risk Assessment: Allergies: Reviewed. She is allergic to lac bovis; milk-related compounds; pravastatin; and venlafaxine.  Allergy Precautions: None required Coagulopathies: Reviewed. None identified.  Blood-thinner therapy: None at this time Active Infection(s): Reviewed. None identified. Connie Whitney is afebrile  Site Confirmation: Connie Whitney was asked to confirm the procedure and laterality before marking the site Procedure checklist: Completed Consent: Before the procedure and under the influence of no sedative(s), amnesic(s), or anxiolytics, the patient was informed of the treatment options, risks and possible complications. To fulfill our ethical and legal obligations, as recommended by the American Medical Association's Code of Ethics, I have informed the patient of my clinical impression; the nature and purpose of the treatment or procedure; the risks, benefits, and possible complications of the intervention; the alternatives, including doing nothing; the risk(s) and benefit(s) of the alternative treatment(s) or procedure(s); and the risk(s) and benefit(s) of doing nothing. The patient was provided information about the general risks and possible complications associated with the procedure. These may  include, but are not limited to: failure to achieve desired goals, infection, bleeding, organ or nerve damage, allergic reactions, paralysis, and death. In addition, the patient was informed of those risks  and complications associated to Spine-related procedures, such as failure to decrease pain; infection (i.e.: Meningitis, epidural or intraspinal abscess); bleeding (i.e.: epidural hematoma, subarachnoid hemorrhage, or any other type of intraspinal or peri-dural bleeding); organ or nerve damage (i.e.: Any type of peripheral nerve, nerve root, or spinal cord injury) with subsequent damage to sensory, motor, and/or autonomic systems, resulting in permanent pain, numbness, and/or weakness of one or several areas of the body; allergic reactions; (i.e.: anaphylactic reaction); and/or death. Furthermore, the patient was informed of those risks and complications associated with the medications. These include, but are not limited to: allergic reactions (i.e.: anaphylactic or anaphylactoid reaction(s)); adrenal axis suppression; blood sugar elevation that in diabetics may result in ketoacidosis or comma; water retention that in patients with history of congestive heart failure may result in shortness of breath, pulmonary edema, and decompensation with resultant heart failure; weight gain; swelling or edema; medication-induced neural toxicity; particulate matter embolism and blood vessel occlusion with resultant organ, and/or nervous system infarction; and/or aseptic necrosis of one or more joints. Finally, the patient was informed that Medicine is not an exact science; therefore, there is also the possibility of unforeseen or unpredictable risks and/or possible complications that may result in a catastrophic outcome. The patient indicated having understood very clearly. We have given the patient no guarantees and we have made no promises. Enough time was given to the patient to ask questions, all of which were answered  to the patient's satisfaction. Connie Whitney has indicated that she wanted to continue with the procedure. Attestation: I, the ordering provider, attest that I have discussed with the patient the benefits, risks, side-effects, alternatives, likelihood of achieving goals, and potential problems during recovery for the procedure that I have provided informed consent. Date   Time: 07/23/2018  8:01 AM  Pre-Procedure Preparation:  Monitoring: As per clinic protocol. Respiration, ETCO2, SpO2, BP, heart rate and rhythm monitor placed and checked for adequate function Safety Precautions: Patient was assessed for positional comfort and pressure points before starting the procedure. Time-out: I initiated and conducted the "Time-out" before starting the procedure, as per protocol. The patient was asked to participate by confirming the accuracy of the "Time Out" information. Verification of the correct person, site, and procedure were performed and confirmed by me, the nursing staff, and the patient. "Time-out" conducted as per Joint Commission's Universal Protocol (UP.01.01.01). Time: 1610  Description of Procedure:          Laterality: Bilateral. The procedure was performed in identical fashion on both sides. Levels:  L2, L3, L4, L5, & S1 Medial Branch Level(s) Area Prepped: Posterior Lumbosacral Region Prepping solution: DuraPrep (Iodine Povacrylex [0.7% available iodine] and Isopropyl Alcohol, 74% w/w) Safety Precautions: Aspiration looking for blood return was conducted prior to all injections. At no point did we inject any substances, as a needle was being advanced. Before injecting, the patient was told to immediately notify me if she was experiencing any new onset of "ringing in the ears, or metallic taste in the mouth". No attempts were made at seeking any paresthesias. Safe injection practices and needle disposal techniques used. Medications properly checked for expiration dates. SDV (single dose vial)  medications used. After the completion of the procedure, all disposable equipment used was discarded in the proper designated medical waste containers. Local Anesthesia: Protocol guidelines were followed. The patient was positioned over the fluoroscopy table. The area was prepped in the usual manner. The time-out was completed. The target area was identified using fluoroscopy. A 12-in long,  straight, sterile hemostat was used with fluoroscopic guidance to locate the targets for each level blocked. Once located, the skin was marked with an approved surgical skin marker. Once all sites were marked, the skin (epidermis, dermis, and hypodermis), as well as deeper tissues (fat, connective tissue and muscle) were infiltrated with a small amount of a short-acting local anesthetic, loaded on a 10cc syringe with a 25G, 1.5-in  Needle. An appropriate amount of time was allowed for local anesthetics to take effect before proceeding to the next step. Local Anesthetic: Lidocaine 2.0% The unused portion of the local anesthetic was discarded in the proper designated containers. Technical explanation of process:  L2 Medial Branch Nerve Block (MBB): The target area for the L2 medial branch is at the junction of the postero-lateral aspect of the superior articular process and the superior, posterior, and medial edge of the transverse process of L3. Under fluoroscopic guidance, a Quincke needle was inserted until contact was made with os over the superior postero-lateral aspect of the pedicular shadow (target area). After negative aspiration for blood, 0.5 mL of the nerve block solution was injected without difficulty or complication. The needle was removed intact. L3 Medial Branch Nerve Block (MBB): The target area for the L3 medial branch is at the junction of the postero-lateral aspect of the superior articular process and the superior, posterior, and medial edge of the transverse process of L4. Under fluoroscopic guidance, a  Quincke needle was inserted until contact was made with os over the superior postero-lateral aspect of the pedicular shadow (target area). After negative aspiration for blood, 0.5 mL of the nerve block solution was injected without difficulty or complication. The needle was removed intact. L4 Medial Branch Nerve Block (MBB): The target area for the L4 medial branch is at the junction of the postero-lateral aspect of the superior articular process and the superior, posterior, and medial edge of the transverse process of L5. Under fluoroscopic guidance, a Quincke needle was inserted until contact was made with os over the superior postero-lateral aspect of the pedicular shadow (target area). After negative aspiration for blood, 0.5 mL of the nerve block solution was injected without difficulty or complication. The needle was removed intact. L5 Medial Branch Nerve Block (MBB): The target area for the L5 medial branch is at the junction of the postero-lateral aspect of the superior articular process and the superior, posterior, and medial edge of the sacral ala. Under fluoroscopic guidance, a Quincke needle was inserted until contact was made with os over the superior postero-lateral aspect of the pedicular shadow (target area). After negative aspiration for blood, 0.5 mL of the nerve block solution was injected without difficulty or complication. The needle was removed intact. S1 Medial Branch Nerve Block (MBB): The target area for the S1 medial branch is at the posterior and inferior 6 o'clock position of the L5-S1 facet joint. Under fluoroscopic guidance, the Quincke needle inserted for the L5 MBB was redirected until contact was made with os over the inferior and postero aspect of the sacrum, at the 6 o' clock position under the L5-S1 facet joint (Target area). After negative aspiration for blood, 0.5 mL of the nerve block solution was injected without difficulty or complication. The needle was removed  intact.  Nerve block solution: 0.2% PF-Ropivacaine + Triamcinolone (40 mg/mL) diluted to a final concentration of 4 mg of Triamcinolone/mL of Ropivacaine The unused portion of the solution was discarded in the proper designated containers. Procedural Needles: 22-gauge, 3.5-inch, Quincke needles used for  all levels.  Once the entire procedure was completed, the treated area was cleaned, making sure to leave some of the prepping solution back to take advantage of its long term bactericidal properties.   Illustration of the posterior view of the lumbar spine and the posterior neural structures. Laminae of L2 through S1 are labeled. DPRL5, dorsal primary ramus of L5; DPRS1, dorsal primary ramus of S1; DPR3, dorsal primary ramus of L3; FJ, facet (zygapophyseal) joint L3-L4; I, inferior articular process of L4; LB1, lateral branch of dorsal primary ramus of L1; IAB, inferior articular branches from L3 medial branch (supplies L4-L5 facet joint); IBP, intermediate branch plexus; MB3, medial branch of dorsal primary ramus of L3; NR3, third lumbar nerve root; S, superior articular process of L5; SAB, superior articular branches from L4 (supplies L4-5 facet joint also); TP3, transverse process of L3.  Vitals:   07/23/18 0845 07/23/18 0855 07/23/18 0905 07/23/18 0914  BP: 135/85 (!) 150/97 (!) 149/95 (!) 158/99  Pulse: 61     Resp: Temp:      TempSrc:      SpO2: 97% 97% 98% 99%  Weight:      Height:         Start Time: 0837 hrs. End Time: 0845 hrs.  Imaging Guidance (Spinal):          Type of Imaging Technique: Fluoroscopy Guidance (Spinal) Indication(s): Assistance in needle guidance and placement for procedures requiring needle placement in or near specific anatomical locations not easily accessible without such assistance. Exposure Time: Please see nurses notes. Contrast: None used. Fluoroscopic Guidance: I was personally present during the use of fluoroscopy. "Tunnel Vision  Technique" used to obtain the best possible view of the target area. Parallax error corrected before commencing the procedure. "Direction-depth-direction" technique used to introduce the needle under continuous pulsed fluoroscopy. Once target was reached, antero-posterior, oblique, and lateral fluoroscopic projection used confirm needle placement in all planes. Images permanently stored in EMR. Interpretation: No contrast injected. I personally interpreted the imaging intraoperatively. Adequate needle placement confirmed in multiple planes. Permanent images saved into the patient's record.  Antibiotic Prophylaxis:   Anti-infectives (From admission, onward)   None     Indication(s): None identified  Post-operative Assessment:  Post-procedure Vital Signs:  Pulse/HCG Rate: 6164 Temp: 99.3 F (37.4 C) Resp: 16 BP: (!) 158/99 SpO2: 99 %  EBL: None  Complications: No immediate post-treatment complications observed by team, or reported by patient.  Note: The patient tolerated the entire procedure well. A repeat set of vitals were taken after the procedure and the patient was kept under observation following institutional policy, for this type of procedure. Post-procedural neurological assessment was performed, showing return to baseline, prior to discharge. The patient was provided with post-procedure discharge instructions, including a section on how to identify potential problems. Should any problems arise concerning this procedure, the patient was given instructions to immediately contact us, at any time, without hesitation. In any case, we plan to contact the patient by telephone for a follow-up status report regarding this interventional procedure.  Comments:  No additional relevant information.  Plan of Care  Orders:  Orders Placed This Encounter  Procedures   LUMBAR FACET(MEDIAL BRANCH NERVE BLOCK) MBNB    Scheduling Instructions:     Side: Bilateral     Level: L3-4, L4-5, & L5-S1  Facets (L2, L3, L4, L5, & S1 Medial Branch Nerves)     Sedation: With Sedation.     Timeframe: Today  Order Specific Question:   Where will this procedure be performed?    Answer:   [facility]   DG PAIN CLIN[facility] C-ARM 1-60 MIN NO REPORT    Intraoperative interpretation by procedural physician at [facility].    Standing Status:   Standing    Number of Occurrences:   1    Order Specific Question:   Reason for exam:    Answer:   Assistance in needle guidance and placement for procedures requiring needle placement in or near specific anatomical locations not easily accessible without such assistance.   Provider attestation of informed consent for procedure/surgical case    I, the ordering provider, attest that I have discussed with the patient the benefits, risks, side effects, alternatives, likelihood of achieving goals and potential problems during recovery for the procedure that I have provided informed consent.    Standing Status:   Standing    Number of Occurrences:   1   Informed Consent Details: Transcribe to consent form and obtain patient signature    Standing Status:   Standing    Number of Occurrences:   1    Order Specific Question:   Procedure    Answer:   Bilateral Lumbar facet block (medial branch block) under fluoroscopic guidance. (See notes for levels.)    Order Specific Question:   Surgeon    Answer:   Mckenzy Salazar A. Laban EmperorNaveira, MD    Order Specific Question:   Indication/Reason    Answer:   Bilateral low back pain with or without lower extremity pain   Chronic Opioid Analgesic:  Disclaimer:  * Given the special circumstances of the COVID-19 pandemic, the federal government has announced that the Office for Civil Rights (OCR) will exercise its enforcement discretion and will not impose penalties on physicians using telehealth in the event of noncompliance with regulatory requirements under the [employer] Insurance Portability and Accountability Act (HIPAA) in  connection with the good faith provision of telehealth during the COVID-19 national public health emergency. (AMA)     Medications ordered for procedure: Meds ordered this encounter  Medications   lidocaine (XYLOCAINE) 2 % (with pres) injection 400 mg   lactated ringers infusion 1,000 mL   midazolam (VERSED) 5 MG/5ML injection 1-2 mg    Make sure Flumazenil is available in the pyxis when using this medication. If oversedation occurs, administer 0.2 mg IV over 15 sec. If after 45 sec no response, administer 0.2 mg again over 1 min; may repeat at 1 min intervals; not to exceed 4 doses (1 mg)   fentaNYL (SUBLIMAZE) injection 25-50 mcg    Make sure Narcan is available in the pyxis when using this medication. In the event of respiratory depression (RR< 8/min): Titrate NARCAN (naloxone) in increments of 0.1 to 0.2 mg IV at 2-3 minute intervals, until desired degree of reversal.   ropivacaine (PF) 2 mg/mL (0.2%) (NAROPIN) injection 18 mL   triamcinolone acetonide (KENALOG-40) injection 80 mg   Medications administered: We administered lidocaine, lactated ringers, midazolam, fentaNYL, ropivacaine (PF) 2 mg/mL (0.2%), and triamcinolone acetonide.  See the medical record for exact dosing, route, and time of administration.  Follow-up plan:   Return for (VV), 2 wk PP-F/U Eval.       Interventional management options: Considering: Diagnosticright vs bilateral intra-articular hip injection Diagnostic midlineLESI Diagnostic bilateral lumbar facet nerve block #2 Possible bilateral lumbar facetRFA   PRN Procedures: None at this time      Recent Visits Date Type Provider Dept  07/03/18 Office Visit Delano MetzNaveira, Neli Fofana, MD [facility]  06/03/18 Office Visit Delano MetzNaveira, Alie Hardgrove, MD [facility]  04/30/18 Office Visit Delano MetzNaveira, Nickole Adamek, MD [facility]  Showing recent visits within past 90 days and meeting all other requirements   Today's Visits Date  Type Provider Dept  07/23/18 Procedure visit Delano MetzNaveira, Rickelle Sylvestre, MD [facility]  Showing today's visits and meeting all other requirements   Future Appointments Date Type Provider Dept  08/06/18 Appointment Delano MetzNaveira, Zyanna Leisinger, MD [facility]  08/19/18 Appointment Delano MetzNaveira, Jaydynn Wolford, MD [facility]  Showing future appointments within next 90 days and meeting all other requirements   Disposition: Discharge home  Discharge Date & Time: 07/23/2018; 0915 hrs.   Primary Care Physician: [facility] Note by: Oswaldo DoneFrancisco A Ziah Leandro, MD Date: 07/23/2018; Time: 9:39 AM  Disclaimer:  Medicine is not an exact science. The only guarantee in medicine is that nothing is guaranteed. It is important to note that the decision to proceed with this intervention was based on the information collected from the patient. The Data and conclusions were drawn from the patient's questionnaire, the interview, and the physical examination. Because the information was provided in large part by the patient, it cannot be guaranteed that it has not been purposely or unconsciously manipulated. Every effort has been made to obtain as much relevant data as possible for this evaluation. It is important to note that the conclusions that lead to this procedure are derived in large part from the available data. Always take into account that the treatment will also be dependent on availability of resources and existing treatment guidelines, considered by other Pain Management Practitioners as being common knowledge and practice, at the time of the intervention. For Medico-Legal purposes, it is also important to point out that variation in procedural techniques and pharmacological choices are the acceptable norm. The indications, contraindications, technique, and results of the above procedure should only be interpreted and judged by a  Board-Certified Interventional Pain Specialist with extensive familiarity and expertise in the same exact procedure and technique.

## 2018-07-23 NOTE — Patient Instructions (Addendum)
____________________________________________________________________________________________  Post-Procedure Discharge Instructions  Instructions:  Apply ice:   Purpose: This will minimize any swelling and discomfort after procedure.   When: Day of procedure, as soon as you get home.  How: Fill a plastic sandwich bag with crushed ice. Cover it with a small towel and apply to injection site.  How long: (15 min on, 15 min off) Apply for 15 minutes then remove x 15 minutes.  Repeat sequence on day of procedure, until you go to bed.  Apply heat:   Purpose: To treat any soreness and discomfort from the procedure.  When: Starting the next day after the procedure.  How: Apply heat to procedure site starting the day following the procedure.  How long: May continue to repeat daily, until discomfort goes away.  Food intake: Start with clear liquids (like water) and advance to regular food, as tolerated.   Physical activities: Keep activities to a minimum for the first 8 hours after the procedure. After that, then as tolerated.  Driving: If you have received any sedation, be responsible and do not drive. You are not allowed to drive for 24 hours after having sedation.  Blood thinner: (Applies only to those taking blood thinners) You may restart your blood thinner 6 hours after your procedure.  Insulin: (Applies only to Diabetic patients taking insulin) As soon as you can eat, you may resume your normal dosing schedule.  Infection prevention: Keep procedure site clean and dry. Shower daily and clean area with soap and water.  Post-procedure Pain Diary: Extremely important that this be done correctly and accurately. Recorded information will be used to determine the next step in treatment. For the purpose of accuracy, follow these rules:  Evaluate only the area treated. Do not report or include pain from an untreated area. For the purpose of this evaluation, ignore all other areas of pain,  except for the treated area.  After your procedure, avoid taking a long nap and attempting to complete the pain diary after you wake up. Instead, set your alarm clock to go off every hour, on the hour, for the initial 8 hours after the procedure. Document the duration of the numbing medicine, and the relief you are getting from it.  Do not go to sleep and attempt to complete it later. It will not be accurate. If you received sedation, it is likely that you were given a medication that may cause amnesia. Because of this, completing the diary at a later time may cause the information to be inaccurate. This information is needed to plan your care.  Follow-up appointment: Keep your post-procedure follow-up evaluation appointment after the procedure (usually 2 weeks for most procedures, 6 weeks for radiofrequencies). DO NOT FORGET to bring you pain diary with you.   Expect: (What should I expect to see with my procedure?)  From numbing medicine (AKA: Local Anesthetics): Numbness or decrease in pain. You may also experience some weakness, which if present, could last for the duration of the local anesthetic.  Onset: Full effect within 15 minutes of injected.  Duration: It will depend on the type of local anesthetic used. On the average, 1 to 8 hours.   From steroids (Applies only if steroids were used): Decrease in swelling or inflammation. Once inflammation is improved, relief of the pain will follow.  Onset of benefits: Depends on the amount of swelling present. The more swelling, the longer it will take for the benefits to be seen. In some cases, up to 10 days.    Duration: Steroids will stay in the system x 2 weeks. Duration of benefits will depend on multiple posibilities including persistent irritating factors.  Side-effects: If present, they may typically last 2 weeks (the duration of the steroids).  Frequent: Cramps (if they occur, drink Gatorade and take over-the-counter Magnesium 450-500 mg  once to twice a day); water retention with temporary weight gain; increases in blood sugar; decreased immune system response; increased appetite.  Occasional: Facial flushing (red, warm cheeks); mood swings; menstrual changes.  Uncommon: Long-term decrease or suppression of natural hormones; bone thinning. (These are more common with higher doses or more frequent use. This is why we prefer that our patients avoid having any injection therapies in other practices.)   Very Rare: Severe mood changes; psychosis; aseptic necrosis.  From procedure: Some discomfort is to be expected once the numbing medicine wears off. This should be minimal if ice and heat are applied as instructed.  Call if: (When should I call?)  You experience numbness and weakness that gets worse with time, as opposed to wearing off.  New onset bowel or bladder incontinence. (Applies only to procedures done in the spine)  Emergency Numbers:  Durning business hours (Monday - Thursday, 8:00 AM - 4:00 PM) (Friday, 9:00 AM - 12:00 Noon): [phone]  After hours: [phone]  NOTE: If you are having a problem and are unable connect with, or to talk to a provider, then go to your nearest urgent care or emergency department. If the problem is serious and urgent, please call 911. ____________________________________________________________________________________________   Pain Management Discharge Instructions  General Discharge Instructions :  If you need to reach your doctor call: Monday-Friday 8:00 am - 4:00 pm at [phone] or toll free [phone].  After clinic hours [phone] to have operator reach doctor.  Bring all of your medication bottles to all your appointments in the pain clinic.  To cancel or reschedule your appointment with Pain Management please remember to call 24 hours in advance to avoid a fee.  Refer to the educational materials which you have been given on: General Risks, I had my  Procedure. Discharge Instructions, Post Sedation.  Post Procedure Instructions:  The drugs you were given will stay in your system until tomorrow, so for the next 24 hours you should not drive, make any legal decisions or drink any alcoholic beverages.  You may eat anything you prefer, but it is better to start with liquids then soups and crackers, and gradually work up to solid foods.  Please notify your doctor immediately if you have any unusual bleeding, trouble breathing or pain that is not related to your normal pain.  Depending on the type of procedure that was done, some parts of your body may feel week and/or numb.  This usually clears up by tonight or the next day.  Walk with the use of an assistive device or accompanied by an adult for the 24 hours.  You may use ice on the affected area for the first 24 hours.  Put ice in a Ziploc bag and cover with a towel and place against area 15 minutes on 15 minutes off.  You may switch to heat after 24 hours.Facet Blocks Patient Information  Description: The facets are joints in the spine between the vertebrae.  Like any joints in the body, facets can become irritated and painful.  Arthritis can also effect the facets.  By injecting steroids and local anesthetic in and around these joints, we can temporarily block the nerve   supply to them.  Steroids act directly on irritated nerves and tissues to reduce selling and inflammation which often leads to decreased pain.  Facet blocks may be done anywhere along the spine from the neck to the low back depending upon the location of your pain.   After numbing the skin with local anesthetic (like Novocaine), a small needle is passed onto the facet joints under x-ray guidance.  You may experience a sensation of pressure while this is being done.  The entire block usually lasts about 15-25 minutes.   Conditions which may be treated by facet blocks:   Low back/buttock pain  Neck/shoulder pain  Certain  types of headaches  Preparation for the injection:  1. Do not eat any solid food or dairy products within 8 hours of your appointment. 2. You may drink clear liquid up to 3 hours before appointment.  Clear liquids include water, black coffee, juice or soda.  No milk or cream please. 3. You may take your regular medication, including pain medications, with a sip of water before your appointment.  Diabetics should hold regular insulin (if taken separately) and take 1/2 normal NPH dose the morning of the procedure.  Carry some sugar containing items with you to your appointment. 4. A driver must accompany you and be prepared to drive you home after your procedure. 5. Bring all your current medications with you. 6. An IV may be inserted and sedation may be given at the discretion of the physician. 7. A blood pressure cuff, EKG and other monitors will often be applied during the procedure.  Some patients may need to have extra oxygen administered for a short period. 8. You will be asked to provide medical information, including your allergies and medications, prior to the procedure.  We must know immediately if you are taking blood thinners (like Coumadin/Warfarin) or if you are allergic to IV iodine contrast (dye).  We must know if you could possible be pregnant.  Possible side-effects:   Bleeding from needle site  Infection (rare, may require surgery)  Nerve injury (rare)  Numbness & tingling (temporary)  Difficulty urinating (rare, temporary)  Spinal headache (a headache worse with upright posture)  Light-headedness (temporary)  Pain at injection site (serveral days)  Decreased blood pressure (rare, temporary)  Weakness in arm/leg (temporary)  Pressure sensation in back/neck (temporary)   Call if you experience:   Fever/chills associated with headache or increased back/neck pain  Headache worsened by an upright position  New onset, weakness or numbness of an extremity  below the injection site  Hives or difficulty breathing (go to the emergency room)  Inflammation or drainage at the injection site(s)  Severe back/neck pain greater than usual  New symptoms which are concerning to you  Please note:  Although the local anesthetic injected can often make your back or neck feel good for several hours after the injection, the pain will likely return. It takes 3-7 days for steroids to work.  You may not notice any pain relief for at least one week.  If effective, we will often do a series of 2-3 injections spaced 3-6 weeks apart to maximally decrease your pain.  After the initial series, you may be a candidate for a more permanent nerve block of the facets.  If you have any questions, please call [phone] [facility] 

## 2018-07-24 ENCOUNTER — Telehealth: Payer: Self-pay

## 2018-07-24 NOTE — Telephone Encounter (Signed)
Pt was called and no problems reported. 

## 2018-08-05 ENCOUNTER — Encounter: Payer: Self-pay | Admitting: Pain Medicine

## 2018-08-06 ENCOUNTER — Other Ambulatory Visit: Payer: Self-pay

## 2018-08-06 ENCOUNTER — Ambulatory Visit: Payer: Medicaid Other | Attending: Pain Medicine | Admitting: Pain Medicine

## 2018-08-06 DIAGNOSIS — M79604 Pain in right leg: Secondary | ICD-10-CM

## 2018-08-06 DIAGNOSIS — G8929 Other chronic pain: Secondary | ICD-10-CM

## 2018-08-06 DIAGNOSIS — M5136 Other intervertebral disc degeneration, lumbar region: Secondary | ICD-10-CM

## 2018-08-06 DIAGNOSIS — Z79899 Other long term (current) drug therapy: Secondary | ICD-10-CM

## 2018-08-06 DIAGNOSIS — M25571 Pain in right ankle and joints of right foot: Secondary | ICD-10-CM

## 2018-08-06 DIAGNOSIS — M5441 Lumbago with sciatica, right side: Secondary | ICD-10-CM | POA: Diagnosis not present

## 2018-08-06 DIAGNOSIS — M25551 Pain in right hip: Secondary | ICD-10-CM

## 2018-08-06 DIAGNOSIS — G894 Chronic pain syndrome: Secondary | ICD-10-CM

## 2018-08-06 DIAGNOSIS — M549 Dorsalgia, unspecified: Secondary | ICD-10-CM

## 2018-08-06 MED ORDER — OXYCODONE-ACETAMINOPHEN 7.5-325 MG PO TABS: 1.0000 | ORAL_TABLET | Freq: Three times a day (TID) | ORAL | 0 refills | Status: DC | PRN

## 2018-08-06 NOTE — Progress Notes (Signed)
Pain Management Virtual Encounter Note - Virtual Visit via Telephone Telehealth (real-time audio visits between healthcare provider and patient).   Patient's Phone No. & Preferred Pharmacy:  [phone] (home); [phone] (mobile); (Preferred) [phone] No e-mail address on record  [facility] [place] [address] [place] [address] Phone: [phone] Fax: [phone]    Pre-screening note:  Our staff contacted Connie Whitney and offered her an "in person", "face-to-face" appointment versus a telephone encounter. She indicated preferring the telephone encounter, at this time.   Reason for Virtual Visit: COVID-19*  Social distancing based on CDC and AMA recommendations.   I contacted Connie Peteriffany Miley on 08/06/2018 via telephone.      I clearly identified myself as Oswaldo DoneFrancisco A Emmalee Solivan, MD. I verified that I was speaking with the correct person using two identifiers (Name: Connie Whitney, and date of birth: 17-May-1980).  Advanced Informed Consent I sought verbal advanced consent from Connie Whitney for virtual visit interactions. I informed Connie Whitney of possible security and privacy concerns, risks, and limitations associated with providing "not-in-person" medical evaluation and management services. I also informed Connie Whitney of the availability of "in-person" appointments. Finally, I informed her that there would be a charge for the virtual visit and that she could be  personally, fully or partially, financially responsible for it. Connie Whitney expressed understanding and agreed to proceed.   Historic Elements   Connie Whitney is a 38 y.o. year old, female patient evaluated today after her last encounter by our practice on 07/24/2018. Connie Whitney  has a past medical history of Anemia, Arthritis, Fibromyalgia, Hypertension, Lupus (HCC), Migraines, Neuropathy, and Vitamin D deficiency. She also  has a past surgical history that  includes Dilation and curettage, diagnostic / therapeutic; Cesarean section; Tubal ligation; Hysteroscopy with novasure (N/A, 08/21/2014); Esophagogastroduodenoscopy (egd) with propofol (N/A, 08/02/2016); and Bariatric Surgery. Connie Whitney has a current medication list which includes the following prescription(s): amitriptyline hcl, biotin, carvedilol, gabapentin, ferrous sulfate, linaclotide, lisinopril, magnesium, metformin, orphenadrine, oxycodone-acetaminophen, and potassium chloride. She  reports that she has never smoked. She has never used smokeless tobacco. She reports that she does not drink alcohol or use drugs. Connie Whitney is allergic to lac bovis; milk-related compounds; pravastatin; and venlafaxine.   HPI  Today, she is being contacted for both, medication management and a post-procedure assessment.  The results initially described by the patient, would suggest the etiology of the low back pain to be more proximal than the facet joint.  This would mean within the lumbar canal.  We could try a lumbar epidural steroid injection to see if this would help the patient.  However, if we get similar results, it would suggest more of a nonphysiological response, which would be worrisome.  After carefully evaluating all of the patient's available imaging, as well as the results of this recent diagnostic block, I am finding it difficult to justify continuing the patient on oxycodone/APAP 7.5/325 every 8 hours.  To start with, it is clear that this patient probably has some tolerance to the opioids.  Today we had a conversation regarding tolerance and "Drug Holidays".  The patient was provided with 1 more prescription on the oxycodone and a prescription for meloxicam to see if we can start weaning her off of the oxycodone and into a non-opioid analgesic.  Unfortunately, today as I spoke to the patient and explained to her my interpretation of the results that she gave us, I noticed that she became  very defensive  and started questioning why it seem that she was getting contradictory information from the [facility].  I informed the patient that I cannot justify or explain the reasoning.  She did question whether or not I had read any of the information that they had sent and I explained to her that I cannot recall having seen anything from them specific to her case.  She then escalated this to questioning whether or not I had actually read anything from her chart and it was at this point that I basically informed her that I was noticing that her tone was getting rather offensive.  To the escalate things I took the time to go over the entire chart looking for the information that she assumed we had since she had signed a release form for the "[facility]" Tonye Royalty, M.D.) notes.  Eventually I found a copy of the release form that she signed and I confirmed that he had been sent out but that there was no information from the "[facility]".   I was very clear with her that I would be looking at other possible causes of the pain that she was describing, but I was also very clear that should everything come back negative, it is my intention to then taper the opioids down and stop them since there would be no clear justification for the use.  Today I will be ordering an MRI of the lumbar spine to see if there has been any changes from the one that she had done in 2018.  I will also order some x-rays of the thoracic spine since she indicated experiencing shooting pains towards the upper portion of the back.  The one thing that seems to be clear from the diagnostic bilateral lumbar facet block that she had done, assuming that the information that she provided me with me is correct, this test was negative for the facet joints being the etiology of her pain.  This also would include pain coming from the L5-S1 retrolisthesis.  Post-Procedure Evaluation  Procedure:  Diagnostic bilateral lumbar facet block #1 under fluoroscopic guidance and IV sedation Pre-procedure pain level:  7/10 Post-procedure: 0/10 (100% relief)  Sedation: Sedation provided.  Effectiveness during initial hour after procedure(Ultra-Short Term Relief): 100 %   Local anesthetic used: Long-acting (4-6 hours) Effectiveness: Defined as any analgesic benefit obtained secondary to the administration of local anesthetics. This carries significant diagnostic value as to the etiological location, or anatomical origin, of the pain. Duration of benefit is expected to coincide with the duration of the local anesthetic used.  Effectiveness during initial 4-6 hours after procedure(Short-Term Relief): 0 % (was numb for a couple of hours.)   Long-term benefit: Defined as any relief past the pharmacologic duration of the local anesthetics.  Effectiveness past the initial 6 hours after procedure(Long-Term Relief): 0 %   Current benefits: Defined as benefit that persist at this time.   Analgesia:  25% better Function: Somewhat improved ROM: Somewhat improved  Pharmacotherapy Assessment  Analgesic: Oxycodone/APAP 7.5/325,  1 tab PO q 8 hrs (22.5 mg/day of oxycodone) MME/day:33.75mg /day.   Monitoring: Pharmacotherapy: No side-effects or adverse reactions reported. [place] PMP: PDMP reviewed during this encounter.       Compliance: No problems identified. Effectiveness: Clinically acceptable. Plan: Refer to "POC".  UDS:  Summary  Date Value Ref Range Status  03/06/2018 FINAL  Final    Comment:    ==================================================================== TOXASSURE COMP DRUG ANALYSIS,UR ====================================================================  Test                             Result       Flag       Units Drug Present   Gabapentin                     PRESENT   Cyclobenzaprine                PRESENT   Desmethylcyclobenzaprine       PRESENT    Desmethylcyclobenzaprine is an  expected metabolite of    cyclobenzaprine.   Acetaminophen                  PRESENT ==================================================================== Test                      Result    Flag   Units      Ref Range   Creatinine              372              mg/dL      >=20 ==================================================================== Declared Medications:  Medication list was not provided. ==================================================================== For clinical consultation, please call [phone]. ====================================================================    Laboratory Chemistry Profile (12 mo)  Renal: 03/06/2018: BUN 13; BUN/Creatinine Ratio 12; Creatinine, Ser 1.05; GFR calc Af Amer 77; GFR calc non Af Amer 67  Hepatic: 03/06/2018: Albumin 4.2; AST 14 Other: 03/06/2018: 25-Hydroxy, Vitamin D 30; 25-Hydroxy, Vitamin D-2 14; 25-Hydroxy, Vitamin D-3 16; CRP 5; Sed Rate 50; Vitamin B-12 >2000 Note: Above Lab results reviewed.  Imaging  Last 90 days:  Dg Pain Clinic C-arm 1-60 Min No Report  Result Date: 07/23/2018 Fluoro was used, but no Radiologist interpretation will be provided. Please refer to "NOTES" tab for provider progress note.  Last Hospital Admission:  Dg Pain Clinic C-arm 1-60 Min No Report  Result Date: 07/23/2018 Fluoro was used, but no Radiologist interpretation will be provided. Please refer to "NOTES" tab for provider progress note.  Assessment  The primary encounter diagnosis was Chronic low back pain (Primary area of Pain) (Bilateral) (R>L) w/ sciatica (Right). Diagnoses of Chronic lower extremity pain (Secondary Area of Pain) (Right), Chronic hip pain (Tertiary Area of Pain) (Right), Chronic ankle pain (Fourth Area of Pain) (Right), Chronic pain syndrome, Pharmacologic therapy, Other intervertebral disc degeneration, lumbar region, and Upper back pain, chronic were also pertinent to this visit.  Plan of Care  I am having Connie Whitney  maintain her IRON PO, carvedilol, metFORMIN, AMITRIPTYLINE HCL PO, linaclotide, Biotin, potassium chloride, lisinopril, orphenadrine, Magnesium, gabapentin, and oxyCODONE-acetaminophen.  Pharmacotherapy (Medications Ordered): Meds ordered this encounter  Medications  . oxyCODONE-acetaminophen (PERCOCET) 7.5-325 MG tablet    Sig: Take 1 tablet by mouth every 8 (eight) hours as needed for moderate pain or severe pain. Must last 30 days.    Dispense:  90 tablet    Refill:  0    Chronic Pain: STOP Act (Not applicable) Fill 1 day early if closed on refill date. Do not fill until: 08/29/2018. To last until: 09/28/2018. Avoid benzodiazepines within 8 hours of opioids   Orders:  Orders Placed This Encounter  Procedures  . MR LUMBAR SPINE WO CONTRAST    In addition to any acute findings, please report on degenerative changes related to: (Please specify level(s)) (1) ROM & instability (>49mm displacement) (2) Facet joint (Zygoapophyseal  Joint) (3) DDD and/or IVDD (4) Pars defects (5) Previous surgical changes (Include description of hardware and hardware status, if present) (6) Presence and degree of spondylolisthesis, spondylosis, and/or spondyloarthropathies)  (7) Old Fractures (8) Demineralization (9) Additional bone pathology (10) Stenosis (Central, Lateral Recess, Foraminal) (11) If at all possible, please provide AP diameter (mm) of foraminal and/or central canal.    Standing Status:   Future    Standing Expiration Date:   11/06/2018    Order Specific Question:   What is the patient's sedation requirement?    Answer:   No Sedation    Order Specific Question:   Does the patient have a pacemaker or implanted devices?    Answer:   No    Order Specific Question:   Preferred imaging location?    Answer:   [facility] (table limit-350lbs)    Order Specific Question:   Call Results- Best Contact Number?    Answer:   [phone] [facility])    Order Specific Question:    Radiology Contrast Protocol - do NOT remove file path    Answer:   [email or web address]  . DG Thoracic Spine 2 View    In addition to any acute findings, please report on degenerative changes related to: (Please specify level(s)) (1) ROM & instability (>894mm displacement) (2) Facet joint (Zygoapophyseal Joint) (3) DDD and/or IVDD (4) Pars defects (5) Previous surgical changes (Include description of hardware and hardware status, if present) (6) Presence and degree of spondylolisthesis, spondylosis, and/or spondyloarthropathies)  (7) Old Fractures (8) Demineralization (9) Additional bone pathology (10) Stenosis (Central, Lateral Recess, Foraminal) (11) If at all possible, please provide AP diameter (mm) of foraminal and/or central canal.    Standing Status:   Future    Standing Expiration Date:   08/06/2019    Order Specific Question:   Reason for Exam (SYMPTOM  OR DIAGNOSIS REQUIRED)    Answer:   Upper back pain and/or thoracic spine pain.    Order Specific Question:   Is the patient pregnant?    Answer:   No    Order Specific Question:   Preferred imaging location?    Answer:   [facility]    Order Specific Question:   Call Results- Best Contact Number?    Answer:   [phone] (Patient Clinic facility) (Dr. Laban EmperorNaveira)  . ToxASSURE Select 13 (MW), Urine    Volume: 30 ml(s). Minimum 3 ml of urine is needed. Document temperature of fresh sample. Indications: Long term (current) use of opiate analgesic (N56.213(Z79.891)   Follow-up plan:   Return in about 8 weeks (around 09/28/2018) for (VV), E/M (MM).      Interventional management options: Considering: Diagnosticright vs bilateral intra-articular hip injection Diagnostic midlineLESI Possible bilateral lumbar facetRFA   PRN Procedures: Note: The diagnostic bilateral lumbar facet block done on 07/23/2018 would suggest that her pain is not coming from the posterior elements of the lumbosacral region,  including the L5-S1 retrolisthesis. (See the 08/06/2018 note for details)    Recent Visits Date Type Provider Dept  07/23/18 Procedure visit Delano MetzNaveira, Lynnel Zanetti, MD [facility]  07/03/18 Office Visit Delano MetzNaveira, Tahiry Spicer, MD [facility]  06/03/18 Office Visit Delano MetzNaveira, Annalynn Centanni, MD [facility]  Showing recent visits within past 90 days and meeting all other requirements   Today's Visits Date Type Provider Dept  08/06/18 Office Visit Delano MetzNaveira, Almando Brawley, MD [facility]  Showing today's visits and meeting all other requirements   Future Appointments Date Type Provider Dept  08/19/18 Appointment Delano MetzNaveira, Rickesha Veracruz, MD [facility]  Showing future appointments within next 90 days and meeting all other requirements   I discussed the assessment and treatment plan with the patient. The patient was provided an opportunity to ask questions and all were answered. The patient agreed with the plan and demonstrated an understanding of the instructions.  Patient advised to call back or seek an in-person evaluation if the symptoms or condition worsens.  Total duration of non-face-to-face encounter: 40 minutes.  Note by: Oswaldo DoneFrancisco A Ela Moffat, MD Date: 08/06/2018; Time: 1:52 PM  Note: This dictation was prepared with Dragon dictation. Any transcriptional errors that may result from this process are unintentional.  Disclaimer:  * Given the special circumstances of the COVID-19 pandemic, the federal government has announced that the Office for Civil Rights (OCR) will exercise its enforcement discretion and will not impose penalties on physicians using telehealth in the event of noncompliance with regulatory requirements under the [employer] Insurance Portability and Accountability Act (HIPAA) in connection with the good faith provision of telehealth during the COVID-19 national public health emergency. (AMA)

## 2018-08-06 NOTE — Patient Instructions (Signed)
____________________________________________________________________________________________  Drug Holidays (Slow)  What is a "Drug Holiday"? Drug Holiday: is the name given to the period of time during which a patient stops taking a medication(s) for the purpose of eliminating tolerance to the drug.  Benefits . Improved effectiveness of opioids. . Decreased opioid dose needed to achieve benefits. . Improved pain with lesser dose.  What is tolerance? Tolerance: is the progressive decreased in effectiveness of a drug due to its repetitive use. With repetitive use, the body gets use to the medication and as a consequence, it loses its effectiveness. This is a common problem seen with opioid pain medications. As a result, a larger dose of the drug is needed to achieve the same effect that used to be obtained with a smaller dose.  How long should a "Drug Holiday" last? You should stay off of the pain medicine for at least 14 consecutive days. (2 weeks)  Should I stop the medicine "cold turkey"? No. You should always coordinate with your Pain Specialist so that he/she can provide you with the correct medication dose to make the transition as smoothly as possible.  How do I stop the medicine? Slowly. You will be instructed to decrease the daily amount of pills that you take by one (1) pill every seven (7) days. This is called a "slow downward taper" of your dose. For example: if you normally take four (4) pills per day, you will be asked to drop this dose to three (3) pills per day for seven (7) days, then to two (2) pills per day for seven (7) days, then to one (1) per day for seven (7) days, and at the end of those last seven (7) days, this is when the "Drug Holiday" would start.   Will I have withdrawals? By doing a "slow downward taper" like this one, it is unlikely that you will experience any significant withdrawal symptoms. Typically, what triggers withdrawals is the sudden stop of a high  dose opioid therapy. Withdrawals can usually be avoided by slowly decreasing the dose over a prolonged period of time.  What are withdrawals? Withdrawals: refers to the wide range of symptoms that occur after stopping or dramatically reducing opiate drugs after heavy and prolonged use. Withdrawal symptoms do not occur to patients that use low dose opioids, or those who take the medication sporadically. Contrary to benzodiazepine (example: Valium, Xanax, etc.) or alcohol withdrawals ("Delirium Tremens"), opioid withdrawals are not lethal. Withdrawals are the physical manifestation of the body getting rid of the excess receptors.  Expected Symptoms Early symptoms of withdrawal may include: . Agitation . Anxiety . Muscle aches . Increased tearing . Insomnia . Runny nose . Sweating . Yawning  Late symptoms of withdrawal may include: . Abdominal cramping . Diarrhea . Dilated pupils . Goose bumps . Nausea . Vomiting  Will I experience withdrawals? Due to the slow nature of the taper, it is very unlikely that you will experience any.  What is a slow taper? Taper: refers to the gradual decrease in dose.  ___________________________________________________________________________________________     

## 2018-08-14 ENCOUNTER — Telehealth: Payer: Self-pay | Admitting: *Deleted

## 2018-08-15 ENCOUNTER — Encounter: Payer: Self-pay | Admitting: Pain Medicine

## 2018-08-18 NOTE — Progress Notes (Signed)
Pain Management Virtual Encounter Note - Virtual Visit via Telephone Telehealth (real-time audio visits between healthcare provider and patient).   Patient's Phone No. & Preferred Pharmacy:  [phone] (home); [phone] (mobile); (Preferred) [phone] No e-mail address on record  [facility] [place] - [address] [place] [address] Phone: [phone] Fax: [phone]    Pre-screening note:  Our staff contacted Connie Whitney and offered her an "in person", "face-to-face" appointment versus a telephone encounter. She indicated preferring the telephone encounter, at this time.   Reason for Virtual Visit: COVID-19*  Social distancing based on CDC and AMA recommendations.   I contacted Connie Peteriffany Theilen on 08/19/2018 via telephone.      I clearly identified myself as Oswaldo DoneFrancisco A Arpi Diebold, MD. I verified that I was speaking with the correct person using two identifiers (Name: Connie Peteriffany Whitney, and date of birth: [birth year]).  Advanced Informed Consent I sought verbal advanced consent from Connie Whitney for virtual visit interactions. I informed Connie Whitney of possible security and privacy concerns, risks, and limitations associated with providing "not-in-person" medical evaluation and management services. I also informed Connie Whitney of the availability of "in-person" appointments. Finally, I informed her that there would be a charge for the virtual visit and that she could be  personally, fully or partially, financially responsible for it. Connie Whitney expressed understanding and agreed to proceed.   Historic Elements   Connie Whitney is a 38 y.o. year old, female patient evaluated today after her last encounter by our practice on 08/14/2018. Connie Whitney  has a past medical history of Anemia, Arthritis, Fibromyalgia, Hypertension, Lupus (HCC), Migraines, Neuropathy, and Vitamin D deficiency. She also  has a past surgical history that  includes Dilation and curettage, diagnostic / therapeutic; Cesarean section; Tubal ligation; Hysteroscopy with novasure (N/A, 08/21/2014); Esophagogastroduodenoscopy (egd) with propofol (N/A, 08/02/2016); and Bariatric Surgery. Connie Whitney has a current medication list which includes the following prescription(s): amitriptyline hcl, carvedilol, gabapentin, ferrous sulfate, linaclotide, lisinopril, magnesium, orphenadrine, potassium chloride, and oxycodone. She  reports that she has never smoked. She has never used smokeless tobacco. She reports that she does not drink alcohol or use drugs. Connie Whitney is allergic to lac bovis; milk-related compounds; pravastatin; and venlafaxine.   HPI  Today, she is being contacted for medication management.  Unfortunately, neither the thoracic x-rays nor the MRI are available for evaluation on this visit.  We also have not received any of the notes that we requested from the [facility].  Today we will send a third request.  Today we will also begin to taper down the opioids as previously described.  We will be going from the oxycodone/APAP 7.5 3 times daily (33.75 MME/day) to oxycodone IR 5 mg 4 times daily (30 MME/day).  She will receive a 30-day supply for this and we will go ahead and continue tapering down by 1 pill every 30 days.  Her next prescription will be for oxycodone IR 5 mg 3 times daily (22.5 MME/day).  Pharmacotherapy Assessment  Analgesic: Oxycodone IR 5 mg,  1 tab PO q 6 hrs (20 mg/day of oxycodone) MME/day:30mg /day.   Monitoring: Pharmacotherapy: No side-effects or adverse reactions reported. [place] PMP: PDMP reviewed during this encounter.       Compliance: No problems identified. Effectiveness: Clinically acceptable. Plan: Refer to "POC".  UDS:  Summary  Date Value Ref Range Status  03/06/2018 FINAL  Final    Comment:    ==================================================================== TOXASSURE COMP DRUG  ANALYSIS,UR ==================================================================== Test                             Result       Flag       Units Drug Present   Gabapentin                     PRESENT   Cyclobenzaprine                PRESENT   Desmethylcyclobenzaprine       PRESENT    Desmethylcyclobenzaprine is an expected metabolite of    cyclobenzaprine.   Acetaminophen                  PRESENT ==================================================================== Test                      Result    Flag   Units      Ref Range   Creatinine              372              mg/dL      >=16>=20 ==================================================================== Declared Medications:  Medication list was not provided. ==================================================================== For clinical consultation, please call [phone]. ====================================================================    Laboratory Chemistry Profile (12 mo)  Renal: 03/06/2018: BUN 13; BUN/Creatinine Ratio 12; Creatinine, Ser 1.05  Lab Results  Component Value Date   GFRAA 77 03/06/2018   GFRNONAA 67 03/06/2018   Hepatic: 03/06/2018: Albumin 4.2 Lab Results  Component Value Date   AST 14 03/06/2018   ALT 12 (L) 05/05/2014   Other: 03/06/2018: 25-Hydroxy, Vitamin D 30; 25-Hydroxy, Vitamin D-2 14; 25-Hydroxy, Vitamin D-3 16; CRP 5; Sed Rate 50; Vitamin B-12 >2000 Note: Above Lab results reviewed.  Imaging  Last 90 days:  Dg Pain Clinic C-arm 1-60 Min No Report  Result Date: 07/23/2018 Fluoro was used, but no Radiologist interpretation will be provided. Please refer to "NOTES" tab for provider progress note.  Last Hospital Admission:  Dg Pain Clinic C-arm 1-60 Min No Report  Result Date: 07/23/2018 Fluoro was used, but no Radiologist interpretation will be provided. Please refer to "NOTES" tab for provider progress note.  Assessment  The primary encounter diagnosis was Chronic pain syndrome.  Diagnoses of Chronic low back pain (Primary area of Pain) (Bilateral) (R>L) w/ sciatica (Right), Chronic lower extremity pain (Secondary Area of Pain) (Right), Chronic hip pain (Tertiary Area of Pain) (Right), and Chronic ankle pain (Fourth Area of Pain) (Right) were also pertinent to this visit.  Plan of Care  I have discontinued Connie Whitney's metFORMIN, Biotin, and oxyCODONE-acetaminophen. I am also having her start on oxyCODONE. Additionally, I am having her maintain her IRON PO, carvedilol, AMITRIPTYLINE HCL PO, linaclotide, potassium chloride, lisinopril, orphenadrine, Magnesium, and gabapentin.  Pharmacotherapy (Medications Ordered): Meds ordered this encounter  Medications  . oxyCODONE (OXY IR/ROXICODONE) 5 MG immediate release tablet    Sig: Take 1 tablet (5 mg total) by mouth every 6 (six) hours as needed for severe pain. Must last 30 days.    Dispense:  120 tablet    Refill:  0    Chronic Pain. (STOP Act - Not applicable). Fill one day early if closed on scheduled refill date. Do not fill until: 08/29/2018. To last until: 09/28/2018.   Orders:  No orders of the defined types were placed in this encounter.  Follow-up plan:   Return  for (VV), E/M (MM).      Interventional management options: Considering: Diagnosticright vs bilateral intra-articular hip injection Diagnostic midlineLESI Possible bilateral lumbar facetRFA   PRN Procedures: Note: The diagnostic bilateral lumbar facet block done on 07/23/2018 would suggest that her pain is not coming from the posterior elements of the lumbosacral region, including the L5-S1 retrolisthesis. (See the 08/06/2018 note for details)     Recent Visits Date Type Provider Dept  08/06/18 Office Visit Milinda Pointer, MD [facility]  07/23/18 Procedure visit Milinda Pointer, MD [facility]  07/03/18 Office Visit Milinda Pointer, MD [facility]  06/03/18 Office Visit Milinda Pointer, MD  [facility]  Showing recent visits within past 90 days and meeting all other requirements   Today's Visits Date Type Provider Dept  08/19/18 Office Visit Milinda Pointer, MD [facility]  Showing today's visits and meeting all other requirements   Future Appointments Date Type Provider Dept  10/07/18 Appointment Milinda Pointer, MD [facility]  Showing future appointments within next 90 days and meeting all other requirements   I discussed the assessment and treatment plan with the patient. The patient was provided an opportunity to ask questions and all were answered. The patient agreed with the plan and demonstrated an understanding of the instructions.  Patient advised to call back or seek an in-person evaluation if the symptoms or condition worsens.  Total duration of non-face-to-face encounter: 13 minutes.  Note by: Gaspar Cola, MD Date: 08/19/2018; Time: 8:20 AM  Note: This dictation was prepared with Dragon dictation. Any transcriptional errors that may result from this process are unintentional.  Disclaimer:  * Given the special circumstances of the COVID-19 pandemic, the federal government has announced that the Office for Civil Rights (OCR) will exercise its enforcement discretion and will not impose penalties on physicians using telehealth in the event of noncompliance with regulatory requirements under the [place] (HIPAA) in connection with the good faith provision of telehealth during the TGPQD-82 national public health emergency. ([place])

## 2018-08-19 ENCOUNTER — Other Ambulatory Visit: Payer: Self-pay

## 2018-08-19 ENCOUNTER — Ambulatory Visit: Payer: Medicaid Other | Attending: Pain Medicine | Admitting: Pain Medicine

## 2018-08-19 DIAGNOSIS — M25571 Pain in right ankle and joints of right foot: Secondary | ICD-10-CM

## 2018-08-19 DIAGNOSIS — G894 Chronic pain syndrome: Secondary | ICD-10-CM | POA: Diagnosis not present

## 2018-08-19 DIAGNOSIS — M79604 Pain in right leg: Secondary | ICD-10-CM | POA: Diagnosis not present

## 2018-08-19 DIAGNOSIS — M5441 Lumbago with sciatica, right side: Secondary | ICD-10-CM | POA: Diagnosis not present

## 2018-08-19 DIAGNOSIS — G8929 Other chronic pain: Secondary | ICD-10-CM

## 2018-08-19 DIAGNOSIS — M25551 Pain in right hip: Secondary | ICD-10-CM

## 2018-08-19 MED ORDER — OXYCODONE HCL 5 MG PO TABS: 5.0000 mg | ORAL_TABLET | Freq: Four times a day (QID) | ORAL | 0 refills | Status: DC | PRN

## 2018-08-19 NOTE — Patient Instructions (Signed)
____________________________________________________________________________________________  Drug Holidays (Slow)  What is a "Drug Holiday"? Drug Holiday: is the name given to the period of time during which a patient stops taking a medication(s) for the purpose of eliminating tolerance to the drug.  Benefits . Improved effectiveness of opioids. . Decreased opioid dose needed to achieve benefits. . Improved pain with lesser dose.  What is tolerance? Tolerance: is the progressive decreased in effectiveness of a drug due to its repetitive use. With repetitive use, the body gets use to the medication and as a consequence, it loses its effectiveness. This is a common problem seen with opioid pain medications. As a result, a larger dose of the drug is needed to achieve the same effect that used to be obtained with a smaller dose.  How long should a "Drug Holiday" last? You should stay off of the pain medicine for at least 14 consecutive days. (2 weeks)  Should I stop the medicine "cold turkey"? No. You should always coordinate with your Pain Specialist so that he/she can provide you with the correct medication dose to make the transition as smoothly as possible.  How do I stop the medicine? Slowly. You will be instructed to decrease the daily amount of pills that you take by one (1) pill every seven (7) days. This is called a "slow downward taper" of your dose. For example: if you normally take four (4) pills per day, you will be asked to drop this dose to three (3) pills per day for seven (7) days, then to two (2) pills per day for seven (7) days, then to one (1) per day for seven (7) days, and at the end of those last seven (7) days, this is when the "Drug Holiday" would start.   Will I have withdrawals? By doing a "slow downward taper" like this one, it is unlikely that you will experience any significant withdrawal symptoms. Typically, what triggers withdrawals is the sudden stop of a high  dose opioid therapy. Withdrawals can usually be avoided by slowly decreasing the dose over a prolonged period of time.  What are withdrawals? Withdrawals: refers to the wide range of symptoms that occur after stopping or dramatically reducing opiate drugs after heavy and prolonged use. Withdrawal symptoms do not occur to patients that use low dose opioids, or those who take the medication sporadically. Contrary to benzodiazepine (example: Valium, Xanax, etc.) or alcohol withdrawals ("Delirium Tremens"), opioid withdrawals are not lethal. Withdrawals are the physical manifestation of the body getting rid of the excess receptors.  Expected Symptoms Early symptoms of withdrawal may include: . Agitation . Anxiety . Muscle aches . Increased tearing . Insomnia . Runny nose . Sweating . Yawning  Late symptoms of withdrawal may include: . Abdominal cramping . Diarrhea . Dilated pupils . Goose bumps . Nausea . Vomiting  Will I experience withdrawals? Due to the slow nature of the taper, it is very unlikely that you will experience any.  What is a slow taper? Taper: refers to the gradual decrease in dose.  ___________________________________________________________________________________________     

## 2018-08-20 ENCOUNTER — Telehealth: Payer: Self-pay | Admitting: Pain Medicine

## 2018-08-20 NOTE — Telephone Encounter (Signed)
Called pharm and informed him that she is being tapered off 7.5 mg and the order for oxy. 5 mg was correct

## 2018-08-20 NOTE — Telephone Encounter (Signed)
Pharmacy called to verify new Scripts sent in. Oxycodone 5mg . And Oxycodone 7.5-325mg  Please call to verify this

## 2018-08-21 ENCOUNTER — Telehealth: Payer: Self-pay | Admitting: Pain Medicine

## 2018-08-21 NOTE — Telephone Encounter (Signed)
Called patient and she states that she always gets her medications filled in the middle of the month. She states that she does not get them early. Patient was very upset that the Rx was not to be filled until 08/29/18.  Called pharm was on hold for 30+ min and spent a lot of time with the pharm trying to figure out her dates and what went wrong. I can not figure this out, other than the dates are off. This is what the  Pharm told me:   Rx filled 05/13/18, 06/18/18, 07/18/18( this prescription was written on 06/03/18).  Can you look into this and let me know if this was fixed so we can called patient,. She is out of her medications and pharm will not fill until 08/29/18. Thanks

## 2018-08-21 NOTE — Telephone Encounter (Signed)
Patient called stating she last filled her meds on 07-18-18. She called pharmacy to get filled again and they are telling her they cannot fill until 08-29-18 per the order on the script.  Please call patient she needs to pick up her meds.

## 2018-08-22 ENCOUNTER — Other Ambulatory Visit: Payer: Self-pay | Admitting: Pain Medicine

## 2018-08-22 DIAGNOSIS — G894 Chronic pain syndrome: Secondary | ICD-10-CM

## 2018-08-22 MED ORDER — OXYCODONE HCL 5 MG PO TABS: 5.0000 mg | ORAL_TABLET | Freq: Four times a day (QID) | ORAL | 0 refills | Status: DC | PRN

## 2018-08-22 NOTE — Progress Notes (Signed)
Hello,    Kaula stated that she'll come back into the clinic and signe the ROI form.                                                                                              Thanks

## 2018-08-24 ENCOUNTER — Ambulatory Visit
Admission: RE | Admit: 2018-08-24 | Discharge: 2018-08-24 | Disposition: A | Discharge status: Home / Self Care | Payer: Medicaid Other | Source: Ambulatory Visit | Attending: Pain Medicine | Admitting: Pain Medicine

## 2018-08-24 ENCOUNTER — Other Ambulatory Visit: Payer: Self-pay

## 2018-08-24 DIAGNOSIS — M5136 Other intervertebral disc degeneration, lumbar region: Secondary | ICD-10-CM | POA: Insufficient documentation

## 2018-08-24 DIAGNOSIS — M549 Dorsalgia, unspecified: Secondary | ICD-10-CM | POA: Insufficient documentation

## 2018-08-24 DIAGNOSIS — G8929 Other chronic pain: Secondary | ICD-10-CM | POA: Diagnosis present

## 2018-08-26 ENCOUNTER — Encounter: Payer: Medicaid Other | Admitting: Pain Medicine

## 2018-09-26 ENCOUNTER — Ambulatory Visit (INDEPENDENT_AMBULATORY_CARE_PROVIDER_SITE_OTHER): Payer: Medicaid Other | Admitting: Cardiology

## 2018-09-26 ENCOUNTER — Encounter: Payer: Self-pay | Admitting: Cardiology

## 2018-09-26 ENCOUNTER — Other Ambulatory Visit: Payer: Self-pay

## 2018-09-26 VITALS — BP 136/100 | HR 63 | Temp 96.8°F | Ht 71.0 in | Wt 210.5 lb

## 2018-09-26 DIAGNOSIS — R011 Cardiac murmur, unspecified: Secondary | ICD-10-CM

## 2018-09-26 DIAGNOSIS — I10 Essential (primary) hypertension: Secondary | ICD-10-CM | POA: Diagnosis not present

## 2018-09-26 DIAGNOSIS — R072 Precordial pain: Secondary | ICD-10-CM | POA: Diagnosis not present

## 2018-09-26 MED ORDER — LISINOPRIL 20 MG PO TABS: 20.0000 mg | ORAL_TABLET | Freq: Two times a day (BID) | ORAL | 1 refills | Status: DC

## 2018-09-26 NOTE — Patient Instructions (Signed)
Medication Instructions:  Your physician has recommended you make the following change in your medication:   1) STOP Bidil  2) INCREASE Lisinopril to 20mg  twice daily. An Rx has been sent to your pharmacy.  If you need a refill on your cardiac medications before your next appointment, please call your pharmacy.   Lab work: None ordered If you have labs (blood work) drawn today and your tests are completely normal, you will receive your results only by: MyChart Message (if you have MyChart) OR . A paper copy in the mail If you have any lab test that is abnormal or we need to change your treatment, we will call you to review the results.  Testing/Procedures: Your physician has requested that you have an echocardiogram. Echocardiography is a painless test that uses sound waves to create images of your heart. It provides your doctor with information about the size and shape of your heart and how well your heart's chambers and valves are working. This procedure takes approximately one hour. There are no restrictions for this procedure.  Your physician has requested that you have a lexiscan myoview. For further information please visit [facility]. Please follow instruction sheet, as given.    Follow-Up: At [facility], you and your health needs are our priority.  As part of our continuing mission to provide you with exceptional heart care, we have created designated Provider Care Teams.  These Care Teams include your primary Cardiologist (physician) and Advanced Practice Providers (APPs -  Physician Assistants and Nurse Practitioners) who all work together to provide you with the care you need, when you need it. You will need a follow up appointment after testing.   You may see [facility], MD or one of the following Advanced Practice Providers on your designated Care Team:   Debbe Odea, NP Nicolasa Ducking, PA-C . Eula Listen, PA-C  Any Other Special Instructions  Will Be Listed Below (If Applicable).  [facility] MYOVIEW  Your caregiver has ordered a Stress Test with nuclear imaging. The purpose of this test is to evaluate the blood supply to your heart muscle. This procedure is referred to as a "Non-Invasive Stress Test." This is because other than having an IV started in your vein, nothing is inserted or "invades" your body. Cardiac stress tests are done to find areas of poor blood flow to the heart by determining the extent of coronary artery disease (CAD). Some patients exercise on a treadmill, which naturally increases the blood flow to your heart, while others who are  unable to walk on a treadmill due to physical limitations have a pharmacologic/chemical stress agent called Lexiscan . This medicine will mimic walking on a treadmill by temporarily increasing your coronary blood flow.   Please note: these test may take anywhere between 2-4 hours to complete  PLEASE REPORT TO [facility]  THE VOLUNTEERS AT THE FIRST DESK WILL DIRECT YOU WHERE TO GO  Date of Procedure:_____________________________________  Arrival Time for Procedure:______________________________  Instructions regarding medication:   OK to take your morning medications with a sip of water   PLEASE NOTIFY THE OFFICE AT LEAST 24 HOURS IN ADVANCE IF YOU ARE UNABLE TO KEEP YOUR APPOINTMENT.  [phone] AND  PLEASE NOTIFY NUCLEAR MEDICINE AT [facility] AT LEAST 24 HOURS IN ADVANCE IF YOU ARE UNABLE TO KEEP YOUR APPOINTMENT. [phone]  How to prepare for your Myoview test:  1. Do not eat or drink after midnight 2. No caffeine for 24 hours prior to test  3. No smoking 24 hours prior to test. 4. Your medication may be taken with water.  If your doctor stopped a medication because of this test, do not take that medication. 5. Ladies, please do not wear dresses.  Skirts or pants are appropriate. Please wear a short sleeve shirt. 6. No perfume, cologne or lotion. 7. Wear  comfortable walking shoes. No heels!

## 2018-09-26 NOTE — Progress Notes (Signed)
Cardiology Office Note:    Date:  09/26/2018   ID:  Connie Whitney, DOB 10/30/1980, MRN [id number]  PCP:  [facility]  Cardiologist:  Debbe Odea, MD  Electrophysiologist:  None   Referring MD: Abram Sander, MD   Chief Complaint  Patient presents with  . New Patient (Initial Visit)    Chest pain; Hypertension    History of Present Illness:    Connie Whitney is a 38 y.o. female with a hx of fibromyalgia, hypertension, arthritis, who presents due to chest pain.  Patient states symptoms typically alcohol on the left side of her chest and describes the pain as chest tightness, sometimes with radiation to her left arm.  Pain is not related with exertion.  She rates the pain as a 7 out of 10.  Symptoms started about 2 years ago while patient was driving from [place] she noticed chest tightness, lasting about 20 to 30 minutes.  She saw her PCP who ordered an ECG but was unrevealing.  She has had intermittent symptoms sayings and last year the pain lasted for almost an hour.  She went to the emergency room and cardiac enzymes were within normal limits.  She has since had intermittent pains, associated with occasional palpitations.  Patient also states having a cardiac murmur and has been told by her regular physician that the murmur is slightly getting worse.  She also states having a difficult to control blood pressure.  She currently takes Coreg 25 mg twice daily,  lisinopril 20 mg daily.  Bidil 20/37.5 milligrams 3 times daily was started but patient states her blood pressures were too low with systolic in the 90s and she became dizzy.  The dose of the Bidil was halved.  But she states her pressures are typically low in the morning and then increased throughout the day.  She states being on HCTZ before but that was also stopped likely due to low potassium levels.    She states having a very stressful life, being a single parent, working full-time, and also full-time Architectural technologist.  And having multiple pain issues including fibromyalgia.  She denies smoking but was exposed to secondhand smoke through her former fianc.  Past Medical History:  Diagnosis Date  . Anemia   . Arthritis   . Fibromyalgia   . Hypertension   . Lupus (HCC)   . Migraines   . Neuropathy   . Vitamin D deficiency     Past Surgical History:  Procedure Laterality Date  . BARIATRIC SURGERY    . CESAREAN SECTION    . DILATION AND CURETTAGE, DIAGNOSTIC / THERAPEUTIC    . ESOPHAGOGASTRODUODENOSCOPY (EGD) WITH PROPOFOL N/A 08/02/2016   Procedure: ESOPHAGOGASTRODUODENOSCOPY (EGD) WITH PROPOFOL;  Surgeon: Scot Jun, MD;  Location: [facility] ENDOSCOPY;  Service: Endoscopy;  Laterality: N/A;  . HYSTEROSCOPY WITH NOVASURE N/A 08/21/2014   Procedure: HYSTEROSCOPY WITH NOVASURE;  Surgeon: Russell Bing, MD;  Location: [facility] ORS;  Service: Gynecology;  Laterality: N/A;  . TUBAL LIGATION      Current Medications: Current Meds  Medication Sig  . AMITRIPTYLINE HCL PO Take 10 mg by mouth 1 day or 1 dose. Takes as needed  . carvedilol (COREG) 12.5 MG tablet Take 25 mg by mouth 2 (two) times daily with a meal.   . gabapentin (NEURONTIN) 300 MG capsule Take 3 capsules (900 mg total) by mouth at bedtime. (Patient taking differently: Take by mouth at bedtime. 2 capsules in a.m. 2 capsules in afternoon 3  capsules at bedtime)  . IRON PO Take 1 tablet by mouth 2 (two) times daily.  . Linaclotide (LINZESS) 290 MCG CAPS capsule Take 290 mcg by mouth daily.  Marland Kitchen lisinopril (ZESTRIL) 20 MG tablet Take 1 tablet (20 mg total) by mouth 2 (two) times daily.  . Magnesium 500 MG CAPS Take 1 capsule (500 mg total) by mouth 2 (two) times daily at 8 am and 10 pm.  . orphenadrine (NORFLEX) 100 MG tablet Take 1 tablet (100 mg total) by mouth 2 (two) times daily as needed for muscle spasms or mild pain.  Marland Kitchen oxyCODONE (OXY IR/ROXICODONE) 5 MG immediate release tablet Take 1 tablet (5 mg total) by mouth every 6 (six) hours  as needed for severe pain. Must last 30 days. (Patient taking differently: Take 7.5 mg by mouth every 6 (six) hours as needed for severe pain. Must last 30 days.)  . potassium chloride (K-DUR,KLOR-CON) 10 MEQ tablet Take 10 mEq by mouth 2 (two) times daily.  . [DISCONTINUED] isosorbide-hydrALAZINE (BIDIL) 20-37.5 MG tablet Take 1 tablet by mouth 3 (three) times daily.  . [DISCONTINUED] lisinopril (ZESTRIL) 5 MG tablet Take 20 mg by mouth daily.      Allergies:   Lac bovis, Milk-related compounds, Pravastatin, and Venlafaxine   Social History   Socioeconomic History  . Marital status: Single    Spouse name: Not on file  . Number of children: Not on file  . Years of education: Not on file  . Highest education level: Not on file  Occupational History  . Not on file  Social Needs  . Financial resource strain: Not on file  . Food insecurity    Worry: Not on file    Inability: Not on file  . Transportation needs    Medical: Not on file    Non-medical: Not on file  Tobacco Use  . Smoking status: Never Smoker  . Smokeless tobacco: Never Used  Substance and Sexual Activity  . Alcohol use: No  . Drug use: No  . Sexual activity: Never  Lifestyle  . Physical activity    Days per week: Not on file    Minutes per session: Not on file  . Stress: Not on file  Relationships  . Social Herbalist on phone: Not on file    Gets together: Not on file    Attends religious service: Not on file    Active member of club or organization: Not on file    Attends meetings of clubs or organizations: Not on file    Relationship status: Not on file  Other Topics Concern  . Not on file  Social History Narrative  . Not on file     Family History: The patient's family history includes Heart murmur in her maternal grandfather and mother; Hyperlipidemia in her father; Hypertension in her father and mother.  ROS:   Please see the history of present illness.     All other systems reviewed  and are negative.  EKGs/Labs/Other Studies Reviewed:    The following studies were reviewed today:   EKG:  EKG is  ordered today.  The ekg ordered today demonstrates normal sinus rhythm, normal ECG.  Recent Labs: 03/06/2018: BUN 13; Creatinine, Ser 1.05; Magnesium 1.5; Potassium 3.2; Sodium 141  Recent Lipid Panel No results found for: CHOL, TRIG, HDL, CHOLHDL, VLDL, LDLCALC, LDLDIRECT  Physical Exam:    VS:  BP (!) 136/100 (BP Location: Right Arm, Patient Position: Sitting, Cuff Size: Normal)  Pulse 63   Temp (!) 96.8 F (36 C)   Ht 5\' 11"  (1.803 m)   Wt 210 lb 8 oz (95.5 kg)   SpO2 98%   BMI 29.36 kg/m     Wt Readings from Last 3 Encounters:  09/26/18 210 lb 8 oz (95.5 kg)  07/23/18 203 lb (92.1 kg)  03/25/18 197 lb (89.4 kg)     GEN:  Well nourished, well developed in no acute distress HEENT: Normal NECK: No JVD; No carotid bruits LYMPHATICS: No lymphadenopathy CARDIAC: RRR, 2/6 systolic murmur at the right upper sternal border.  No rubs or gallops. RESPIRATORY:  Clear to auscultation without rales, wheezing or rhonchi  ABDOMEN: Soft, non-tender, non-distended MUSCULOSKELETAL:  No edema; No deformity  SKIN: Warm and dry NEUROLOGIC:  Alert and oriented x 3 PSYCHIATRIC:  Normal affect   ASSESSMENT:   Symptoms of chest pain are atypical, but she has risk factors of hypertension, secondhand smoke. 1. Precordial pain   2. Essential hypertension   3. Murmur    PLAN:    In order of problems listed above:  1. We will obtain Lexiscan myocardial perfusion imaging 2. Increase lisinopril to 20 mg twice daily.  Continue Coreg 25 mg twice daily.  Stop BiDil.  Patient to check blood pressure over the next week and call office with results. 3. Systolic murmur right upper sternal border.  Will get echocardiogram.  Follow-up after above tests.   Medication Adjustments/Labs and Tests Ordered: Current medicines are reviewed at length with the patient today.  Concerns  regarding medicines are outlined above.  Orders Placed This Encounter  Procedures  . NM Myocar Multi W/Spect W/Wall Motion / EF  . EKG 12-Lead  . ECHOCARDIOGRAM COMPLETE   Meds ordered this encounter  Medications  . lisinopril (ZESTRIL) 20 MG tablet    Sig: Take 1 tablet (20 mg total) by mouth 2 (two) times daily.    Dispense:  180 tablet    Refill:  1    Patient Instructions  Medication Instructions:  Your physician has recommended you make the following change in your medication:   1) STOP Bidil  2) INCREASE Lisinopril to 20mg  twice daily. An Rx has been sent to your pharmacy.  If you need a refill on your cardiac medications before your next appointment, please call your pharmacy.   Lab work: None ordered If you have labs (blood work) drawn today and your tests are completely normal, you will receive your results only by: 03/27/18 MyChart Message (if you have MyChart) OR . A paper copy in the mail If you have any lab test that is abnormal or we need to change your treatment, we will call you to review the results.  Testing/Procedures: Your physician has requested that you have an echocardiogram. Echocardiography is a painless test that uses sound waves to create images of your heart. It provides your doctor with information about the size and shape of your heart and how well your heart's chambers and valves are working. This procedure takes approximately one hour. There are no restrictions for this procedure.  Your physician has requested that you have a lexiscan myoview. For further information please visit . Please follow instruction sheet, as given.    Follow-Up: At [facility], you and your health needs are our priority.  As part of our continuing mission to provide you with exceptional heart care, we have created designated Provider Care Teams.  These Care Teams include your primary Cardiologist (physician) and Advanced Practice  Providers (APPs -   Physician Assistants and Nurse Practitioners) who all work together to provide you with the care you need, when you need it. You will need a follow up appointment after testing.   You may see Debbe OdeaBrian Agbor-Etang, MD or one of the following Advanced Practice Providers on your designated Care Team:   Nicolasa Duckinghristopher Berge, NP Eula Listenyan Dunn, PA-C . Marisue IvanJacquelyn Visser, PA-C  Any Other Special Instructions Will Be Listed Below (If Applicable).  [facility] MYOVIEW  Your caregiver has ordered a Stress Test with nuclear imaging. The purpose of this test is to evaluate the blood supply to your heart muscle. This procedure is referred to as a "Non-Invasive Stress Test." This is because other than having an IV started in your vein, nothing is inserted or "invades" your body. Cardiac stress tests are done to find areas of poor blood flow to the heart by determining the extent of coronary artery disease (CAD). Some patients exercise on a treadmill, which naturally increases the blood flow to your heart, while others who are  unable to walk on a treadmill due to physical limitations have a pharmacologic/chemical stress agent called Lexiscan . This medicine will mimic walking on a treadmill by temporarily increasing your coronary blood flow.   Please note: these test may take anywhere between 2-4 hours to complete  PLE[facility] REPORT TO [facility]  THE VOLUNTEERS AT THE FIRST DESK WILL DIRECT YOU WHERE TO GO  Date of Procedure:_____________________________________  Arrival Time for Procedure:______________________________  Instructions regarding medication:   OK to take your morning medications with a sip of water   PLEASE NOTIFY THE OFFICE AT LEAST 24 HOURS IN ADVANCE IF YOU ARE UNABLE TO KEEP YOUR APPOINTMENT.  [phone] AND  PLEASE NOTIFY NUCLEAR MEDICINE AT [facility] AT LEAST 24 HOURS IN ADVANCE IF YOU ARE UNABLE TO KEEP YOUR APPOINTMENT. [phone]  How to prepare for your Myoview test:  1. Do not eat  or drink after midnight 2. No caffeine for 24 hours prior to test 3. No smoking 24 hours prior to test. 4. Your medication may be taken with water.  If your doctor stopped a medication because of this test, do not take that medication. 5. Ladies, please do not wear dresses.  Skirts or pants are appropriate. Please wear a short sleeve shirt. 6. No perfume, cologne or lotion. 7. Wear comfortable walking shoes. No heels!                Signed, Debbe OdeaBrian Agbor-Etang, MD  09/26/2018 10:13 AM    [facility]

## 2018-10-07 ENCOUNTER — Telehealth: Payer: Self-pay

## 2018-10-07 ENCOUNTER — Ambulatory Visit: Payer: Medicaid Other | Attending: Pain Medicine | Admitting: Pain Medicine

## 2018-10-07 ENCOUNTER — Other Ambulatory Visit: Payer: Self-pay

## 2018-10-07 DIAGNOSIS — G894 Chronic pain syndrome: Secondary | ICD-10-CM

## 2018-10-07 NOTE — Progress Notes (Signed)
Unsuccessful attempt to contact patient for Virtual Visit (Pain Management Telehealth)   Patient provided contact information:  [phone] (home); [phone] (mobile); (Preferred) [phone] No e-mail address on record   Pre-screening:  Our staff was unsuccessful in contacting Ms. Connie Whitney using the above provided information.   I unsuccessfully attempted to make contact with Connie Whitney on 10/07/2018 via telephone. I was unable to complete the virtual encounter due to call going directly to voicemail. I was able to leave a message where I clearly identify myself as Gaspar Cola, MD and I left a message to call us back to reschedule the call.  We may review of the patient's chart I found the patient to have transferred care to the [facility].  Her last oxycodone/APAP 7.5/325 prescription for # 120 tablets was prescribed by Shanon Ace, MD.   Pharmacotherapy Assessment  Analgesic: Oxycodone IR 5 mg,  1 tab PO q 6 hrs (20 mg/day of oxycodone) MME/day:30mg /day.   Monitoring: Pharmacotherapy: No side-effects or adverse reactions reported. [place] PMP: PDMP reviewed during this encounter.       Compliance: No problems identified. Effectiveness: Clinically acceptable. Plan: Refer to "POC".  UDS:  Summary  Date Value Ref Range Status  03/06/2018 FINAL  Final    Comment:    ==================================================================== TOXASSURE COMP DRUG ANALYSIS,UR ==================================================================== Test                             Result       Flag       Units Drug Present   Gabapentin                     PRESENT   Cyclobenzaprine                PRESENT   Desmethylcyclobenzaprine       PRESENT    Desmethylcyclobenzaprine is an expected metabolite of    cyclobenzaprine.   Acetaminophen                  PRESENT ==================================================================== Test                      Result     Flag   Units      Ref Range   Creatinine              372              mg/dL      >=20 ==================================================================== Declared Medications:  Medication list was not provided. ==================================================================== For clinical consultation, please call [phone]. ====================================================================    Follow-up plan:   No follow-ups on file.      Interventional management options: Considering: Diagnosticright vs bilateral intra-articular hip injection Diagnostic midlineLESI Possible bilateral lumbar facetRFA   PRN Procedures: Note: The diagnostic bilateral lumbar facet block done on 07/23/2018 would suggest that her pain is not coming from the posterior elements of the lumbosacral region, including the L5-S1 retrolisthesis. (See the 08/06/2018 note for details)      Recent Visits Date Type Provider Dept  08/19/18 Office Visit Milinda Pointer, MD [facility]  08/06/18 Office Visit Milinda Pointer, MD [facility]  07/23/18 Procedure visit Milinda Pointer, MD [facility]  Showing recent visits within past 90 days and meeting all other requirements   Today's Visits Date Type Provider Dept  10/07/18 Office Visit Milinda Pointer, MD [facility]  Showing  today's visits and meeting all other requirements   Future Appointments No visits were found meeting these conditions.  Showing future appointments within next 90 days and meeting all other requirements    Note by: Oswaldo Done, MD Date: 10/07/2018; Time: 10:23 AM

## 2018-10-07 NOTE — Telephone Encounter (Signed)
Left message to call office to go over her pre appointment questions.

## 2018-10-10 ENCOUNTER — Ambulatory Visit
Admission: RE | Admit: 2018-10-10 | Discharge: 2018-10-10 | Disposition: A | Discharge status: Home / Self Care | Payer: Medicaid Other | Source: Ambulatory Visit | Attending: Cardiology | Admitting: Cardiology

## 2018-10-10 ENCOUNTER — Other Ambulatory Visit: Payer: Self-pay

## 2018-10-10 DIAGNOSIS — R072 Precordial pain: Secondary | ICD-10-CM | POA: Diagnosis present

## 2018-10-10 LAB — NM MYOCAR MULTI W/SPECT W/WALL MOTION / EF
LV dias vol: 97 mL (ref 46–106)
LV sys vol: 34 mL
Peak HR: 85 {beats}/min
Percent HR: 47 %
Rest HR: 63 {beats}/min
SDS: 0
SRS: 1
SSS: 1
TID: 1

## 2018-10-10 MED ORDER — TECHNETIUM TC 99M TETROFOSMIN IV KIT
33.1700 | PACK | Freq: Once | INTRAVENOUS | Status: AC | PRN
  Administered 2018-10-10: 10:00:00 33.17 via INTRAVENOUS

## 2018-10-10 MED ORDER — TECHNETIUM TC 99M TETROFOSMIN IV KIT
10.0000 | PACK | Freq: Once | INTRAVENOUS | Status: AC | PRN
  Administered 2018-10-10: 09:00:00 10.436 via INTRAVENOUS

## 2018-10-10 MED ORDER — REGADENOSON 0.4 MG/5ML IV SOLN
0.4000 mg | Freq: Once | INTRAVENOUS | Status: AC
  Administered 2018-10-10: 0.4 mg via INTRAVENOUS

## 2018-10-11 ENCOUNTER — Telehealth: Payer: Self-pay

## 2018-10-11 NOTE — Telephone Encounter (Signed)
Patient returning call. She will be in class all day but please leave a detailed vm.

## 2018-10-11 NOTE — Telephone Encounter (Signed)
Called to give the patient stress test results. [email or web address]. 

## 2018-10-11 NOTE — Telephone Encounter (Signed)
Patient made aware of stress test results with verbalized understanding. 

## 2018-10-11 NOTE — Telephone Encounter (Signed)
-----   Message from Kate Sable, MD sent at 10/11/2018  8:17 AM EDT ----- Normal test.

## 2018-10-31 ENCOUNTER — Other Ambulatory Visit: Payer: Self-pay

## 2018-10-31 ENCOUNTER — Ambulatory Visit (INDEPENDENT_AMBULATORY_CARE_PROVIDER_SITE_OTHER): Payer: Medicaid Other

## 2018-10-31 DIAGNOSIS — R011 Cardiac murmur, unspecified: Secondary | ICD-10-CM | POA: Diagnosis not present

## 2018-11-04 ENCOUNTER — Ambulatory Visit (INDEPENDENT_AMBULATORY_CARE_PROVIDER_SITE_OTHER): Payer: Medicaid Other | Admitting: Cardiology

## 2018-11-04 ENCOUNTER — Encounter: Payer: Self-pay | Admitting: Cardiology

## 2018-11-04 ENCOUNTER — Other Ambulatory Visit: Payer: Self-pay

## 2018-11-04 VITALS — BP 164/110 | HR 62 | Temp 97.2°F | Ht 71.0 in | Wt 212.5 lb

## 2018-11-04 DIAGNOSIS — I10 Essential (primary) hypertension: Secondary | ICD-10-CM

## 2018-11-04 MED ORDER — SPIRONOLACTONE 25 MG PO TABS: 25.0000 mg | ORAL_TABLET | Freq: Every day | ORAL | 5 refills | Status: DC

## 2018-11-04 NOTE — Patient Instructions (Signed)
Medication Instructions:  Your physician has recommended you make the following change in your medication:   START Spironolactone 25mg  daily. An Rx has been sent to your pharmacy.  *If you need a refill on your cardiac medications before your next appointment, please call your pharmacy*  Lab Work: Your physician recommends that you return for lab work in: 3 days.  Please have a Bmet and Serum Metanepherine drawn at [place] [facility]. You do not need an appointment. Their hours are Mon-Fri 5am-5pm.  If you have labs (blood work) drawn today and your tests are completely normal, you will receive your results only by: Marland Kitchen MyChart Message (if you have MyChart) OR . A paper copy in the mail If you have any lab test that is abnormal or we need to change your treatment, we will call you to review the results.  Testing/Procedures: None ordered  Follow-Up: At [facility], you and your health needs are our priority.  As part of our continuing mission to provide you with exceptional heart care, we have created designated Provider Care Teams.  These Care Teams include your primary Cardiologist (physician) and Advanced Practice Providers (APPs -  Physician Assistants and Nurse Practitioners) who all work together to provide you with the care you need, when you need it.  Your next appointment:   2 weeks  The format for your next appointment:   In Person  Provider:   Kate Sable, MD  Other Instructions Your physician has requested that you regularly monitor and record your blood pressure readings at home. Please use the same machine at the same time of day to check your readings and record them to bring to your follow-up visit.

## 2018-11-04 NOTE — Progress Notes (Signed)
Cardiology Office Note:    Date:  11/04/2018   ID:  Connie Whitney, DOB Oct 12, 1980, MRN [id number]  PCP:  [facility]  Cardiologist:  Debbe Odea, MD  Electrophysiologist:  None   Referring MD: [facility]   Chief Complaint  Patient presents with  . other    Follow up Echo & Myoview. Meds reviewed by the pt. verbally. Pt. c/o palpitations, chest pain and HTN.     History of Present Illness:    Connie Whitney is a 38 y.o. female with a hx of fibromyalgia, hypertension, arthritis, who presents for follow-up.  She was originally seen for left-sided chest pain.    On her prior visit, patient states symptoms typically alcohol on the left side of her chest and describes the pain as chest tightness, sometimes with radiation to her left arm.  Due to her risk factors and echocardiogram and Lexiscan myocardial perfusion test was ordered.  She presents for results.    She currently takes Coreg 25 mg twice daily,  lisinopril 20 mg daily.  BiDil was stopped after patient states her blood pressure bottomed out after taking BiDil.  Systolic blood pressure typically in the 90s and slowly go up throughout the day.  She states being on HCTZ before but that was also stopped likely due to low potassium levels.  She notes occasional flushing and tachycardia which has been going on for the past year, occurring about 2 times a week.  Past Medical History:  Diagnosis Date  . Anemia   . Arthritis   . Fibromyalgia   . Hypertension   . Lupus (HCC)   . Migraines   . Neuropathy   . Vitamin D deficiency     Past Surgical History:  Procedure Laterality Date  . BARIATRIC SURGERY    . CESAREAN SECTION    . DILATION AND CURETTAGE, DIAGNOSTIC / THERAPEUTIC    . ESOPHAGOGASTRODUODENOSCOPY (EGD) WITH PROPOFOL N/A 08/02/2016   Procedure: ESOPHAGOGASTRODUODENOSCOPY (EGD) WITH PROPOFOL;  Surgeon: Scot Jun, MD;  Location: [facility];  Service: Endoscopy;   Laterality: N/A;  . HYSTEROSCOPY WITH NOVASURE N/A 08/21/2014   Procedure: HYSTEROSCOPY WITH NOVASURE;  Surgeon: Mojave Bing, MD;  Location: [facility] ORS;  Service: Gynecology;  Laterality: N/A;  . TUBAL LIGATION      Current Medications: Current Meds  Medication Sig  . AMITRIPTYLINE HCL PO Take 10 mg by mouth 1 day or 1 dose. Takes as needed  . carvedilol (COREG) 12.5 MG tablet Take 25 mg by mouth 2 (two) times daily with a meal.   . gabapentin (NEURONTIN) 300 MG capsule Take 3 capsules (900 mg total) by mouth at bedtime. (Patient taking differently: Take by mouth at bedtime. 2 capsules in a.m. 2 capsules in afternoon 3 capsules at bedtime)  . IRON PO Take 1 tablet by mouth 2 (two) times daily.  . Linaclotide (LINZESS) 290 MCG CAPS capsule Take 290 mcg by mouth daily.  Marland Kitchen lisinopril (ZESTRIL) 20 MG tablet Take 1 tablet (20 mg total) by mouth 2 (two) times daily.  . Magnesium 500 MG CAPS Take 1 capsule (500 mg total) by mouth 2 (two) times daily at 8 am and 10 pm.  . orphenadrine (NORFLEX) 100 MG tablet Take 1 tablet (100 mg total) by mouth 2 (two) times daily as needed for muscle spasms or mild pain.  Marland Kitchen oxyCODONE (OXY IR/ROXICODONE) 5 MG immediate release tablet Take 1 tablet (5 mg total) by mouth every 6 (six) hours as needed for severe  pain. Must last 30 days. (Patient taking differently: Take 7.5 mg by mouth every 6 (six) hours as needed for severe pain. Must last 30 days.)  . potassium chloride (K-DUR,KLOR-CON) 10 MEQ tablet Take 10 mEq by mouth 2 (two) times daily.     Allergies:   Lac bovis, Milk-related compounds, Pravastatin, and Venlafaxine   Social History   Socioeconomic History  . Marital status: Single    Spouse name: Not on file  . Number of children: Not on file  . Years of education: Not on file  . Highest education level: Not on file  Occupational History  . Not on file  Social Needs  . Financial resource strain: Not on file  . Food insecurity    Worry: Not on  file    Inability: Not on file  . Transportation needs    Medical: Not on file    Non-medical: Not on file  Tobacco Use  . Smoking status: Never Smoker  . Smokeless tobacco: Never Used  Substance and Sexual Activity  . Alcohol use: No  . Drug use: No  . Sexual activity: Never  Lifestyle  . Physical activity    Days per week: Not on file    Minutes per session: Not on file  . Stress: Not on file  Relationships  . Social Musician on phone: Not on file    Gets together: Not on file    Attends religious service: Not on file    Active member of club or organization: Not on file    Attends meetings of clubs or organizations: Not on file    Relationship status: Not on file  Other Topics Concern  . Not on file  Social History Narrative  . Not on file     Family History: The patient's family history includes Heart murmur in her maternal grandfather and mother; Hyperlipidemia in her father; Hypertension in her father and mother.  ROS:   Please see the history of present illness.     All other systems reviewed and are negative.  EKGs/Labs/Other Studies Reviewed:    The following studies were reviewed today:  Echocardiogram dated 10/31/2018  1. Left ventricular ejection fraction, by visual estimation, is 60 to 65%. The left ventricle has normal function. There is no left ventricular hypertrophy.  2. Left ventricular diastolic parameters are consistent with Grade I diastolic dysfunction (impaired relaxation).  3. Global right ventricle has normal systolic function.The right ventricular size is normal. No increase in right ventricular wall thickness.  4. Left atrial size was normal.  5. Mild mitral valve regurgitation.  6. Tricuspid valve regurgitation is mild.  7. The aortic valve was not well visualized, Unable to exclude bicuspid aortic valve.  8. Mildly elevated pulmonary artery systolic pressure.  Pharmacological MPI study 10/10/2018 Pharmacological myocardial  perfusion imaging study with no significant  ischemia Normal wall motion, EF estimated at 67% No EKG changes concerning for ischemia at peak stress or in recovery. Low risk scan EKG:  EKG is  ordered today.  The ekg ordered today demonstrates normal sinus rhythm, normal ECG.  Recent Labs: 03/06/2018: BUN 13; Creatinine, Ser 1.05; Magnesium 1.5; Potassium 3.2; Sodium 141  Recent Lipid Panel No results found for: CHOL, TRIG, HDL, CHOLHDL, VLDL, LDLCALC, LDLDIRECT  Physical Exam:    VS:  BP (!) 164/110 (BP Location: Left Arm, Patient Position: Sitting, Cuff Size: Normal)   Pulse 62   Temp (!) 97.2 F (36.2 C)   Ht 5'  11" (1.803 m)   Wt 212 lb 8 oz (96.4 kg)   BMI 29.64 kg/m     Wt Readings from Last 3 Encounters:  11/04/18 212 lb 8 oz (96.4 kg)  09/26/18 210 lb 8 oz (95.5 kg)  07/23/18 203 lb (92.1 kg)     GEN:  Well nourished, well developed in no acute distress HEENT: Normal NECK: No JVD; No carotid bruits LYMPHATICS: No lymphadenopathy CARDIAC: RRR, 1/6 systolic murmur at the right upper sternal border.  No rubs or gallops. RESPIRATORY:  Clear to auscultation without rales, wheezing or rhonchi  ABDOMEN: Soft, non-tender, non-distended MUSCULOSKELETAL:  No edema; No deformity  SKIN: Warm and dry NEUROLOGIC:  Alert and oriented x 3 PSYCHIATRIC:  Normal affect   ASSESSMENT:   Echocardiogram shows normal ejection fraction, mild MR. Lexiscan myocardial perfusion imaging was without evidence of ischemia.  Her blood pressure is still elevated.  Secondary causes for hypertension need to be evaluated.  She has elevated blood pressure and a history of low potassium.  This makes an aldosterone producing tumor possible in the differential especially as adding HCTZ in the past led to severely reduced hypokalemia.  She also states having episodic flushing and tachycardia, pheochromocytoma also in the differential.  1. HTN PLAN:    We will add spironolactone 25 mg daily to her blood  pressure regimen.  Check serum metanephrines and BMP.  If blood pressure normalized on Aldactone and or metanephrines are abnormal we will plan to do an abdominal CT to rule out an adrenal tumor.  Continue Coreg and lisinopril as currently prescribed.  Follow-up with me in about 2 weeks  Total encounter time more than 40 minutes  Greater than 50% was spent in counseling and coordination of care with the patient  This note was generated in part or whole with voice recognition software. Voice recognition is usually quite accurate but there are transcription errors that can and very often do occur. I apologize for any typographical errors that were not detected and corrected.  Medication Adjustments/Labs and Tests Ordered: Current medicines are reviewed at length with the patient today.  Concerns regarding medicines are outlined above.  Orders Placed This Encounter  Procedures  . Basic metabolic panel  . Metanephrines, plasma  . EKG 12-Lead   Meds ordered this encounter  Medications  . spironolactone (ALDACTONE) 25 MG tablet    Sig: Take 1 tablet (25 mg total) by mouth daily.    Dispense:  30 tablet    Refill:  5    Patient Instructions  Medication Instructions:  Your physician has recommended you make the following change in your medication:   START Spironolactone 25mg  daily. An Rx has been sent to your pharmacy.  *If you need a refill on your cardiac medications before your next appointment, please call your pharmacy*  Lab Work: Your physician recommends that you return for lab work in: 3 days.  Please have a Bmet and Serum Metanepherine drawn at [facility]. You do not need an appointment. Their hours are Mon-Fri 5am-5pm.  If you have labs (blood work) drawn today and your tests are completely normal, you will receive your results only by: Marland Kitchen. MyChart Message (if you have MyChart) OR . A paper copy in the mail If you have any lab test that is abnormal or we need to change  your treatment, we will call you to review the results.  Testing/Procedures: None ordered  Follow-Up: At [facility], you and your health needs are  our priority.  As part of our continuing mission to provide you with exceptional heart care, we have created designated Provider Care Teams.  These Care Teams include your primary Cardiologist (physician) and Advanced Practice Providers (APPs -  Physician Assistants and Nurse Practitioners) who all work together to provide you with the care you need, when you need it.  Your next appointment:   2 weeks  The format for your next appointment:   In Person  Provider:   Kate Sable, MD  Other Instructions Your physician has requested that you regularly monitor and record your blood pressure readings at home. Please use the same machine at the same time of day to check your readings and record them to bring to your follow-up visit.       Signed, Kate Sable, MD  11/04/2018 11:05 AM    [place]

## 2018-11-05 ENCOUNTER — Telehealth: Payer: Self-pay | Admitting: Cardiology

## 2018-11-05 NOTE — Telephone Encounter (Signed)
Patient has a hx of hypokalemia. Message fwd to Dr. Garen Lah for ok to proceed with starting Spirolactone with a bmet to be drawn 3 day after.

## 2018-11-05 NOTE — Telephone Encounter (Signed)
Pt c/o medication issue:  1. Name of Medication: Spironolactone   2. How are you currently taking this medication (dosage and times per day)? 25 mg po q d   3. Are you having a reaction (difficulty breathing--STAT)? no   4. What is your medication issue?    Per pharmacy patient has hx taking Potassium and there is a drug interaction flag with spironolactone    last filled potassium  in 2018 unknown if she is getting somewhere else now   Please call

## 2018-11-05 NOTE — Telephone Encounter (Signed)
Returned the call to the patient's pharmacy and spoke with the pharmacist Gerome Sam made aware of Dr. Thereasa Solo response and recommendation. The pharmacy will go a head a fill the spironolactone prescription.

## 2018-11-08 ENCOUNTER — Other Ambulatory Visit
Admission: RE | Admit: 2018-11-08 | Discharge: 2018-11-08 | Disposition: A | Discharge status: Home / Self Care | Payer: Medicaid Other | Attending: Cardiology | Admitting: Cardiology

## 2018-11-08 ENCOUNTER — Other Ambulatory Visit: Payer: Self-pay

## 2018-11-08 DIAGNOSIS — I10 Essential (primary) hypertension: Secondary | ICD-10-CM

## 2018-11-11 ENCOUNTER — Other Ambulatory Visit: Payer: Self-pay

## 2018-11-11 ENCOUNTER — Other Ambulatory Visit
Admission: RE | Admit: 2018-11-11 | Discharge: 2018-11-11 | Disposition: A | Discharge status: Home / Self Care | Payer: Medicaid Other | Attending: Cardiology | Admitting: Cardiology

## 2018-11-11 DIAGNOSIS — I10 Essential (primary) hypertension: Secondary | ICD-10-CM | POA: Diagnosis present

## 2018-11-11 LAB — BASIC METABOLIC PANEL
Anion gap: 7 (ref 5–15)
BUN: 15 mg/dL (ref 6–20)
CO2: 27 mmol/L (ref 22–32)
Calcium: 8.7 mg/dL — ABNORMAL LOW (ref 8.9–10.3)
Chloride: 104 mmol/L (ref 98–111)
Creatinine, Ser: 0.96 mg/dL (ref 0.44–1.00)
GFR calc Af Amer: >60 mL/min (ref >60)
GFR calc non Af Amer: >60 mL/min (ref >60)
Glucose, Bld: 149 mg/dL — ABNORMAL HIGH (ref 70–99)
Potassium: 3.3 mmol/L — ABNORMAL LOW (ref 3.5–5.1)
Sodium: 138 mmol/L (ref 135–145)

## 2018-11-19 LAB — METANEPHRINES, PLASMA
Metanephrine, Free: 19 pg/mL (ref 0.0–88.0)
Normetanephrine, Free: 57.5 pg/mL (ref 0.0–125.8)

## 2018-11-21 ENCOUNTER — Ambulatory Visit (INDEPENDENT_AMBULATORY_CARE_PROVIDER_SITE_OTHER): Payer: Medicaid Other | Admitting: Cardiology

## 2018-11-21 ENCOUNTER — Other Ambulatory Visit: Payer: Self-pay

## 2018-11-21 ENCOUNTER — Encounter: Payer: Self-pay | Admitting: Cardiology

## 2018-11-21 VITALS — BP 160/100 | HR 78 | Ht 71.0 in | Wt 216.8 lb

## 2018-11-21 DIAGNOSIS — E278 Other specified disorders of adrenal gland: Secondary | ICD-10-CM

## 2018-11-21 DIAGNOSIS — I10 Essential (primary) hypertension: Secondary | ICD-10-CM | POA: Diagnosis not present

## 2018-11-21 MED ORDER — HYDRALAZINE HCL 25 MG PO TABS: 25.0000 mg | ORAL_TABLET | Freq: Three times a day (TID) | ORAL | 3 refills | Status: DC

## 2018-11-21 NOTE — Patient Instructions (Addendum)
Medication Instructions:  Your physician has recommended you make the following change in your medication:  1. STOP Spironolactone 2. START Hydralazine 25 mg three times a day  *If you need a refill on your cardiac medications before your next appointment, please call your pharmacy*  Lab Work: Aldosterone + renin activity w/ ratio also with plasma renin. Take lab slips and go to [facility] to have done in roughly 6 weeks which would be 01/02/2019. Go first thing in the morning if you can.   If you have labs (blood work) drawn today and your tests are completely normal, you will receive your results only by: Marland Kitchen MyChart Message (if you have MyChart) OR . A paper copy in the mail If you have any lab test that is abnormal or we need to change your treatment, we will call you to review the results.  Testing/Procedures: CT scan of the abdomen with and without contrast This will evaluate the adrenal and renal arteries Nothing to eat or drink after midnight before having this done. Call [phone] and select option 3 if you should need to reschedule.  Thursday December 3rd at 08:00 AM here at [facility].  PICK UP CONTRAST  Your physician has requested that you have a renal artery duplex. During this test, an ultrasound is used to evaluate blood flow to the kidneys. Allow one hour for this exam. Do not eat after midnight the day before and avoid carbonated beverages. Take your medications as you usually do.     Follow-Up: At [facility], you and your health needs are our priority.  As part of our continuing mission to provide you with exceptional heart care, we have created designated Provider Care Teams.  These Care Teams include your primary Cardiologist (physician) and Advanced Practice Providers (APPs -  Physician Assistants and Nurse Practitioners) who all work together to provide you with the care you need, when you need it.  Your next appointment:   2  month(s) if your testing is done before then you can call back and schedule earlier appointment so he can review results with you in person.   The format for your next appointment:   In Person  Provider:   Debbe Odea, MD    How to Take Your Blood Pressure You can take your blood pressure at home with a machine. You may need to check your blood pressure at home:  To check if you have high blood pressure (hypertension).  To check your blood pressure over time.  To make sure your blood pressure medicine is working. Supplies needed: You will need a blood pressure machine, or monitor. You can buy one at a drugstore or online. When choosing one:  Choose one with an arm cuff.  Choose one that wraps around your upper arm. Only one finger should fit between your arm and the cuff.  Do not choose one that measures your blood pressure from your wrist or finger. Your doctor can suggest a monitor. How to prepare Avoid these things for 30 minutes before checking your blood pressure:  Drinking caffeine.  Drinking alcohol.  Eating.  Smoking.  Exercising. Five minutes before checking your blood pressure:  Pee.  Sit in a dining chair. Avoid sitting in a soft couch or armchair.  Be quiet. Do not talk. How to take your blood pressure Follow the instructions that came with your machine. If you have a digital blood pressure monitor, these may be the instructions: 1. Sit up  straight. 2. Place your feet on the floor. Do not cross your ankles or legs. 3. Rest your left arm at the level of your heart. You may rest it on a table, desk, or chair. 4. Pull up your shirt sleeve. 5. Wrap the blood pressure cuff around the upper part of your left arm. The cuff should be 1 inch (2.5 cm) above your elbow. It is best to wrap the cuff around bare skin. 6. Fit the cuff snugly around your arm. You should be able to place only one finger between the cuff and your arm. 7. Put the cord inside the  groove of your elbow. 8. Press the power button. 9. Sit quietly while the cuff fills with air and loses air. 10. Write down the numbers on the screen. 11. Wait 2-3 minutes and then repeat steps 1-10. What do the numbers mean? Two numbers make up your blood pressure. The first number is called systolic pressure. The second is called diastolic pressure. An example of a blood pressure reading is "120 over 80" (or 120/80). If you are an adult and do not have a medical condition, use this guide to find out if your blood pressure is normal: Normal  First number: below 120.  Second number: below 80. Elevated  First number: 120-129.  Second number: below 80. Hypertension stage 1  First number: 130-139.  Second number: 80-89. Hypertension stage 2  First number: 140 or above.  Second number: 3 or above. Your blood pressure is above normal even if only the top or bottom number is above normal. Follow these instructions at home:  Check your blood pressure as often as your doctor tells you to.  Take your monitor to your next doctor's appointment. Your doctor will: ? Make sure you are using it correctly. ? Make sure it is working right.  Make sure you understand what your blood pressure numbers should be.  Tell your doctor if your medicines are causing side effects. Contact a doctor if:  Your blood pressure keeps being high. Get help right away if:  Your first blood pressure number is higher than 180.  Your second blood pressure number is higher than 120. This information is not intended to replace advice given to you by your health care provider. Make sure you discuss any questions you have with your health care provider. Document Released: 12/02/2007 Document Revised: 12/01/2016 Document Reviewed: 05/28/2015 Elsevier Patient Education  2020 [employer].

## 2018-11-21 NOTE — Progress Notes (Signed)
Cardiology Office Note:    Date:  11/21/2018   ID:  Connie Peteriffany Bun, DOB Dec 27, 1980, MRN [id number]  PCP:  [facility]  Cardiologist:  Debbe OdeaBrian Agbor-Etang, MD  Electrophysiologist:  None   Referring MD: [facility], [facility]   Chief Complaint  Patient presents with  . other    2 wk f/u c/o elevated BP and headaches. Meds reviewed verbally with pt.    History of Present Illness:    Connie Whitney is a 38 y.o. female with a hx of fibromyalgia, hypertension, arthritis, who presents for follow-up.  She was originally seen for left-sided chest pain.  Echocardiogram and stress test were normal.  She had poorly controlled hypertension.  She noted having occasional flushing and tachycardia, serum metanephrines were within normal limits.  Her potassium was on the low, last value 3.3.  She was started on Aldactone 25 mg daily.  She currently takes Coreg 25 mg twice daily,  lisinopril 20 mg daily.  BiDil was stopped after patient states her blood pressure bottomed out after taking BiDil.  Systolic blood pressure typically in the 90s and slowly go up throughout the day.  She states being on HCTZ before but that was also stopped likely due to low potassium levels.  About 6 days ago, patient states having an episode of flushing dizziness, palpitations.  Her systolic blood pressure was in the 70s, then later on after resolution of symptoms blood pressure was in the 160s.  Starting Aldactone 25 mg daily has not improved her symptoms.  Past Medical History:  Diagnosis Date  . Anemia   . Arthritis   . Fibromyalgia   . Hypertension   . Lupus (HCC)   . Migraines   . Neuropathy   . Vitamin D deficiency     Past Surgical History:  Procedure Laterality Date  . BARIATRIC SURGERY    . CESAREAN SECTION    . DILATION AND CURETTAGE, DIAGNOSTIC / THERAPEUTIC    . ESOPHAGOGASTRODUODENOSCOPY (EGD) WITH PROPOFOL N/A 08/02/2016   Procedure: ESOPHAGOGASTRODUODENOSCOPY (EGD) WITH  PROPOFOL;  Surgeon: Scot JunElliott, Robert T, MD;  [facility];  Service: Endoscopy;  Laterality: N/A;  . HYSTEROSCOPY WITH NOVASURE N/A 08/21/2014   Procedure: HYSTEROSCOPY WITH NOVASURE;  Surgeon: Ronceverte Bingharlie Pickens, MD;  Location: [facility] ORS;  Service: Gynecology;  Laterality: N/A;  . TUBAL LIGATION      Current Medications: Current Meds  Medication Sig  . AMITRIPTYLINE HCL PO Take 10 mg by mouth 1 day or 1 dose. Takes as needed  . carvedilol (COREG) 12.5 MG tablet Take 25 mg by mouth 2 (two) times daily with a meal.   . gabapentin (NEURONTIN) 300 MG capsule Take 3 capsules (900 mg total) by mouth at bedtime. (Patient taking differently: Take by mouth at bedtime. 2 capsules in a.m. 2 capsules in afternoon 3 capsules at bedtime)  . IRON PO Take 1 tablet by mouth 2 (two) times daily.  . Linaclotide (LINZESS) 290 MCG CAPS capsule Take 290 mcg by mouth daily.  Marland Kitchen. lisinopril (ZESTRIL) 20 MG tablet Take 1 tablet (20 mg total) by mouth 2 (two) times daily.  . Magnesium 500 MG CAPS Take 1 capsule (500 mg total) by mouth 2 (two) times daily at 8 am and 10 pm.  . orphenadrine (NORFLEX) 100 MG tablet Take 1 tablet (100 mg total) by mouth 2 (two) times daily as needed for muscle spasms or mild pain.  Marland Kitchen. oxyCODONE (OXY IR/ROXICODONE) 5 MG immediate release tablet Take 1 tablet (5 mg total)  by mouth every 6 (six) hours as needed for severe pain. Must last 30 days. (Patient taking differently: Take 7.5 mg by mouth every 6 (six) hours as needed for severe pain. Must last 30 days.)  . potassium chloride (K-DUR,KLOR-CON) 10 MEQ tablet Take 10 mEq by mouth 2 (two) times daily.  . [DISCONTINUED] spironolactone (ALDACTONE) 25 MG tablet Take 1 tablet (25 mg total) by mouth daily.     Allergies:   Lac bovis, Milk-related compounds, Pravastatin, and Venlafaxine   Social History   Socioeconomic History  . Marital status: Single    Spouse name: Not on file  . Number of children: Not on file  . Years of  education: Not on file  . Highest education level: Not on file  Occupational History  . Not on file  Social Needs  . Financial resource strain: Not on file  . Food insecurity    Worry: Not on file    Inability: Not on file  . Transportation needs    Medical: Not on file    Non-medical: Not on file  Tobacco Use  . Smoking status: Never Smoker  . Smokeless tobacco: Never Used  Substance and Sexual Activity  . Alcohol use: No  . Drug use: No  . Sexual activity: Never  Lifestyle  . Physical activity    Days per week: Not on file    Minutes per session: Not on file  . Stress: Not on file  Relationships  . Social Musician on phone: Not on file    Gets together: Not on file    Attends religious service: Not on file    Active member of club or organization: Not on file    Attends meetings of clubs or organizations: Not on file    Relationship status: Not on file  Other Topics Concern  . Not on file  Social History Narrative  . Not on file     Family History: The patient's family history includes Heart murmur in her maternal grandfather and mother; Hyperlipidemia in her father; Hypertension in her father and mother.  ROS:   Please see the history of present illness.     All other systems reviewed and are negative.  EKGs/Labs/Other Studies Reviewed:    The following studies were reviewed today:  Echocardiogram dated 10/31/2018  1. Left ventricular ejection fraction, by visual estimation, is 60 to 65%. The left ventricle has normal function. There is no left ventricular hypertrophy.  2. Left ventricular diastolic parameters are consistent with Grade I diastolic dysfunction (impaired relaxation).  3. Global right ventricle has normal systolic function.The right ventricular size is normal. No increase in right ventricular wall thickness.  4. Left atrial size was normal.  5. Mild mitral valve regurgitation.  6. Tricuspid valve regurgitation is mild.  7. The  aortic valve was not well visualized, Unable to exclude bicuspid aortic valve.  8. Mildly elevated pulmonary artery systolic pressure.  Pharmacological MPI study 10/10/2018 Pharmacological myocardial perfusion imaging study with no significant  ischemia Normal wall motion, EF estimated at 67% No EKG changes concerning for ischemia at peak stress or in recovery. Low risk scan  EKG:  EKG is  ordered today.  The ekg ordered today demonstrates normal sinus rhythm, normal ECG.  Recent Labs: 03/06/2018: Magnesium 1.5 11/11/2018: BUN 15; Creatinine, Ser 0.96; Potassium 3.3; Sodium 138  Recent Lipid Panel No results found for: CHOL, TRIG, HDL, CHOLHDL, VLDL, LDLCALC, LDLDIRECT  Physical Exam:    VS:  BP (!) 160/100 (BP Location: Left Arm, Patient Position: Sitting, Cuff Size: Normal)   Pulse 78   Ht  (1.803 m)   Wt 216 lb 12 oz (98.3 kg)   SpO2 99%   BMI 30.23 kg/m     Wt Readings from Last 3 Encounters:  11/21/18 216 lb 12 oz (98.3 kg)  11/04/18 212 lb 8 oz (96.4 kg)  09/26/18 210 lb 8 oz (95.5 kg)     GEN:  Well nourished, well developed in no acute distress HEENT: Normal NECK: No JVD; No carotid bruits LYMPHATICS: No lymphadenopathy CARDIAC: RRR, 1/6 systolic murmur at the right upper sternal border.  No rubs or gallops. RESPIRATORY:  Clear to auscultation without rales, wheezing or rhonchi  ABDOMEN: Soft, non-tender, non-distended MUSCULOSKELETAL:  No edema; No deformity  SKIN: Warm and dry NEUROLOGIC:  Alert and oriented x 3 PSYCHIATRIC:  Normal affect   ASSESSMENT:   Serum metanephrines were within normal limits.  Her wild swings in blood pressure changes suggest few but serum levels have been unrevealing.  Blood pressure is still elevated despite starting spironolactone.  Her last BMP shows hypokalemia with potassium of 3.3.  1. HTN PLAN:    We'll stop spironolactone.  Check plasma renin and plasma aldosterone in 4 weeks.  Get abdominal CT with and without contrast  to evaluate for an adrenal mass and renal artery ultrasound to evaluate for renal artery stenosis.  Start hydralazine 25 mg 3 times daily.  Follow-up after above tests.  Total encounter time more than 40 minutes  Greater than 50% was spent in counseling and coordination of care with the patient  This note was generated in part or whole with voice recognition software. Voice recognition is usually quite accurate but there are transcription errors that can and very often do occur. I apologize for any typographical errors that were not detected and corrected.  Medication Adjustments/Labs and Tests Ordered: Current medicines are reviewed at length with the patient today.  Concerns regarding medicines are outlined above.  Orders Placed This Encounter  Procedures  . CT ADRENAL ABD WO  . Renin  . Aldosterone + renin activity w/ ratio  . EKG 12-Lead  . VAS US RENAL ARTERY DUPLEX   Meds ordered this encounter  Medications  . hydrALAZINE (APRESOLINE) 25 MG tablet    Sig: Take 1 tablet (25 mg total) by mouth 3 (three) times daily.    Dispense:  270 tablet    Refill:  3    Patient Instructions  Medication Instructions:  Your physician has recommended you make the following change in your medication:  1. STOP Spironolactone 2. START Hydralazine 25 mg three times a day  *If you need a refill on your cardiac medications before your next appointment, please call your pharmacy*  Lab Work: Aldosterone + renin activity w/ ratio also with plasma renin. Take lab slips and go to [facility] Entr[facility] to have done in roughly 6 weeks which would be 01/02/2019. Go first thing in the morning if you can.   If you have labs (blood work) drawn today and your tests are completely normal, you will receive your results only by: Marland Kitchen MyChart Message (if you have MyChart) OR . A paper copy in the mail If you have any lab test that is abnormal or we need to change your treatment, we will call you to review  the results.  Testing/Procedures: CT scan of the abdomen with and without contrast This will evaluate the adrenal  and renal arteries Nothing to eat or drink after midnight before having this done. Call [phone] and select option 3 if you should need to reschedule.  Thursday December 3rd at 08:00 AM here at [facility].  PICK UP CONTRAST  Your physician has requested that you have a renal artery duplex. During this test, an ultrasound is used to evaluate blood flow to the kidneys. Allow one hour for this exam. Do not eat after midnight the day before and avoid carbonated beverages. Take your medications as you usually do.     Follow-Up: At [facility], you and your health needs are our priority.  As part of our continuing mission to provide you with exceptional heart care, we have created designated Provider Care Teams.  These Care Teams include your primary Cardiologist (physician) and Advanced Practice Providers (APPs -  Physician Assistants and Nurse Practitioners) who all work together to provide you with the care you need, when you need it.  Your next appointment:   2 month(s) if your testing is done before then you can call back and schedule earlier appointment so he can review results with you in person.   The format for your next appointment:   In Person  Provider:   Kate Sable, MD    How to Take Your Blood Pressure You can take your blood pressure at home with a machine. You may need to check your blood pressure at home:  To check if you have high blood pressure (hypertension).  To check your blood pressure over time.  To make sure your blood pressure medicine is working. Supplies needed: You will need a blood pressure machine, or monitor. You can buy one at a drugstore or online. When choosing one:  Choose one with an arm cuff.  Choose one that wraps around your upper arm. Only one finger should fit between your arm and the cuff.  Do not  choose one that measures your blood pressure from your wrist or finger. Your doctor can suggest a monitor. How to prepare Avoid these things for 30 minutes before checking your blood pressure:  Drinking caffeine.  Drinking alcohol.  Eating.  Smoking.  Exercising. Five minutes before checking your blood pressure:  Pee.  Sit in a dining chair. Avoid sitting in a soft couch or armchair.  Be quiet. Do not talk. How to take your blood pressure Follow the instructions that came with your machine. If you have a digital blood pressure monitor, these may be the instructions: 1. Sit up straight. 2. Place your feet on the floor. Do not cross your ankles or legs. 3. Rest your left arm at the level of your heart. You may rest it on a table, desk, or chair. 4. Pull up your shirt sleeve. 5. Wrap the blood pressure cuff around the upper part of your left arm. The cuff should be 1 inch (2.5 cm) above your elbow. It is best to wrap the cuff around bare skin. 6. Fit the cuff snugly around your arm. You should be able to place only one finger between the cuff and your arm. 7. Put the cord inside the groove of your elbow. 8. Press the power button. 9. Sit quietly while the cuff fills with air and loses air. 10. Write down the numbers on the screen. 11. Wait 2-3 minutes and then repeat steps 1-10. What do the numbers mean? Two numbers make up your blood pressure. The first number is called systolic pressure. The second is called  diastolic pressure. An example of a blood pressure reading is "120 over 80" (or 120/80). If you are an adult and do not have a medical condition, use this guide to find out if your blood pressure is normal: Normal  First number: below 120.  Second number: below 80. Elevated  First number: 120-129.  Second number: below 80. Hypertension stage 1  First number: 130-139.  Second number: 80-89. Hypertension stage 2  First number: 140 or above.  Second number: 90  or above. Your blood pressure is above normal even if only the top or bottom number is above normal. Follow these instructions at home:  Check your blood pressure as often as your doctor tells you to.  Take your monitor to your next doctor's appointment. Your doctor will: ? Make sure you are using it correctly. ? Make sure it is working right.  Make sure you understand what your blood pressure numbers should be.  Tell your doctor if your medicines are causing side effects. Contact a doctor if:  Your blood pressure keeps being high. Get help right away if:  Your first blood pressure number is higher than 180.  Your second blood pressure number is higher than 120. This information is not intended to replace advice given to you by your health care provider. Make sure you discuss any questions you have with your health care provider. Document Released: 12/02/2007 Document Revised: 12/01/2016 Document Reviewed: 05/28/2015 Elsevier Patient Education  2020 [employer].     Signed, Debbe Odea, MD  11/21/2018 1:43 PM    [facility]

## 2018-12-05 ENCOUNTER — Other Ambulatory Visit: Payer: Self-pay

## 2018-12-05 ENCOUNTER — Ambulatory Visit
Admission: RE | Admit: 2018-12-05 | Discharge: 2018-12-05 | Disposition: A | Discharge status: Home / Self Care | Payer: Medicaid Other | Source: Ambulatory Visit | Attending: Cardiology | Admitting: Cardiology

## 2018-12-05 ENCOUNTER — Other Ambulatory Visit: Payer: Self-pay | Admitting: Cardiology

## 2018-12-05 DIAGNOSIS — I10 Essential (primary) hypertension: Secondary | ICD-10-CM

## 2018-12-06 ENCOUNTER — Telehealth: Payer: Self-pay

## 2018-12-06 NOTE — Telephone Encounter (Signed)
-----   Message from Kate Sable, MD sent at 12/06/2018 12:45 PM EST ----- Her abdominal CT showed normal adrenal appearance.  Patient has resistant hypertension with swings.  Please refer patient to our hypertension clinic in [place] for any additional input.

## 2018-12-06 NOTE — Telephone Encounter (Signed)
Patient made aware of Abdominal CT results and Dr.Agbor-Etang's recommendation. Patient is agreeable to the ref to our hypertension clinic in [place]. Advised the patient that a scheduler will contact her to schedule. Patient verbalized understanding and voiced appreciation for the call.

## 2018-12-12 ENCOUNTER — Other Ambulatory Visit: Payer: Self-pay

## 2018-12-12 NOTE — Telephone Encounter (Signed)
*  STAT* If patient is at the pharmacy, call can be transferred to refill team.   1. Which medications need to be refilled? (please list name of each medication and dose if known) Carvedilol  2. Which pharmacy/location (including street and city if local pharmacy) is medication to be sent to? [facility]  3. Do they need a 30 day or 90 day supply? [place]

## 2018-12-12 NOTE — Telephone Encounter (Signed)
Did you speak with her by phone?  We can certainly send in coreg 25 mg BID. Didn't know if you had spoken with her and mentioned this?

## 2018-12-12 NOTE — Telephone Encounter (Signed)
Yes, Pt is ok with taking Carvedilol 25 mg 1 tablet bid instead of doing Carvedilol 12.5 mg 2 tablets bid. She would like less pills if possible.

## 2018-12-12 NOTE — Telephone Encounter (Signed)
Please advise for Carvedilol 12.5 mg 2 tablets bid. Pt is taking 2 12.5 tablets bid. Could a New Rx for Carvedilol 25 mg tablet bid work better for pt? Less pills; Correct dosage.

## 2018-12-13 MED ORDER — CARVEDILOL 25 MG PO TABS: 25.0000 mg | ORAL_TABLET | Freq: Two times a day (BID) | ORAL | 3 refills | Status: DC

## 2018-12-13 NOTE — Telephone Encounter (Signed)
RX sent in for coreg 25 mg- take 1 tablet by mouth twice daily

## 2018-12-19 ENCOUNTER — Ambulatory Visit (INDEPENDENT_AMBULATORY_CARE_PROVIDER_SITE_OTHER): Payer: Medicaid Other | Admitting: Pharmacist Clinician (PhC)/ Clinical Pharmacy Specialist

## 2018-12-19 ENCOUNTER — Other Ambulatory Visit: Payer: Self-pay

## 2018-12-19 DIAGNOSIS — I10 Essential (primary) hypertension: Secondary | ICD-10-CM | POA: Diagnosis not present

## 2018-12-19 MED ORDER — AMLODIPINE BESYLATE 5 MG PO TABS: 5.0000 mg | ORAL_TABLET | Freq: Every day | ORAL | 5 refills | Status: DC

## 2018-12-19 NOTE — Patient Instructions (Signed)
Return for a a follow up appointment with Dr. Garen Lah in January   Check your blood pressure at home daily (if able) and keep record of the readings.  Take your BP meds as follows:  Add amlodipine 5 mg once daily   Continue with all other medications  Bring all of your meds, your BP cuff and your record of home blood pressures to your next appointment.  Exercise as you're able, try to walk approximately 30 minutes per day.  Keep salt intake to a minimum, especially watch canned and prepared boxed foods.  Eat more fresh fruits and vegetables and fewer canned items.  Avoid eating in fast food restaurants.    HOW TO TAKE YOUR BLOOD PRESSURE: . Rest 5 minutes before taking your blood pressure. .  Don't smoke or drink caffeinated beverages for at least 30 minutes before. . Take your blood pressure before (not after) you eat. . Sit comfortably with your back supported and both feet on the floor (don't cross your legs). . Elevate your arm to heart level on a table or a desk. . Use the proper sized cuff. It should fit smoothly and snugly around your bare upper arm. There should be enough room to slip a fingertip under the cuff. The bottom edge of the cuff should be 1 inch above the crease of the elbow. . Ideally, take 3 measurements at one sitting and record the average.

## 2018-12-19 NOTE — Progress Notes (Signed)
12/23/2018 Connie Whitney 1980/03/31 [id number]   HPI:  Connie Whitney is a 38 y.o. female patient of Dr Azucena Cecil, with a PMH below who presents today for hypertension clinic evaluation.  Other than hypertension, her medical history is significant for chronic pain and history of gastric bypass.   On Nov 2 she saw MD with BP 164/110.  Was started on spironolactone 25 mg qd in addition to carvedilol 25 mg bid, lisinopril 20 mg bid.  She had previously tried BiDil, but this caused severe hypotension.  Spironolactone was tried, as was hydralazine.  Her cardiologist has been looking into secondary causes due to her young age and lack of improvement with various medications.  Serum metanephrines were found to be WNL.  He stopped the spironolactone in order to check renin and aldosterone levels, and she is scheduled for renal dopplers tomorrow. She has also been tested for lupus, and states that although the markers were negative, rheumatology will continue to monitor.    She is here today for PharmD evaluation.  She tells me that the gastric bypass surgery (2018) was done as a way for her to lose weight and therefore improve her BP.   She has lost 70 pounds total since surgery, although gained 10 back recently with pandemic lock-downs.  She notes that her BP is still unchanged.    Blood Pressure Goal:  130/80  Current Medications: lisinopril 20 mg bid, carvedilol 25 mg bid  Family Hx:  Father with hypertension, now 62 - his family history significant for HTN, DM2, CHF, stroke, dementia  Mother with hypertension, hyperlipidemia, borderline DM, now 38 - family history significant for DJD, HTN, stroke   No siblings, one child, now 34 yrs old  Social Hx: denies tobacco, alcohol or illicit drug use; drinks soda - Mt Dew on bad day 3 cans, 1 on good days  Diet: mostly home cooked foods, no added salts; veggies all fresh or frozen; no pork, usually chicken, or seafood, rare beef; snacks on nuts,  cheese, pepperoni or [place]  Exercise:  Gym 2-3 times per week - both CV and resistance training  Home BP readings: still uncontrolled, highest reading was 224/130.  Diastolic readings still commontly > 100.   Intolerances: BiDil caused hypotension; hydrochlorothiazide caused hypokalemia (may have ongoing hypokalemia issues? - not just med caused?)  Labs: 11/2018:  Na 138, K 3.3, Glu 149, BUN 15, SCr 0.96, GFR AA >60  Wt Readings from Last 3 Encounters:  12/19/18 217 lb (98.4 kg)  11/21/18 216 lb 12 oz (98.3 kg)  11/04/18 212 lb 8 oz (96.4 kg)   BP Readings from Last 3 Encounters:  12/19/18 (!) 166/110  11/21/18 (!) 160/100  11/04/18 (!) 164/110   Pulse Readings from Last 3 Encounters:  12/19/18 81  11/21/18 78  11/04/18 62    Current Outpatient Medications  Medication Sig Dispense Refill  . AMITRIPTYLINE HCL PO Take 10 mg by mouth 1 day or 1 dose. Takes as needed    . carvedilol (COREG) 25 MG tablet Take 1 tablet (25 mg total) by mouth 2 (two) times daily. 180 tablet 3  . gabapentin (NEURONTIN) 300 MG capsule Take 3 capsules (900 mg total) by mouth at bedtime. (Patient taking differently: Take by mouth at bedtime. 2 capsules in a.m. 2 capsules in afternoon 3 capsules at bedtime) 90 capsule 2  . hydrALAZINE (APRESOLINE) 25 MG tablet Take 1 tablet (25 mg total) by mouth 3 (three) times daily. 270 tablet 3  .  IRON PO Take 1 tablet by mouth 2 (two) times daily.    . Linaclotide (LINZESS) 290 MCG CAPS capsule Take 290 mcg by mouth daily.    Marland Kitchen lisinopril (ZESTRIL) 20 MG tablet Take 1 tablet (20 mg total) by mouth 2 (two) times daily. 180 tablet 1  . Magnesium 500 MG CAPS Take 1 capsule (500 mg total) by mouth 2 (two) times daily at 8 am and 10 pm. 60 capsule 2  . orphenadrine (NORFLEX) 100 MG tablet Take 1 tablet (100 mg total) by mouth 2 (two) times daily as needed for muscle spasms or mild pain. 60 tablet 2  . oxyCODONE (OXY IR/ROXICODONE) 5 MG immediate release tablet Take 1  tablet (5 mg total) by mouth every 6 (six) hours as needed for severe pain. Must last 30 days. (Patient taking differently: Take 7.5 mg by mouth every 6 (six) hours as needed for severe pain. Must last 30 days.) 120 tablet 0  . amLODipine (NORVASC) 5 MG tablet Take 1 tablet (5 mg total) by mouth daily. 30 tablet 5  . potassium chloride (K-DUR,KLOR-CON) 10 MEQ tablet Take 10 mEq by mouth 2 (two) times daily.     No current facility-administered medications for this visit.    Allergies  Allergen Reactions  . Lac Bovis     Other reaction(s): Vomiting  . Milk-Related Compounds   . Pravastatin   . Venlafaxine     Past Medical History:  Diagnosis Date  . Anemia   . Arthritis   . Fibromyalgia   . Hypertension   . Lupus ([place])   . Migraines   . Neuropathy   . Vitamin D deficiency     Blood pressure (!) 166/110, pulse 81, resp. rate 14, height 5\' 11"  (1.803 m), weight 217 lb (98.4 kg), SpO2 97 %.  Hypertension Patient with systolic/diastolic hypertension, still poorly controlled.  We had a long discussion about secondary causes and treatment options.  She has poor mineral absorption since her gastric bypass, with no improvement in BP, despite 60 pound weight loss.  She is scheduled for renal dopplers tomorrow as well as plasma renin and aldosterone in the near future.  In addition to these potential secondary causes, I will do some research on medication absorption post gastric bypass.  It could be that she is not seeing response in these medications due to specific drug permeability and solubility differences.  I will get any information that may help to Dr. Garen Lah before her January visit with him.    Tommy Medal PharmD CPP [facility] [address] [place],  [address] [phone]

## 2018-12-20 ENCOUNTER — Ambulatory Visit (INDEPENDENT_AMBULATORY_CARE_PROVIDER_SITE_OTHER): Payer: Medicaid Other

## 2018-12-20 DIAGNOSIS — I10 Essential (primary) hypertension: Secondary | ICD-10-CM

## 2018-12-23 DIAGNOSIS — I10 Essential (primary) hypertension: Secondary | ICD-10-CM | POA: Insufficient documentation

## 2018-12-23 NOTE — Assessment & Plan Note (Addendum)
Patient with systolic/diastolic hypertension, still poorly controlled.  We had a long discussion about secondary causes and treatment options.  She has poor mineral absorption since her gastric bypass, with no improvement in BP, despite 60 pound weight loss.  She is scheduled for renal dopplers tomorrow as well as plasma renin and aldosterone in the near future.  In addition to these potential secondary causes, I will do some research on medication absorption post gastric bypass.  It could be that she is not seeing response in these medications due to specific drug permeability and solubility differences.  I will get any information that may help to Dr. Garen Lah before her January visit with him.

## 2019-01-13 ENCOUNTER — Other Ambulatory Visit
Admission: RE | Admit: 2019-01-13 | Discharge: 2019-01-13 | Disposition: A | Discharge status: Home / Self Care | Payer: Medicaid Other | Source: Ambulatory Visit | Attending: Cardiology | Admitting: Cardiology

## 2019-01-13 DIAGNOSIS — I10 Essential (primary) hypertension: Secondary | ICD-10-CM | POA: Diagnosis present

## 2019-01-18 LAB — ALDOSTERONE + RENIN ACTIVITY W/ RATIO
ALDO / PRA Ratio: 10.4 (ref 0.0–30.0)
Aldosterone: 13.9 ng/dL (ref 0.0–30.0)
PRA LC/MS/MS: 1.339 ng/mL/hr (ref 0.167–5.380)

## 2019-01-20 ENCOUNTER — Ambulatory Visit: Payer: Medicaid Other | Admitting: Cardiology

## 2019-01-21 ENCOUNTER — Telehealth: Payer: Self-pay | Admitting: *Deleted

## 2019-01-21 ENCOUNTER — Encounter: Payer: Self-pay | Admitting: Cardiology

## 2019-01-21 NOTE — Telephone Encounter (Signed)
Parris-GodleyMonia Sabal, RN  Burnell Blanks, LPN  [facility],   Dr. Azucena Cecil would like to refer this patient to Dr. Leonides Sake hypertension clinic.  Do you know what I need to do to facilitate?    Left message to call back

## 2019-02-04 NOTE — Progress Notes (Deleted)
Office Visit    Patient Name: Connie Whitney Date of Encounter: 02/04/2019  Primary Care Provider:  [facility] Primary Cardiologist:  Debbe Odea, MD Electrophysiologist:  None   Chief Complaint    Connie Whitney is a 39 y.o. female with a hx of *** presents today for ***   Past Medical History    Past Medical History:  Diagnosis Date  . Anemia   . Arthritis   . Fibromyalgia   . Hypertension   . Lupus (HCC)   . Migraines   . Neuropathy   . Vitamin D deficiency    Past Surgical History:  Procedure Laterality Date  . BARIATRIC SURGERY    . CESAREAN SECTION    . DILATION AND CURETTAGE, DIAGNOSTIC / THERAPEUTIC    . ESOPHAGOGASTRODUODENOSCOPY (EGD) WITH PROPOFOL N/A 08/02/2016   Procedure: ESOPHAGOGASTRODUODENOSCOPY (EGD) WITH PROPOFOL;  Surgeon: Scot Jun, MD;  Location: [facility] ENDOSCOPY;  Service: Endoscopy;  Laterality: N/A;  . HYSTEROSCOPY WITH NOVASURE N/A 08/21/2014   Procedure: HYSTEROSCOPY WITH NOVASURE;  Surgeon: [place] Bing, MD;  Location: [facility] ORS;  Service: Gynecology;  Laterality: N/A;  . TUBAL LIGATION      Allergies  Allergies  Allergen Reactions  . Lac Bovis     Other reaction(s): Vomiting  . Milk-Related Compounds   . Pravastatin   . Venlafaxine     History of Present Illness    Connie Whitney is a 39 y.o. female with a hx of fibromyalgia, resistant hypertension, arthritis, chest pain, hypokalemia, obesity s/p gastric bypass in 2018. She was last seen by Dr. Azucena Cecil 11/21/18 and by hypertension clinic 12/19/18.  Hx of chest pain with subsequent echo and stress test 10/2018 being unrevealing. Echo 10/31/18 LVEF 60-65%, no LVH, gr1DD, RV normal size and function, mild MR/TR. Chest discomfort presumed to be due to uncontrolled hypertension.   11/11/18 labs notable for K 3.3, normal kidney function, normal metanephrine level. At office visit 11/21/18 spironolactone was stopped to proceed with renal evaluation  for resistant hypertension. She was started on Hydralazine 25mg  TID. CT 12/2018 with normal adrenal appearance. Renal duplex 12/2018 with no renal artery stenosis. Plasma renin and plasma aldosterone levels were normal.   Hx of hypotension on Bidil. HCTZ with reported hypokalemia, but this may be an ongoing issue.   Seen by 01/2019 [facility] as part of hypertension clinic. Amlodipine 5mg  daily was added to her regimen. There was concern that due to her hx of gastric bypass absorption of medications could be impeded.  EKGs/Labs/Other Studies Reviewed:   The following studies were reviewed today: Echo 10/31/18  1. Left ventricular ejection fraction, by visual estimation, is 60 to  65%. The left ventricle has normal function. There is no left ventricular  hypertrophy.   2. Left ventricular diastolic parameters are consistent with Grade I  diastolic dysfunction (impaired relaxation).   3. Global right ventricle has normal systolic function.The right  ventricular size is normal. No increase in right ventricular wall  thickness.   4. Left atrial size was normal.   5. Mild mitral valve regurgitation.   6. Tricuspid valve regurgitation is mild.   7. The aortic valve was not well visualized, Unable to exclude bicuspid  aortic valve.   8. Mildly elevated pulmonary artery systolic pressure.   VAS RENAL 12/20/18 Right: Normal size right kidney. Normal right Resisitive Index.         Normal cortical thickness of right kidney. No evidence of  right renal artery stenosis. RRV flow present.  Left:  Normal size of left kidney. Normal left Resistive Index.         Normal cortical thickness of the left kidney. No evidence of  left renal artery stenosis. LRV flow present.   CT Adrenal ABD wo contrast 12/05/18 IMPRESSION: 1. Bilateral adrenal glands are grossly normal in appearance. 2. Minimal atherosclerosis in the pelvic vasculature. Bilateral renal arteries are unremarkable in appearance  on today's noncontrast CT examination, but the patency and presence of stenosis in the renal arteries cannot be assessed on today's noncontrast examination  EKG:  EKG is ordered today.  The ekg ordered today demonstrates ***  Recent Labs: 03/06/2018: Magnesium 1.5 11/11/2018: BUN 15; Creatinine, Ser 0.96; Potassium 3.3; Sodium 138  Recent Lipid Panel No results found for: CHOL, TRIG, HDL, CHOLHDL, VLDL, LDLCALC, LDLDIRECT  Home Medications   No outpatient medications have been marked as taking for the 02/05/19 encounter (Appointment) with Loel Dubonnet, NP.      Review of Systems    ***   ROS All other systems reviewed and are otherwise negative except as noted above.  Physical Exam    VS:  There were no vitals taken for this visit. , BMI There is no height or weight on file to calculate BMI. GEN: Well nourished, well developed, in no acute distress. HEENT: normal. Neck: Supple, no JVD, carotid bruits, or masses. Cardiac: ***RRR, no murmurs, rubs, or gallops. No clubbing, cyanosis, edema.  ***Radials/DP/PT 2+ and equal bilaterally.  Respiratory:  ***Respirations regular and unlabored, clear to auscultation bilaterally. GI: Soft, nontender, nondistended, BS + x 4. MS: No deformity or atrophy. Skin: Warm and dry, no rash. Neuro:  Strength and sensation are intact. Psych: Normal affect.  Accessory Clinical Findings    ECG personally reviewed by me today - *** - no acute changes.  Assessment & Plan    1. ***  Disposition: Follow up {follow up:15908} with ***   Loel Dubonnet, NP 02/04/2019, 1:09 PM

## 2019-02-05 ENCOUNTER — Ambulatory Visit: Payer: Medicaid Other | Admitting: Family

## 2019-02-07 NOTE — Telephone Encounter (Signed)
Left message to call back  

## 2019-02-11 ENCOUNTER — Ambulatory Visit (INDEPENDENT_AMBULATORY_CARE_PROVIDER_SITE_OTHER): Payer: Medicaid Other | Admitting: Cardiology

## 2019-02-11 ENCOUNTER — Other Ambulatory Visit: Payer: Self-pay

## 2019-02-11 ENCOUNTER — Encounter: Payer: Self-pay | Admitting: Cardiology

## 2019-02-11 VITALS — BP 122/90 | HR 68 | Ht 71.0 in | Wt 222.5 lb

## 2019-02-11 DIAGNOSIS — I10 Essential (primary) hypertension: Secondary | ICD-10-CM

## 2019-02-11 DIAGNOSIS — M249 Joint derangement, unspecified: Secondary | ICD-10-CM

## 2019-02-11 MED ORDER — AMLODIPINE BESYLATE 5 MG PO TABS: 5.0000 mg | ORAL_TABLET | Freq: Two times a day (BID) | ORAL | 1 refills | Status: DC

## 2019-02-11 MED ORDER — SPIRONOLACTONE 25 MG PO TABS: 25.0000 mg | ORAL_TABLET | Freq: Every day | ORAL | 1 refills | Status: DC

## 2019-02-11 NOTE — Progress Notes (Signed)
Cardiology Office Note:    Date:  02/11/2019   ID:  Connie Whitney, DOB 06-24-1980, MRN [id number]  PCP:  [facility]  Cardiologist:  Debbe Odea, MD  Electrophysiologist:  None   Referring MD: [facility], [facility]*   Chief Complaint  Patient presents with  . office visit    2 month F/U after cardiac testing; Meds verbally reviewed with patient.    History of Present Illness:    Connie Whitney is a 39 y.o. female with a hx of fibromyalgia, hypertension, arthritis, who presents for follow-up.  She was originally seen for left-sided chest pain.  Echocardiogram and stress test were normal.  She has poorly controlled hypertension.  Work-up for secondary causes including renal artery ultrasound, ringing aldosterone studies, metanephrine studies, CT abdomen to evaluate adrenal mass have all been unremarkable.  BiDil was stopped after patient states her blood pressure bottomed out after taking BiDil.  Systolic blood pressure typically in the 90s and slowly go up throughout the day.  She states being on HCTZ before but that was also stopped likely due to low potassium levels.    She currently takes Coreg 25 mg twice daily,  lisinopril 20 mg daily, hydralazine 25 mg twice daily, Norvasc 5 mg daily.  Aldactone was started, but stopped in order to obtain renin/aldosterone levels.  She states her blood pressure is usually normal in the mornings but creeps up towards the evening to about 160s 170s systolic.  She also has a history of hypermobile joints with lumbar vertebrae displacement.  Has been seeing pain management for the past 5 years.  She is concerned she may have Marfan's.    Past Medical History:  Diagnosis Date  . Anemia   . Arthritis   . Fibromyalgia   . Hypertension   . Lupus (HCC)   . Migraines   . Neuropathy   . Vitamin D deficiency     Past Surgical History:  Procedure Laterality Date  . BARIATRIC SURGERY    . CESAREAN SECTION    .  DILATION AND CURETTAGE, DIAGNOSTIC / THERAPEUTIC    . ESOPHAGOGASTRODUODENOSCOPY (EGD) WITH PROPOFOL N/A 08/02/2016   Procedure: ESOPHAGOGASTRODUODENOSCOPY (EGD) WITH PROPOFOL;  Surgeon: Scot Jun, MD;  Location: [facility] ENDOSCOPY;  Service: Endoscopy;  Laterality: N/A;  . HYSTEROSCOPY WITH NOVASURE N/A 08/21/2014   Procedure: HYSTEROSCOPY WITH NOVASURE;  Surgeon: [place] Bing, MD;  Location: [facility] ORS;  Service: Gynecology;  Laterality: N/A;  . TUBAL LIGATION      Current Medications: Current Meds  Medication Sig  . AMITRIPTYLINE HCL PO Take 10 mg by mouth 1 day or 1 dose. Takes as needed  . carvedilol (COREG) 25 MG tablet Take 1 tablet (25 mg total) by mouth 2 (two) times daily.  Marland Kitchen gabapentin (NEURONTIN) 300 MG capsule Take 3 capsules (900 mg total) by mouth at bedtime. (Patient taking differently: Take by mouth at bedtime. 2 capsules in a.m. 2 capsules in afternoon 3 capsules at bedtime)  . hydrALAZINE (APRESOLINE) 25 MG tablet Take 1 tablet (25 mg total) by mouth 3 (three) times daily.  . IRON PO Take 1 tablet by mouth 2 (two) times daily.  . Linaclotide (LINZESS) 290 MCG CAPS capsule Take 290 mcg by mouth daily.  Marland Kitchen lisinopril (ZESTRIL) 20 MG tablet Take 1 tablet (20 mg total) by mouth 2 (two) times daily.  . Magnesium 500 MG CAPS Take 1 capsule (500 mg total) by mouth 2 (two) times daily at 8 am and 10 pm.  .  orphenadrine (NORFLEX) 100 MG tablet Take 1 tablet (100 mg total) by mouth 2 (two) times daily as needed for muscle spasms or mild pain.  Marland Kitchen oxyCODONE (OXY IR/ROXICODONE) 5 MG immediate release tablet Take 1 tablet (5 mg total) by mouth every 6 (six) hours as needed for severe pain. Must last 30 days. (Patient taking differently: Take 7.5 mg by mouth every 6 (six) hours as needed for severe pain. Must last 30 days.)  . potassium chloride (K-DUR,KLOR-CON) 10 MEQ tablet Take 10 mEq by mouth 2 (two) times daily.  . [DISCONTINUED] amLODipine (NORVASC) 5 MG tablet Take 1 tablet (5 mg  total) by mouth daily.     Allergies:   Lac bovis, Milk-related compounds, Pravastatin, and Venlafaxine   Social History   Socioeconomic History  . Marital status: Single    Spouse name: Not on file  . Number of children: Not on file  . Years of education: Not on file  . Highest education level: Not on file  Occupational History  . Not on file  Tobacco Use  . Smoking status: Never Smoker  . Smokeless tobacco: Never Used  Substance and Sexual Activity  . Alcohol use: No  . Drug use: No  . Sexual activity: Never  Other Topics Concern  . Not on file  Social History Narrative  . Not on file   Social Determinants of Health   Financial Resource Strain:   . Difficulty of Paying Living Expenses: Not on file  Food Insecurity:   . Worried About Charity fundraiser in the Last Year: Not on file  . Ran Out of Food in the Last Year: Not on file  Transportation Needs:   . Lack of Transportation (Medical): Not on file  . Lack of Transportation (Non-Medical): Not on file  Physical Activity:   . Days of Exercise per Week: Not on file  . Minutes of Exercise per Session: Not on file  Stress:   . Feeling of Stress : Not on file  Social Connections:   . Frequency of Communication with Friends and Family: Not on file  . Frequency of Social Gatherings with Friends and Family: Not on file  . Attends Religious Services: Not on file  . Active Member of Clubs or Organizations: Not on file  . Attends Archivist Meetings: Not on file  . Marital Status: Not on file     Family History: The patient's family history includes Heart murmur in her maternal grandfather and mother; Hyperlipidemia in her father; Hypertension in her father and mother.  ROS:   Please see the history of present illness.     All other systems reviewed and are negative.  EKGs/Labs/Other Studies Reviewed:    The following studies were reviewed today:  Echocardiogram dated 10/31/2018  1. Left ventricular  ejection fraction, by visual estimation, is 60 to 65%. The left ventricle has normal function. There is no left ventricular hypertrophy.  2. Left ventricular diastolic parameters are consistent with Grade I diastolic dysfunction (impaired relaxation).  3. Global right ventricle has normal systolic function.The right ventricular size is normal. No increase in right ventricular wall thickness.  4. Left atrial size was normal.  5. Mild mitral valve regurgitation.  6. Tricuspid valve regurgitation is mild.  7. The aortic valve was not well visualized, Unable to exclude bicuspid aortic valve.  8. Normal pulmonary pressure.  Pharmacological MPI study 10/10/2018 Pharmacological myocardial perfusion imaging study with no significant  ischemia Normal wall motion, EF estimated at  67% No EKG changes concerning for ischemia at peak stress or in recovery. Low risk scan  EKG:  EKG is  ordered today.  The ekg ordered today demonstrates normal sinus rhythm, normal ECG.  Recent Labs: 03/06/2018: Magnesium 1.5 11/11/2018: BUN 15; Creatinine, Ser 0.96; Potassium 3.3; Sodium 138  Recent Lipid Panel No results found for: CHOL, TRIG, HDL, CHOLHDL, VLDL, LDLCALC, LDLDIRECT  Physical Exam:    VS:  BP 122/90 (BP Location: Left Arm, Patient Position: Sitting, Cuff Size: Normal)   Pulse 68   Ht 5\' 11"  (1.803 m)   Wt 222 lb 8 oz (100.9 kg)   SpO2 98%   BMI 31.03 kg/m     Wt Readings from Last 3 Encounters:  02/11/19 222 lb 8 oz (100.9 kg)  12/19/18 217 lb (98.4 kg)  11/21/18 216 lb 12 oz (98.3 kg)     GEN:  Well nourished, well developed in no acute distress HEENT: Normal NECK: No JVD; No carotid bruits LYMPHATICS: No lymphadenopathy CARDIAC: RRR, 1/6 systolic murmur at the right upper sternal border.  No rubs or gallops. RESPIRATORY:  Clear to auscultation without rales, wheezing or rhonchi  ABDOMEN: Soft, non-tender, non-distended MUSCULOSKELETAL: Patient noted to have hypermobile joints. SKIN: Warm  and dry NEUROLOGIC:  Alert and oriented x 3 PSYCHIATRIC:  Normal affect   ASSESSMENT:     1. Uncontrolled hypertension 2. Hypermobility joints PLAN:    1. Patient with very difficult to control blood pressures.  Blood pressures are usually normal in the morning but increase to 150s/160s in the evenings.  Work-up for secondary causes including renal artery ultrasound, CT abdomen, metanephrine, renin and aldosterone studies have all been within normal limits.  Increase amlodipine to 5 mg twice daily, continue Coreg 25 mg twice daily, start spironolactone 25 mg nightly, hydralazine 25 mg 3 times daily.  We will try to schedule patient with our hypertension clinic.  2.  Patient with hypermobile joints.  We will try to refer patient within or without network for a musculoskeletal specialist who can you properly evaluate her for any muscular skeletal defect such as Marfan's or Ehlers-Danlos.  Total encounter time more than 41 minutes.  Spent explaining previous echo reports, Marfan syndrome, hypertension work-up, medication management.  Greater than 50% was spent in counseling and coordination of care with the patient  This note was generated in part or whole with voice recognition software. Voice recognition is usually quite accurate but there are transcription errors that can and very often do occur. I apologize for any typographical errors that were not detected and corrected.  Medication Adjustments/Labs and Tests Ordered: Current medicines are reviewed at length with the patient today.  Concerns regarding medicines are outlined above.  Orders Placed This Encounter  Procedures  . EKG 12-Lead   Meds ordered this encounter  Medications  . amLODipine (NORVASC) 5 MG tablet    Sig: Take 1 tablet (5 mg total) by mouth 2 (two) times daily.    Dispense:  180 tablet    Refill:  1  . spironolactone (ALDACTONE) 25 MG tablet    Sig: Take 1 tablet (25 mg total) by mouth at bedtime.    Dispense:   90 tablet    Refill:  1    Patient Instructions  Medication Instructions:  Your physician has recommended you make the following change in your medication:  1- CHANGE Norvasc to 5 mg by mouth two times a day. 2- START Spironolactone 25 mg (1 tablet) by mouth once daily  at bedtime.   *If you need a refill on your cardiac medications before your next appointment, please call your pharmacy*  Lab Work: none If you have labs (blood work) drawn today and your tests are completely normal, you will receive your results only by: Marland Kitchen MyChart Message (if you have MyChart) OR . A paper copy in the mail If you have any lab test that is abnormal or we need to change your treatment, we will call you to review the results.  Testing/Procedures: none  Follow-Up: 1- You have been referred to High Blood Pressure clinic with Dr Duke Salvia at our [facility] at [facility] in [place]. Address listed with appointment.  2- You have been referred to doctor concerning your  hypermobile joints with lumbar vertebrae displacement. We will call you or have them contact you for an appointment.   3- At [facility], you and your health needs are our priority.  As part of our continuing mission to provide you with exceptional heart care, we have created designated Provider Care Teams.  These Care Teams include your primary Cardiologist (physician) and Advanced Practice Providers (APPs -  Physician Assistants and Nurse Practitioners) who all work together to provide you with the care you need, when you need it.  Your next appointment:   3 month(s)  The format for your next appointment:   In Person  Provider:   Debbe Odea, MD     Signed, Debbe Odea, MD  02/11/2019 10:49 AM    [facility]

## 2019-02-11 NOTE — Patient Instructions (Addendum)
Medication Instructions:  Your physician has recommended you make the following change in your medication:  1- CHANGE Norvasc to 5 mg by mouth two times a day. 2- START Spironolactone 25 mg (1 tablet) by mouth once daily at bedtime.   *If you need a refill on your cardiac medications before your next appointment, please call your pharmacy*  Lab Work: none If you have labs (blood work) drawn today and your tests are completely normal, you will receive your results only by: Marland Kitchen MyChart Message (if you have MyChart) OR . A paper copy in the mail If you have any lab test that is abnormal or we need to change your treatment, we will call you to review the results.  Testing/Procedures: none  Follow-Up: 1- You have been referred to High Blood Pressure clinic with Dr Duke Salvia at our [facility] at [facility] in [place]. Address listed with appointment.  2- You have been referred to doctor concerning your  hypermobile joints with lumbar vertebrae displacement. We will call you or have them contact you for an appointment.   3- At [facility], you and your health needs are our priority.  As part of our continuing mission to provide you with exceptional heart care, we have created designated Provider Care Teams.  These Care Teams include your primary Cardiologist (physician) and Advanced Practice Providers (APPs -  Physician Assistants and Nurse Practitioners) who all work together to provide you with the care you need, when you need it.  Your next appointment:   3 month(s)  The format for your next appointment:   In Person  Provider:   Debbe Odea, MD

## 2019-02-11 NOTE — Telephone Encounter (Signed)
Patient scheduled 2/25.

## 2019-02-27 ENCOUNTER — Other Ambulatory Visit: Payer: Self-pay

## 2019-02-27 ENCOUNTER — Encounter: Payer: Self-pay | Admitting: Cardiovascular Disease

## 2019-02-27 ENCOUNTER — Ambulatory Visit (INDEPENDENT_AMBULATORY_CARE_PROVIDER_SITE_OTHER): Payer: Medicaid Other | Admitting: Cardiovascular Disease

## 2019-02-27 ENCOUNTER — Telehealth: Payer: Self-pay | Admitting: Radiology

## 2019-02-27 VITALS — BP 183/116 | HR 79 | Ht 71.0 in | Wt 219.0 lb

## 2019-02-27 DIAGNOSIS — I10 Essential (primary) hypertension: Secondary | ICD-10-CM

## 2019-02-27 DIAGNOSIS — R0789 Other chest pain: Secondary | ICD-10-CM | POA: Diagnosis not present

## 2019-02-27 DIAGNOSIS — R002 Palpitations: Secondary | ICD-10-CM

## 2019-02-27 DIAGNOSIS — E669 Obesity, unspecified: Secondary | ICD-10-CM | POA: Diagnosis not present

## 2019-02-27 MED ORDER — CLONIDINE 0.1 MG/24HR TD PTWK: 0.1000 mg | MEDICATED_PATCH | TRANSDERMAL | 12 refills | Status: DC

## 2019-02-27 MED ORDER — HYDRALAZINE HCL 50 MG PO TABS: 50.0000 mg | ORAL_TABLET | Freq: Three times a day (TID) | ORAL | 3 refills | Status: DC

## 2019-02-27 NOTE — Telephone Encounter (Signed)
Enrolled patient for a 30 day [employer] Event Monitor to be mailed to patients home  

## 2019-02-27 NOTE — Patient Instructions (Addendum)
Medication Instructions:  START CLONIDINE 0.1 PATCH APPLY WEEKLY  INCREASE YOUR HYDRALAZINE TO 50 MG THREE TIMES A DAY   Labwork: NONE   Testing/Procedures: Your physician has recommended that you wear an event monitor. Event monitors are medical devices that record the heart's electrical activity. Doctors most often [place] these monitors to diagnose arrhythmias. Arrhythmias are problems with the speed or rhythm of the heartbeat. The monitor is a small, portable device. You can wear one while you do your normal daily activities. This is usually used to diagnose what is causing palpitations/syncope (passing out). 30 DAY    Follow-Up: Your physician recommends that you schedule a follow-up appointment in: 1 MONTH WITH PHARM D RAQUEL 03/25/2019 at 2:30   Your physician wants you to follow-up in: 4 MONTHS WILL DR Bend IN HYPERTENSION CLINIC   WILL CALL YOU WITH APPOINTMENT    Special Instructions:   MONITOR AND LOG YOUR BLOOD PRESSURE TWICE A DAY. HAVE AVAILABLE FOR YOUR 1 MONTH FOLLOW UP     If you have questions regarding your blood pressure or any medication changes from Monday through Friday, 8 AM through 5 PM please call Rose Fillers, RN [phone]) or Viann Fish, RN [phone]).  If you are in need of emergency care after hours, please call 911 or be seen in the emergency room.  [employer] Cardiac Event Monitor Instructions Your physician has requested you wear your cardiac event monitor for _30____ days, (1-30). [employer] may call or text to confirm a shipping address. The monitor will be sent to a land address via UPS. [employer] will not ship a monitor to a PO BOX. It typically takes 3-5 days to receive your monitor after it has been enrolled. [employer] will assist with USPS tracking if your package is delayed. The telephone number for [employer] is [phone]. Once you have received your monitor, please review the enclosed instructions. Instruction tutorials can  also be viewed under help and settings on the enclosed cell phone. Your monitor has already been registered assigning a specific monitor serial # to you.  Applying the monitor Remove cell phone from case and turn it on. The cell phone works as IT consultant and needs to be within [employer] of you at all times. The cell phone will need to be charged on a daily basis. We recommend you plug the cell phone into the enclosed charger at your bedside table every night.  Monitor batteries: You will receive two monitor batteries labelled #1 and #2. These are your recorders. Plug battery #2 onto the second connection on the enclosed charger. Keep one battery on the charger at all times. This will keep the monitor battery deactivated. It will also keep it fully charged for when you need to switch your monitor batteries. A small light will be blinking on the battery emblem when it is charging. The light on the battery emblem will remain on when the battery is fully charged.  Open package of a Monitor strip. Insert battery #1 into black hood on strip and gently squeeze monitor battery onto connection as indicated in instruction booklet. Set aside while preparing skin.  Choose location for your strip, vertical or horizontal, as indicated in the instruction booklet. Shave to remove all hair from location. There cannot be any lotions, oils, powders, or colognes on skin where monitor is to be applied. Wipe skin clean with enclosed Saline wipe. Dry skin completely.  Peel paper labeled #1 off the back of the Monitor strip exposing the adhesive. Place the  monitor on the chest in the vertical or horizontal position shown in the instruction booklet. One arrow on the monitor strip must be pointing upward. Carefully remove paper labeled #2, attaching remainder of strip to your skin. Try not to create any folds or wrinkles in the strip as you apply it.  Firmly press and release the circle in the center of the  monitor battery. You will hear a small beep. This is turning the monitor battery on. The heart emblem on the monitor battery will light up every 5 seconds if the monitor battery in turned on and connected to the patient securely. Do not push and hold the circle down as this turns the monitor battery off. The cell phone will locate the monitor battery. A screen will appear on the cell phone checking the connection of your monitor strip. This may read poor connection initially but change to good connection within the next minute. Once your monitor accepts the connection you will hear a series of 3 beeps followed by a climbing crescendo of beeps. A screen will appear on the cell phone showing the two monitor strip placement options. Touch the picture that demonstrates where you applied the monitor strip.  Your monitor strip and battery are waterproof. You are able to shower, bathe, or swim with the monitor on. They just ask you do not submerge deeper than 3 feet underwater. We recommend removing the monitor if you are swimming in a lake, river, or ocean.  Your monitor battery will need to be switched to a fully charged monitor battery approximately once a week. The cell phone will alert you of an action which needs to be made.  On the cell phone, tap for details to reveal connection status, monitor battery status, and cell phone battery status. The green dots indicates your monitor is in good status. A red dot indicates there is something that needs your attention.  To record a symptom, click the circle on the monitor battery. In 30-60 seconds a list of symptoms will appear on the cell phone. Select your symptom and tap save. Your monitor will record a sustained or significant arrhythmia regardless of you clicking the button. Some patients do not feel the heart rhythm irregularities. [employer] will notify us of any serious or critical events.  Refer to instruction booklet for instructions on  switching batteries, changing strips, the Do not disturb or Pause features, or any additional questions.  Call [employer] at [phone], to confirm your monitor is transmitting and record your baseline. They will answer any questions you may have regarding the monitor instructions at that time.  Returning the monitor to [employer] Place all equipment back into blue box. Peel off strip of paper to expose adhesive and close box securely. There is a prepaid UPS shipping label on this box. Drop in a UPS drop box, or at a UPS facility like [employer]. You may also contact [employer] to arrange UPS to pick up monitor package at your home.    DASH Eating Plan DASH stands for "Dietary Approaches to Stop Hypertension." The DASH eating plan is a healthy eating plan that has been shown to reduce high blood pressure (hypertension). It may also reduce your risk for type 2 diabetes, heart disease, and stroke. The DASH eating plan may also help with weight loss. What are tips for following this plan?  General guidelines  Avoid eating more than 2,300 mg (milligrams) of salt (sodium) a day. If you have hypertension, you may need to reduce your  sodium intake to 1,500 mg a day.  Limit alcohol intake to no more than 1 drink a day for nonpregnant women and 2 drinks a day for men. One drink equals 12 oz of beer, 5 oz of wine, or 1 oz of hard liquor.  Work with your health care provider to maintain a healthy body weight or to lose weight. Ask what an ideal weight is for you.  Get at least 30 minutes of exercise that causes your heart to beat faster (aerobic exercise) most days of the week. Activities may include walking, swimming, or biking.  Work with your health care provider or diet and nutrition specialist (dietitian) to adjust your eating plan to your individual calorie needs. Reading food labels   Check food labels for the amount of sodium per serving. Choose foods with less than 5 percent of the  Daily Value of sodium. Generally, foods with less than 300 mg of sodium per serving fit into this eating plan.  To find whole grains, look for the word "whole" as the first word in the ingredient list. Shopping  Buy products labeled as "low-sodium" or "no salt added."  Buy fresh foods. Avoid canned foods and premade or frozen meals. Cooking  Avoid adding salt when cooking. Use salt-free seasonings or herbs instead of table salt or sea salt. Check with your health care provider or pharmacist before using salt substitutes.  Do not fry foods. Cook foods using healthy methods such as baking, boiling, grilling, and broiling instead.  Cook with heart-healthy oils, such as olive, canola, soybean, or sunflower oil. Meal planning  Eat a balanced diet that includes: ? 5 or more servings of fruits and vegetables each day. At each meal, try to fill half of your plate with fruits and vegetables. ? Up to 6-8 servings of whole grains each day. ? Less than 6 oz of lean meat, poultry, or fish each day. A 3-oz serving of meat is about the same size as a deck of cards. One egg equals 1 oz. ? 2 servings of low-fat dairy each day. ? A serving of nuts, seeds, or beans 5 times each week. ? Heart-healthy fats. Healthy fats called Omega-3 fatty acids are found in foods such as flaxseeds and coldwater fish, like sardines, salmon, and mackerel.  Limit how much you eat of the following: ? Canned or prepackaged foods. ? Food that is high in trans fat, such as fried foods. ? Food that is high in saturated fat, such as fatty meat. ? Sweets, desserts, sugary drinks, and other foods with added sugar. ? Full-fat dairy products.  Do not salt foods before eating.  Try to eat at least 2 vegetarian meals each week.  Eat more home-cooked food and less restaurant, buffet, and fast food.  When eating at a restaurant, ask that your food be prepared with less salt or no salt, if possible. What foods are  recommended? The items listed may not be a complete list. Talk with your dietitian about what dietary choices are best for you. Grains Whole-grain or whole-wheat bread. Whole-grain or whole-wheat pasta. Brown rice. Orpah Cobb. Bulgur. Whole-grain and low-sodium cereals. Pita bread. Low-fat, low-sodium crackers. Whole-wheat flour tortillas. Vegetables Fresh or frozen vegetables (raw, steamed, roasted, or grilled). Low-sodium or reduced-sodium tomato and vegetable juice. Low-sodium or reduced-sodium tomato sauce and tomato paste. Low-sodium or reduced-sodium canned vegetables. Fruits All fresh, dried, or frozen fruit. Canned fruit in natural juice (without added sugar). Meat and other protein foods Skinless chicken or  [place]. Ground chicken or [place]. Pork with fat trimmed off. Fish and seafood. Egg whites. Dried beans, peas, or lentils. Unsalted nuts, nut butters, and seeds. Unsalted canned beans. Lean cuts of beef with fat trimmed off. Low-sodium, lean deli meat. Dairy Low-fat (1%) or fat-free (skim) milk. Fat-free, low-fat, or reduced-fat cheeses. Nonfat, low-sodium ricotta or cottage cheese. Low-fat or nonfat yogurt. Low-fat, low-sodium cheese. Fats and oils Soft margarine without trans fats. Vegetable oil. Low-fat, reduced-fat, or light mayonnaise and salad dressings (reduced-sodium). Canola, safflower, olive, soybean, and sunflower oils. Avocado. Seasoning and other foods Herbs. Spices. Seasoning mixes without salt. Unsalted popcorn and pretzels. Fat-free sweets. What foods are not recommended? The items listed may not be a complete list. Talk with your dietitian about what dietary choices are best for you. Grains Baked goods made with fat, such as croissants, muffins, or some breads. Dry pasta or rice meal packs. Vegetables Creamed or fried vegetables. Vegetables in a cheese sauce. Regular canned vegetables (not low-sodium or reduced-sodium). Regular canned tomato sauce and paste (not  low-sodium or reduced-sodium). Regular tomato and vegetable juice (not low-sodium or reduced-sodium). Angie Fava. Olives. Fruits Canned fruit in a light or heavy syrup. Fried fruit. Fruit in cream or butter sauce. Meat and other protein foods Fatty cuts of meat. Ribs. Fried meat. Berniece Salines. Sausage. Bologna and other processed lunch meats. Salami. Fatback. Hotdogs. Bratwurst. Salted nuts and seeds. Canned beans with added salt. Canned or smoked fish. Whole eggs or egg yolks. Chicken or [place] with skin. Dairy Whole or 2% milk, cream, and half-and-half. Whole or full-fat cream cheese. Whole-fat or sweetened yogurt. Full-fat cheese. Nondairy creamers. Whipped toppings. Processed cheese and cheese spreads. Fats and oils Butter. Stick margarine. Lard. Shortening. Ghee. Bacon fat. Tropical oils, such as coconut, palm kernel, or palm oil. Seasoning and other foods Salted popcorn and pretzels. Onion salt, garlic salt, seasoned salt, table salt, and sea salt. Worcestershire sauce. Tartar sauce. Barbecue sauce. Teriyaki sauce. Soy sauce, including reduced-sodium. Steak sauce. Canned and packaged gravies. Fish sauce. Oyster sauce. Cocktail sauce. Horseradish that you find on the shelf. Ketchup. Mustard. Meat flavorings and tenderizers. Bouillon cubes. Hot sauce and Tabasco sauce. Premade or packaged marinades. Premade or packaged taco seasonings. Relishes. Regular salad dressings. Where to find more information:  National Heart, Lung, and [place]: https://wilson-eaton.com/  American Heart Association: www.heart.org Summary  The DASH eating plan is a healthy eating plan that has been shown to reduce high blood pressure (hypertension). It may also reduce your risk for type 2 diabetes, heart disease, and stroke.  With the DASH eating plan, you should limit salt (sodium) intake to 2,300 mg a day. If you have hypertension, you may need to reduce your sodium intake to 1,500 mg a day.  When on the DASH eating plan,  aim to eat more fresh fruits and vegetables, whole grains, lean proteins, low-fat dairy, and heart-healthy fats.  Work with your health care provider or diet and nutrition specialist (dietitian) to adjust your eating plan to your individual calorie needs. This information is not intended to replace advice given to you by your health care provider. Make sure you discuss any questions you have with your health care provider. Document Released: 12/08/2010 Document Revised: 12/01/2016 Document Reviewed: 12/13/2015 Elsevier Patient Education  2020 [employer].

## 2019-02-27 NOTE — Progress Notes (Signed)
Hypertension Clinic Initial Assessment:    Date:  03/12/2019   ID:  Connie Whitney, DOB 1980/05/15, MRN [id number]  PCP:  [facility] [place]  Cardiologist:  Connie Sable, MD  Nephrologist:  Referring MD: [facility]*   CC: Hypertension  History of Present Illness:    Connie Whitney is a 39 y.o. female with a hx of hypertension, fibromyalgia and arthritis here to establish care in the hypertension clinic.  She reports that she was initially diagnosed with hypertension 10 years ago.   Her blood pressure has never been very well-controlled.  She first saw Dr. Garen Whitney for L sided chest pain on 09/2018.  Her chest pain was atypical and non-exertional.  She was seen in the ED and cardiac enzymes were negative.  He referred her for [facility] 10/2018 that was negative for ischemia.  She followed up 11/2018 and reported flushing and headaches.  Therefore plasma metanephrines were checked and were within normal limits.  Spironolactone was added to her regimen without improvement in her blood pressure or symptoms. He also increased lisinopril to 20mg  and stopped Bidil.  Bidil caused hypotension to 40J systolic.  At the next appointment spironolactone was stopped and she was started on hydralazine.  At her last appointment 2/9 spironolactone was restarted and amlodipine was increased.  Since then she has not noted any change in her BP.  She had an adrenal CT 12/2018 that was unremarkable.  Labs were negative for hyperaldosteronism.  Renal artery Dopplers at that time were also within normal limits.  On 2/15 her BP became elevated to 194 mmHg.  She had an echo 10/2018 that revealed LVEF 60 to 65% and grade 1 diastolic dysfunction.  There was no LVH.  Renal artery Dopplers were normal 12/2018.  Renin, aldosterone and metanephrines were unremarkable.  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  She had gastric bypass surgery two years ago and lost 70lb.  However, her BP remained  elevated.  She follows a healthy diet, meal preps, limits salt and drinks a lot of water. She avoids caffeine and sugary beverages.  She denies EtOH or tobacco abuse.  She goes to the gym 2-3 days per week for 45 minutes to an hour and has no exertional symptoms.  She has struggled with episodes of palpitations.     CL- 5, 6.5   Previous antihypertensives: BiDil- BP too low HCTZ hypokalemia Spironolactone- held for testing Metoprolol didn't work  Past Medical History:  Diagnosis Date  . Anemia   . Arthritis   . Atypical chest pain 03/12/2019  . Fibromyalgia   . Hypertension   . Lupus ([place])   . Migraines   . Neuropathy   . Obesity (BMI 30.0-34.9) 03/12/2019  . Palpitations 03/12/2019  . Vitamin D deficiency     Past Surgical History:  Procedure Laterality Date  . BARIATRIC SURGERY    . CESAREAN SECTION    . DILATION AND CURETTAGE, DIAGNOSTIC / THERAPEUTIC    . ESOPHAGOGASTRODUODENOSCOPY (EGD) WITH PROPOFOL N/A 08/02/2016   Procedure: ESOPHAGOGASTRODUODENOSCOPY (EGD) WITH PROPOFOL;  Surgeon: Connie Silvas, MD;  [facility] END[facility]COPY;  Service: Endoscopy;  Laterality: N/A;  . HYSTEROSCOPY WITH NOVASURE N/A 08/21/2014   Procedure: HYSTEROSCOPY WITH NOVASURE;  Surgeon: Connie Halim, MD;  Location: [facility] ORS;  Service: Gynecology;  Laterality: N/A;  . TUBAL LIGATION      Current Medications: No outpatient medications have been marked as taking for the 02/27/19 encounter (Office Visit) with Skeet Latch, MD.  Allergies:   Lac bovis, Milk-related compounds, Pravastatin, and Venlafaxine   Social History   Socioeconomic History  . Marital status: Single    Spouse name: Not on file  . Number of children: Not on file  . Years of education: Not on file  . Highest education level: Not on file  Occupational History  . Not on file  Tobacco Use  . Smoking status: Never Smoker  . Smokeless tobacco: Never Used  Substance and Sexual Activity  . Alcohol use: No  .  Drug use: No  . Sexual activity: Never  Other Topics Concern  . Not on file  Social History Narrative  . Not on file   Social Determinants of Health   Financial Resource Strain:   . Difficulty of Paying Living Expenses: Not on file  Food Insecurity: No Food Insecurity  . Worried About Programme researcher, broadcasting/film/video in the Last Year: Never true  . Ran Out of Food in the Last Year: Never true  Transportation Needs: No Transportation Needs  . Lack of Transportation (Medical): No  . Lack of Transportation (Non-Medical): No  Physical Activity:   . Days of Exercise per Week: Not on file  . Minutes of Exercise per Session: Not on file  Stress:   . Feeling of Stress : Not on file  Social Connections:   . Frequency of Communication with Friends and Family: Not on file  . Frequency of Social Gatherings with Friends and Family: Not on file  . Attends Religious Services: Not on file  . Active Member of Clubs or Organizations: Not on file  . Attends Banker Meetings: Not on file  . Marital Status: Not on file     Family History: The patient's family history includes Heart failure in her paternal aunt; Heart murmur in her maternal grandfather and mother; Hyperlipidemia in her father; Hypertension in her father and mother; Stroke in her maternal grandfather.  ROS:   Please see the history of present illness.    All other systems reviewed and are negative.  EKGs/Labs/Other Studies Reviewed:    EKG:  EKG is not ordered today.    Recent Labs: 11/11/2018: BUN 15; Creatinine, Ser 0.96; Potassium 3.3; Sodium 138   Recent Lipid Panel No results found for: CHOL, TRIG, HDL, CHOLHDL, VLDL, LDLCALC, LDLDIRECT   Renal artery Dopplers 12/2018: Normal  Echo 10/31/18: IMPRESSIONS   1. Left ventricular ejection fraction, by visual estimation, is 60 to  65%. The left ventricle has normal function. There is no left ventricular  hypertrophy.  2. Left ventricular diastolic parameters are  consistent with Grade I  diastolic dysfunction (impaired relaxation).  3. Global right ventricle has normal systolic function.The right  ventricular size is normal. No increase in right ventricular wall  thickness.  4. Left atrial size was normal.  5. Mild mitral valve regurgitation.  6. Tricuspid valve regurgitation is mild.  7. The aortic valve was not well visualized, Unable to exclude bicuspid  aortic valve.  8. Mildly elevated pulmonary artery systolic pressure.    Physical Exam:    VS:  BP (!) 183/116   Pulse 79   Ht 5\' 11"  (1.803 m)   Wt 219 lb (99.3 kg)   SpO2 99%   BMI 30.54 kg/m     Wt Readings from Last 3 Encounters:  02/27/19 219 lb (99.3 kg)  02/11/19 222 lb 8 oz (100.9 kg)  12/19/18 217 lb (98.4 kg)     VS:  BP (!) 183/116  Pulse 79   Ht 5\' 11"  (1.803 m)   Wt 219 lb (99.3 kg)   SpO2 99%   BMI 30.54 kg/m  , BMI Body mass index is 30.54 kg/m. GENERAL:  Well appearing HEENT: Pupils equal round and reactive, fundi not visualized, oral mucosa unremarkable NECK:  No jugular venous distention, waveform within normal limits, carotid upstroke brisk and symmetric, no bruits LUNGS:  Clear to auscultation bilaterally HEART:  RRR.  PMI not displaced or sustained,S1 and S2 within normal limits, no S3, no S4, no clicks, no rubs, no murmurs ABD:  Flat, positive bowel sounds normal in frequency in pitch, no bruits, no rebound, no guarding, no midline pulsatile mass, no hepatomegaly, no splenomegaly EXT:  2 plus pulses throughout, no edema, no cyanosis no clubbing SKIN:  No rashes no nodules NEURO:  Cranial nerves II through XII grossly intact, motor grossly intact throughout PSYCH:  Cognitively intact, oriented to person place and time   ASSESSMENT:    1. Palpitations   2. Essential hypertension   3. Atypical chest pain   4. Obesity (BMI 30.0-34.9)     PLAN:    # Essential hypertension:  BP is quite elevated today both initially and on repeat.  She is  taking all medications as prescribed and quite motivated to control her BP.  She is very educated on exercise and nutrition and actively exercising.  Therefore, it is unlikely she will benefit much from the PREP program.  Also, she already has a very busy schedule with many stressors.  We will start a clonidine patch 0.1mg  weekly.  We will also increase hydralazine to 50mg  tid.  She had a very thorough work up for secondary causes of hypertension that was unremarkable.  Continue amlodipine, carvedilol, spironolactone, and lisinopril.  # Palpitations:  We will get a 30-day event monitor to better assess.  # Obesity:  Patient was encouraged to continue with diet and exercise.  Disposition:    FU with MD/PharmD in 1 month    Medication Adjustments/Labs and Tests Ordered: Current medicines are reviewed at length with the patient today.  Concerns regarding medicines are outlined above.  Orders Placed This Encounter  Procedures  . Cantril's Ladder Assessment  . CARDIAC EVENT MONITOR   Meds ordered this encounter  Medications  . cloNIDine (CATAPRES - DOSED IN MG/24 HR) 0.1 mg/24hr patch    Sig: Place 1 patch (0.1 mg total) onto the skin once a week.    Dispense:  4 patch    Refill:  12  . hydrALAZINE (APRESOLINE) 50 MG tablet    Sig: Take 1 tablet (50 mg total) by mouth 3 (three) times daily.    Dispense:  270 tablet    Refill:  3    New dose, d/c previous rx     Signed, 05-06-1992, MD  03/12/2019 10:36 AM    [facility]

## 2019-03-07 ENCOUNTER — Encounter (INDEPENDENT_AMBULATORY_CARE_PROVIDER_SITE_OTHER): Payer: Medicaid Other

## 2019-03-07 DIAGNOSIS — R002 Palpitations: Secondary | ICD-10-CM

## 2019-03-12 ENCOUNTER — Encounter: Payer: Self-pay | Admitting: Cardiovascular Disease

## 2019-03-12 ENCOUNTER — Telehealth: Payer: Self-pay | Admitting: Cardiovascular Disease

## 2019-03-12 DIAGNOSIS — R002 Palpitations: Secondary | ICD-10-CM | POA: Insufficient documentation

## 2019-03-12 DIAGNOSIS — E669 Obesity, unspecified: Secondary | ICD-10-CM

## 2019-03-12 DIAGNOSIS — E66811 Obesity, class 1: Secondary | ICD-10-CM

## 2019-03-12 DIAGNOSIS — R0789 Other chest pain: Secondary | ICD-10-CM

## 2019-03-12 HISTORY — DX: Other chest pain: R07.89

## 2019-03-12 HISTORY — DX: Palpitations: R00.2

## 2019-03-12 HISTORY — DX: Obesity, class 1: E66.811

## 2019-03-12 HISTORY — DX: Obesity, unspecified: E66.9

## 2019-03-12 NOTE — Telephone Encounter (Signed)
Pt c/o medication issue:  1. Name of Medication: hydrALAZINE (APRESOLINE) 50 MG tablet  2. How are you currently taking this medication (dosage and times per day)? 1 tablet by mouth 3 times daily   3. Are you having a reaction (difficulty breathing--STAT)? Yes   4. What is your medication issue? Connie Whitney is calling stating she is having a reaction to hydrALAZINE. she states it is causing her to have Headaches, Congestion, SOB and Drainage. Please advise.

## 2019-03-12 NOTE — Telephone Encounter (Signed)
Per pt since increasing Hydralazine to 50 mg tid pt has noted h/a (pt does suffer from migraines ) ,sob, and nasal drainage Pt has tried taking Tylenol BC powder,and allergy meds with no relief Will forward to Dr Duke Salvia for review and recommendations ./cy

## 2019-03-18 ENCOUNTER — Telehealth: Payer: Self-pay | Admitting: *Deleted

## 2019-03-18 NOTE — Telephone Encounter (Signed)
At last office visit, Dr. 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 Referral form from [facility] faxed to [phone]. Last OV note faxed as well via Epic fax.

## 2019-03-24 ENCOUNTER — Telehealth: Payer: Self-pay

## 2019-03-24 NOTE — Telephone Encounter (Signed)
lmomed the pt to call us back regarding their appointment tomorrow with the pharmacist to see which method they plan to use to log on

## 2019-03-25 ENCOUNTER — Telehealth (INDEPENDENT_AMBULATORY_CARE_PROVIDER_SITE_OTHER): Payer: Medicaid Other | Admitting: Pharmacist Clinician (PhC)/ Clinical Pharmacy Specialist

## 2019-03-25 DIAGNOSIS — Z5329 Procedure and treatment not carried out because of patient's decision for other reasons: Secondary | ICD-10-CM

## 2019-03-25 NOTE — Progress Notes (Signed)
Pt N/S for appointmernt

## 2019-03-26 NOTE — Telephone Encounter (Signed)
This was routed to wrong MD but had video visit with Pharm D yesterday

## 2019-04-02 NOTE — Telephone Encounter (Signed)
[facility] calling in to state they only received the referral form and no additional clinical or demographic information.

## 2019-04-02 NOTE — Telephone Encounter (Signed)
Office visit note and demographics refaxed to [facility] to fax number [phone].

## 2019-04-14 NOTE — Telephone Encounter (Signed)
Received fax from [facility] that not accepting referral for pain management of fibromyalgia or diagnoses Ehler's Danlos Syndrome, please refer to pain management clinic of pain psychologist.  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  She said they still have the referral and patient information scanned into their system. Next we would need to send over another request/letter asking for patient to be seen for evaluation. ------------------------------------------------------------------  Left message with patient to update her on the status of the referral. No answer left message with no identifying information that referral is in the process and to call back if she has any further questions.

## 2019-04-17 ENCOUNTER — Encounter: Payer: Self-pay | Admitting: *Deleted

## 2019-04-17 NOTE — Telephone Encounter (Signed)
This encounter was created in error - please disregard.

## 2019-04-24 NOTE — Telephone Encounter (Signed)
S/w Nettie Elm at [facility]. Patient has appointment on 09/11/19 at 8:15 am with Dr Danella Maiers.  I expressed my appreciation. Closing encounter.

## 2019-05-12 ENCOUNTER — Ambulatory Visit: Payer: Medicaid Other | Admitting: Cardiology

## 2019-05-23 ENCOUNTER — Ambulatory Visit: Payer: Medicaid Other | Admitting: Cardiology

## 2019-05-27 ENCOUNTER — Ambulatory Visit (INDEPENDENT_AMBULATORY_CARE_PROVIDER_SITE_OTHER): Payer: Medicaid Other | Admitting: Cardiology

## 2019-05-27 ENCOUNTER — Encounter: Payer: Self-pay | Admitting: Cardiology

## 2019-05-27 ENCOUNTER — Other Ambulatory Visit: Payer: Self-pay

## 2019-05-27 VITALS — BP 156/88 | HR 88 | Ht 71.0 in | Wt 226.0 lb

## 2019-05-27 DIAGNOSIS — M249 Joint derangement, unspecified: Secondary | ICD-10-CM

## 2019-05-27 DIAGNOSIS — I10 Essential (primary) hypertension: Secondary | ICD-10-CM | POA: Diagnosis not present

## 2019-05-27 MED ORDER — CLONIDINE HCL 0.2 MG/24HR TD PTWK: 0.2000 mg | MEDICATED_PATCH | TRANSDERMAL | 5 refills | Status: DC

## 2019-05-27 NOTE — Patient Instructions (Signed)
Medication Instructions:  Your physician has recommended you make the following change in your medication:   Increase your Clonidine patch to 0.2mg , wear 24 hours a day and change every 7 days.  *If you need a refill on your cardiac medications before your next appointment, please call your pharmacy*   Lab Work: None Ordered. If you have labs (blood work) drawn today and your tests are completely normal, you will receive your results only by: Marland Kitchen MyChart Message (if you have MyChart) OR . A paper copy in the mail If you have any lab test that is abnormal or we need to change your treatment, we will call you to review the results.   Testing/Procedures: None Ordered.   Follow-Up: At [facility], you and your health needs are our priority.  As part of our continuing mission to provide you with exceptional heart care, we have created designated Provider Care Teams.  These Care Teams include your primary Cardiologist (physician) and Advanced Practice Providers (APPs -  Physician Assistants and Nurse Practitioners) who all work together to provide you with the care you need, when you need it.  We recommend signing up for the patient portal called "MyChart".  Sign up information is provided on this After Visit Summary.  MyChart is used to connect with patients for Virtual Visits (Telemedicine).  Patients are able to view lab/test results, encounter notes, upcoming appointments, etc.  Non-urgent messages can be sent to your provider as well.   To learn more about what you can do with MyChart, go to [email or web address].    Your next appointment:   3 month(s)  The format for your next appointment:   In Person  Provider:   Debbe Odea, MD   Other Instructions N/A

## 2019-05-27 NOTE — Progress Notes (Signed)
Cardiology Office Note:    Date:  05/27/2019   ID:  Connie Whitney, DOB [birth year], MRN [id number]  PCP:  [facility]  Cardiologist:  Debbe Odea, MD  Electrophysiologist:  None   Referring MD: [facility]*   Chief Complaint  Patient presents with  . Other    3 month follow up. patient c.o some chest pain that comes and goes. meds reviewed verbally with patient.     History of Present Illness:    Connie Whitney is a 39 y.o. female with a hx of fibromyalgia, hypertension, arthritis, joint hypermobility, who presents for follow-up.  She is being seen due to difficult to control blood pressures.  Prior echocardiogram and stress test were normal.  She was seen in the hypertension clinic where clonidine patch was started and hydralazine increased to 50 mg 3 times daily.  Her blood pressures have improved but still elevated.  She is scheduled to see a specialist next month due to joint hypermobility.  She states having a lot of stressors in her life that could be contributing to elevated blood pressures.  She otherwise feels okay, has no new concerns at this time.   Historical notes BiDil was stopped after patient states her blood pressure bottomed out after taking BiDil.  Systolic blood pressure typically in the 90s and slowly go up throughout the day.  She states being on HCTZ before but that was also stopped likely due to low potassium levels.   She currently takes Coreg 25 mg twice daily,  lisinopril 20 mg daily, hydralazine 25 mg twice daily, Norvasc 5 mg daily.  Aldactone was started, but stopped in order to obtain renin/aldosterone levels.  She states her blood pressure is usually normal in the mornings but creeps up towards the evening to about 160s 170s systolic. She also has a history of hypermobile joints with lumbar vertebrae displacement.  Has been seeing pain management for the past 5 years.  She is concerned she may have Marfan's.   Work-up  for secondary causes including renal artery ultrasound, ringing aldosterone studies, metanephrine studies, CT abdomen to evaluate adrenal mass have all been unremarkable.  Past Medical History:  Diagnosis Date  . Anemia   . Arthritis   . Atypical chest pain 03/12/2019  . Fibromyalgia   . Hypertension   . Lupus (HCC)   . Migraines   . Neuropathy   . Obesity (BMI 30.0-34.9) 03/12/2019  . Palpitations 03/12/2019  . Vitamin D deficiency     Past Surgical History:  Procedure Laterality Date  . BARIATRIC SURGERY    . CESAREAN SECTION    . DILATION AND CURETTAGE, DIAGNOSTIC / THERAPEUTIC    . ESOPHAGOGASTRODUODENOSCOPY (EGD) WITH PROPOFOL N/A 08/02/2016   Procedure: ESOPHAGOGASTRODUODENOSCOPY (EGD) WITH PROPOFOL;  Surgeon: Scot Jun, MD;  Location: [facility] ENDOSCOPY;  Service: Endoscopy;  Laterality: N/A;  . HYSTEROSCOPY WITH NOVASURE N/A 08/21/2014   Procedure: HYSTEROSCOPY WITH NOVASURE;  Surgeon: [place] Bing, MD;  Location: [facility] ORS;  Service: Gynecology;  Laterality: N/A;  . TUBAL LIGATION      Current Medications: Current Meds  Medication Sig  . AMITRIPTYLINE HCL PO Take 10 mg by mouth 1 day or 1 dose. Takes as needed  . amLODipine (NORVASC) 5 MG tablet Take 1 tablet (5 mg total) by mouth 2 (two) times daily.  . carvedilol (COREG) 25 MG tablet Take 1 tablet (25 mg total) by mouth 2 (two) times daily.  Marland Kitchen gabapentin (NEURONTIN) 300 MG capsule Take 3 capsules (  900 mg total) by mouth at bedtime. (Patient taking differently: Take by mouth at bedtime. 2 capsules in a.m. 2 capsules in afternoon 3 capsules at bedtime)  . hydrALAZINE (APRESOLINE) 50 MG tablet Take 1 tablet (50 mg total) by mouth 3 (three) times daily.  . IRON PO Take 1 tablet by mouth 2 (two) times daily.  . Linaclotide (LINZESS) 290 MCG CAPS capsule Take 290 mcg by mouth daily.  Marland Kitchen lisinopril (ZESTRIL) 20 MG tablet Take 1 tablet (20 mg total) by mouth 2 (two) times daily.  . Magnesium 500 MG CAPS Take 1 capsule (500  mg total) by mouth 2 (two) times daily at 8 am and 10 pm.  . orphenadrine (NORFLEX) 100 MG tablet Take 1 tablet (100 mg total) by mouth 2 (two) times daily as needed for muscle spasms or mild pain.  Marland Kitchen oxyCODONE-acetaminophen (PERCOCET) 7.5-325 MG tablet Take 1 tablet by mouth every 4 (four) hours as needed for severe pain.  . potassium chloride (K-DUR,KLOR-CON) 10 MEQ tablet Take 10 mEq by mouth 2 (two) times daily.  . [DISCONTINUED] cloNIDine (CATAPRES - DOSED IN MG/24 HR) 0.1 mg/24hr patch Place 1 patch (0.1 mg total) onto the skin once a week.     Allergies:   Lac bovis, Milk-related compounds, Pravastatin, and Venlafaxine   Social History   Socioeconomic History  . Marital status: Single    Spouse name: Not on file  . Number of children: Not on file  . Years of education: Not on file  . Highest education level: Not on file  Occupational History  . Not on file  Tobacco Use  . Smoking status: Never Smoker  . Smokeless tobacco: Never Used  Substance and Sexual Activity  . Alcohol use: No  . Drug use: No  . Sexual activity: Never  Other Topics Concern  . Not on file  Social History Narrative  . Not on file   Social Determinants of Health   Financial Resource Strain:   . Difficulty of Paying Living Expenses:   Food Insecurity: No Food Insecurity  . Worried About Programme researcher, broadcasting/film/video in the Last Year: Never true  . Ran Out of Food in the Last Year: Never true  Transportation Needs: No Transportation Needs  . Lack of Transportation (Medical): No  . Lack of Transportation (Non-Medical): No  Physical Activity:   . Days of Exercise per Week:   . Minutes of Exercise per Session:   Stress:   . Feeling of Stress :   Social Connections:   . Frequency of Communication with Friends and Family:   . Frequency of Social Gatherings with Friends and Family:   . Attends Religious Services:   . Active Member of Clubs or Organizations:   . Attends Banker Meetings:   Marland Kitchen  Marital Status:      Family History: The patient's family history includes Heart failure in her paternal aunt; Heart murmur in her maternal grandfather and mother; Hyperlipidemia in her father; Hypertension in her father and mother; Stroke in her maternal grandfather.  ROS:   Please see the history of present illness.     All other systems reviewed and are negative.  EKGs/Labs/Other Studies Reviewed:    The following studies were reviewed today:  Echocardiogram dated 10/31/2018  1. Left ventricular ejection fraction, by visual estimation, is 60 to 65%. The left ventricle has normal function. There is no left ventricular hypertrophy.  2. Left ventricular diastolic parameters are consistent with Grade I diastolic dysfunction (  impaired relaxation).  3. Global right ventricle has normal systolic function.The right ventricular size is normal. No increase in right ventricular wall thickness.  4. Left atrial size was normal.  5. Mild mitral valve regurgitation.  6. Tricuspid valve regurgitation is mild.  7. The aortic valve was not well visualized, Unable to exclude bicuspid aortic valve.  8. Normal pulmonary pressure.  Pharmacological MPI study 10/10/2018 Pharmacological myocardial perfusion imaging study with no significant  ischemia Normal wall motion, EF estimated at 67% No EKG changes concerning for ischemia at peak stress or in recovery. Low risk scan  EKG:  EKG is  ordered today.  The ekg ordered today demonstrates normal sinus rhythm  Recent Labs: 11/11/2018: BUN 15; Creatinine, Ser 0.96; Potassium 3.3; Sodium 138  Recent Lipid Panel No results found for: CHOL, TRIG, HDL, CHOLHDL, VLDL, LDLCALC, LDLDIRECT  Physical Exam:    VS:  BP (!) 156/88 (BP Location: Left Arm, Patient Position: Sitting, Cuff Size: Normal)   Pulse 88   Ht 5\' 11"  (1.803 m)   Wt 226 lb (102.5 kg)   SpO2 98%   BMI 31.52 kg/m     Wt Readings from Last 3 Encounters:  05/27/19 226 lb (102.5 kg)    02/27/19 219 lb (99.3 kg)  02/11/19 222 lb 8 oz (100.9 kg)     GEN:  Well nourished, well developed in no acute distress HEENT: Normal NECK: No JVD; No carotid bruits LYMPHATICS: No lymphadenopathy CARDIAC: RRR, 1/6 systolic murmur at the right upper sternal border.  No rubs or gallops. RESPIRATORY:  Clear to auscultation without rales, wheezing or rhonchi  ABDOMEN: Soft, non-tender, non-distended MUSCULOSKELETAL: Patient noted to have hypermobile joints. SKIN: Warm and dry NEUROLOGIC:  Alert and oriented x 3 PSYCHIATRIC:  Normal affect   ASSESSMENT:     1. Uncontrolled hypertension 2. Hypermobility joints  PLAN:    1. Patient with very difficult to control blood pressures.  Blood pressure appears better controlled but still elevated.  Work-up for secondary causes have been unrevealing.   Increase clonidine transdermal patch to 0.2 mg / 24-hour every week.  Continue amlodipine , continue Coreg 25 mg twice daily, spironolactone 25 mg nightly, hydralazine 50 mg 3 times daily.    Keep follow-up appointments with the hypertension clinic.  2.  Patient with hypermobile joints.  Has appointment with a [facility] affiliated specialist next month in [place].    Follow-up in 3 months.  This note was generated in part or whole with voice recognition software. Voice recognition is usually quite accurate but there are transcription errors that can and very often do occur. I apologize for any typographical errors that were not detected and corrected.  Medication Adjustments/Labs and Tests Ordered: Current medicines are reviewed at length with the patient today.  Concerns regarding medicines are outlined above.  Orders Placed This Encounter  Procedures  . EKG 12-Lead   Meds ordered this encounter  Medications  . DISCONTD: cloNIDine (CATAPRES - DOSED IN MG/24 HR) 0.2 mg/24hr patch    Sig: Place 1 patch (0.2 mg total) onto the skin once a week.    Dispense:  4 patch    Refill:  5  .  cloNIDine (CATAPRES - DOSED IN MG/24 HR) 0.2 mg/24hr patch    Sig: Place 1 patch (0.2 mg total) onto the skin once a week.    Dispense:  4 patch    Refill:  5    Patient Instructions  Medication Instructions:  Your physician has recommended you make  the following change in your medication:   Increase your Clonidine patch to 0.2mg , wear 24 hours a day and change every 7 days.  *If you need a refill on your cardiac medications before your next appointment, please call your pharmacy*   Lab Work: None Ordered. If you have labs (blood work) drawn today and your tests are completely normal, you will receive your results only by: Marland Kitchen MyChart Message (if you have MyChart) OR . A paper copy in the mail If you have any lab test that is abnormal or we need to change your treatment, we will call you to review the results.   Testing/Procedures: None Ordered.   Follow-Up: At [facility], you and your health needs are our priority.  As part of our continuing mission to provide you with exceptional heart care, we have created designated Provider Care Teams.  These Care Teams include your primary Cardiologist (physician) and Advanced Practice Providers (APPs -  Physician Assistants and Nurse Practitioners) who all work together to provide you with the care you need, when you need it.  We recommend signing up for the patient portal called "MyChart".  Sign up information is provided on this After Visit Summary.  MyChart is used to connect with patients for Virtual Visits (Telemedicine).  Patients are able to view lab/test results, encounter notes, upcoming appointments, etc.  Non-urgent messages can be sent to your provider as well.   To learn more about what you can do with MyChart, go to [email or web address].    Your next appointment:   3 month(s)  The format for your next appointment:   In Person  Provider:   Debbe Odea, MD   Other Instructions N/A    Signed, Debbe Odea, MD  05/27/2019 10:27 AM    [facility]

## 2019-06-26 ENCOUNTER — Other Ambulatory Visit: Payer: Self-pay | Admitting: Cardiology

## 2019-06-26 DIAGNOSIS — I10 Essential (primary) hypertension: Secondary | ICD-10-CM

## 2019-06-26 MED ORDER — LISINOPRIL 20 MG PO TABS: 20.0000 mg | ORAL_TABLET | Freq: Two times a day (BID) | ORAL | 0 refills | Status: DC

## 2019-06-26 NOTE — Telephone Encounter (Signed)
Requested Prescriptions   Signed Prescriptions Disp Refills  . lisinopril (ZESTRIL) 20 MG tablet 180 tablet 0    Sig: Take 1 tablet (20 mg total) by mouth 2 (two) times daily.    Authorizing Provider: Debbe Odea    Ordering User: Kendrick Fries

## 2019-06-26 NOTE — Telephone Encounter (Signed)
°*  STAT* If patient is at the pharmacy, call can be transferred to refill team.   1. Which medications need to be refilled? (please list name of each medication and dose if known)  Lisinopril 20 MG 1 tablet 2 times daily   2. Which pharmacy/location (including street and city if local pharmacy) is medication to be sent to? [facility] pharmacy   3. Do they need a 30 day or 90 day supply?  90 day

## 2019-08-29 ENCOUNTER — Other Ambulatory Visit: Payer: Self-pay

## 2019-08-29 ENCOUNTER — Ambulatory Visit (INDEPENDENT_AMBULATORY_CARE_PROVIDER_SITE_OTHER): Payer: Medicaid Other | Admitting: Cardiology

## 2019-08-29 ENCOUNTER — Encounter: Payer: Self-pay | Admitting: Cardiology

## 2019-08-29 VITALS — BP 148/102 | HR 71 | Ht 71.0 in | Wt 223.4 lb

## 2019-08-29 DIAGNOSIS — M249 Joint derangement, unspecified: Secondary | ICD-10-CM | POA: Diagnosis not present

## 2019-08-29 DIAGNOSIS — I10 Essential (primary) hypertension: Secondary | ICD-10-CM | POA: Diagnosis not present

## 2019-08-29 MED ORDER — CLONIDINE 0.3 MG/24HR TD PTWK
0.3000 mg | MEDICATED_PATCH | TRANSDERMAL | 5 refills | Status: DC
Start: 2019-08-29 — End: 2019-08-29

## 2019-08-29 MED ORDER — CLONIDINE 0.3 MG/24HR TD PTWK: 0.3000 mg | MEDICATED_PATCH | TRANSDERMAL | 5 refills | Status: DC

## 2019-08-29 NOTE — Progress Notes (Signed)
Cardiology Office Note:    Date:  08/29/2019   ID:  [name], DOB 10/10/1980, MRN [id number]  PCP:  [facility]  Cardiologist:  Debbe Odea, MD  Electrophysiologist:  None   Referring MD: [facility]*   Chief Complaint  Patient presents with  . Follow-up    3 Months follow up. Medications verbally reviewed with patient.     History of Present Illness:    Connie Whitney is a 39 y.o. female with a hx of fibromyalgia, hypertension, arthritis, joint hypermobility, who presents for follow-up.  She is being seen due to difficult to control blood pressures.  She is compliant with all her medications.  BP was elevated after last visit.  Clonidine patch was increased to 0.2mg /24hr. she had episodes of hypotension with systolic in the 70s causing her to pass out.  Hydralazine was stopped and these episodes have since improved.  Her current BP range is about level of 100 systolic to 170s systolic.  prior echocardiogram and stress test were normal.  She otherwise feels okay, has no new concerns at this time.  Scheduled to see musculoskeletal specialist in about 2 weeks.   Prior notes BiDil was stopped after patient states her blood pressure bottomed out after taking BiDil.  Systolic blood pressure typically in the 90s and slowly go up throughout the day.  She states being on HCTZ before but that was also stopped likely due to low potassium levels.   She currently takes Coreg 25 mg twice daily,  lisinopril 20 mg daily, hydralazine 25 mg twice daily, Norvasc 5 mg daily.  Aldactone was started, but stopped in order to obtain renin/aldosterone levels.  She states her blood pressure is usually normal in the mornings but creeps up towards the evening to about 160s 170s systolic. She also has a history of hypermobile joints with lumbar vertebrae displacement.  Has been seeing pain management for the past 5 years.  She is concerned she may have Marfan's.     Work-up for secondary causes including renal artery ultrasound, ringing aldosterone studies, metanephrine studies, CT abdomen to evaluate adrenal mass have all been unremarkable.  Past Medical History:  Diagnosis Date  . Anemia   . Arthritis   . Atypical chest pain 03/12/2019  . Fibromyalgia   . Hypertension   . Lupus (HCC)   . Migraines   . Neuropathy   . Obesity (BMI 30.0-34.9) 03/12/2019  . Palpitations 03/12/2019  . Vitamin D deficiency     Past Surgical History:  Procedure Laterality Date  . BARIATRIC SURGERY    . CESAREAN SECTION    . DILATION AND CURETTAGE, DIAGNOSTIC / THERAPEUTIC    . ESOPHAGOGASTRODUODENOSCOPY (EGD) WITH PROPOFOL N/A 08/02/2016   Procedure: ESOPHAGOGASTRODUODENOSCOPY (EGD) WITH PROPOFOL;  Surgeon: Scot Jun, MD;  Location: [facility]COPY;  Service: Endoscopy;  Laterality: N/A;  . HYSTEROSCOPY WITH NOVASURE N/A 08/21/2014   Procedure: HYSTEROSCOPY WITH NOVASURE;  Surgeon: Lamont Bing, MD;  Location: [facility] ORS;  Service: Gynecology;  Laterality: N/A;  . TUBAL LIGATION      Current Medications: Current Meds  Medication Sig  . AMITIZA 24 MCG capsule Take 24 mcg by mouth 2 (two) times daily.  Marland Kitchen AMITRIPTYLINE HCL PO Take 10 mg by mouth 1 day or 1 dose. Takes as needed  . amLODipine (NORVASC) 5 MG tablet Take 1 tablet (5 mg total) by mouth 2 (two) times daily.  . carvedilol (COREG) 25 MG tablet Take 1 tablet (25 mg total) by mouth  2 (two) times daily.  . cloNIDine (CATAPRES - DOSED IN MG/24 HR) 0.3 mg/24hr patch Place 1 patch (0.3 mg total) onto the skin once a week.  . cyclobenzaprine (FLEXERIL) 10 MG tablet SMARTSIG:1 Tablet(s) By Mouth Every 12 Hours  . gabapentin (NEURONTIN) 300 MG capsule Take 3 capsules (900 mg total) by mouth at bedtime. (Patient taking differently: Take by mouth at bedtime. 2 capsules in a.m. 2 capsules in afternoon 3 capsules at bedtime)  . Linaclotide (LINZESS) 290 MCG CAPS capsule Take 290 mcg by mouth daily.  Marland Kitchen  lisinopril (ZESTRIL) 20 MG tablet Take 1 tablet (20 mg total) by mouth 2 (two) times daily.  . Magnesium 500 MG CAPS Take 1 capsule (500 mg total) by mouth 2 (two) times daily at 8 am and 10 pm.  . orphenadrine (NORFLEX) 100 MG tablet Take 1 tablet (100 mg total) by mouth 2 (two) times daily as needed for muscle spasms or mild pain.  Marland Kitchen oxyCODONE-acetaminophen (PERCOCET) 7.5-325 MG tablet Take 1 tablet by mouth every 4 (four) hours as needed for severe pain.  . potassium chloride (K-DUR,KLOR-CON) 10 MEQ tablet Take 10 mEq by mouth 2 (two) times daily.  . [DISCONTINUED] cloNIDine (CATAPRES - DOSED IN MG/24 HR) 0.2 mg/24hr patch Place 1 patch (0.2 mg total) onto the skin once a week.  . [DISCONTINUED] cloNIDine (CATAPRES - DOSED IN MG/24 HR) 0.3 mg/24hr patch Place 1 patch (0.3 mg total) onto the skin once a week.     Allergies:   Lac bovis, Milk-related compounds, Pravastatin, and Venlafaxine   Social History   Socioeconomic History  . Marital status: Single    Spouse name: Not on file  . Number of children: Not on file  . Years of education: Not on file  . Highest education level: Not on file  Occupational History  . Not on file  Tobacco Use  . Smoking status: Never Smoker  . Smokeless tobacco: Never Used  Vaping Use  . Vaping Use: Never used  Substance and Sexual Activity  . Alcohol use: No  . Drug use: No  . Sexual activity: Never  Other Topics Concern  . Not on file  Social History Narrative  . Not on file   Social Determinants of Health   Financial Resource Strain:   . Difficulty of Paying Living Expenses: Not on file  Food Insecurity: No Food Insecurity  . Worried About Programme researcher, broadcasting/film/video in the Last Year: Never true  . Ran Out of Food in the Last Year: Never true  Transportation Needs: No Transportation Needs  . Lack of Transportation (Medical): No  . Lack of Transportation (Non-Medical): No  Physical Activity:   . Days of Exercise per Week: Not on file  . Minutes  of Exercise per Session: Not on file  Stress:   . Feeling of Stress : Not on file  Social Connections:   . Frequency of Communication with Friends and Family: Not on file  . Frequency of Social Gatherings with Friends and Family: Not on file  . Attends Religious Services: Not on file  . Active Member of Clubs or Organizations: Not on file  . Attends Banker Meetings: Not on file  . Marital Status: Not on file     Family History: The patient's family history includes Heart failure in her paternal aunt; Heart murmur in her maternal grandfather and mother; Hyperlipidemia in her father; Hypertension in her father and mother; Stroke in her maternal grandfather.  ROS:  Please see the history of present illness.     All other systems reviewed and are negative.  EKGs/Labs/Other Studies Reviewed:    The following studies were reviewed today:  Echocardiogram dated 10/31/2018  1. Left ventricular ejection fraction, by visual estimation, is 60 to 65%. The left ventricle has normal function. There is no left ventricular hypertrophy.  2. Left ventricular diastolic parameters are consistent with Grade I diastolic dysfunction (impaired relaxation).  3. Global right ventricle has normal systolic function.The right ventricular size is normal. No increase in right ventricular wall thickness.  4. Left atrial size was normal.  5. Mild mitral valve regurgitation.  6. Tricuspid valve regurgitation is mild.  7. The aortic valve was not well visualized, Unable to exclude bicuspid aortic valve.  8. Normal pulmonary pressure.  Pharmacological MPI study 10/10/2018 Pharmacological myocardial perfusion imaging study with no significant  ischemia Normal wall motion, EF estimated at 67% No EKG changes concerning for ischemia at peak stress or in recovery. Low risk scan  EKG:  EKG is  ordered today.  The ekg ordered today demonstrates normal sinus rhythm  Recent Labs: 11/11/2018: BUN 15;  Creatinine, Ser 0.96; Potassium 3.3; Sodium 138  Recent Lipid Panel No results found for: CHOL, TRIG, HDL, CHOLHDL, VLDL, LDLCALC, LDLDIRECT  Physical Exam:    VS:  BP (!) 148/102 (BP Location: Left Arm, Patient Position: Sitting, Cuff Size: Normal)   Pulse 71   Ht 5\' 11"  (1.803 m)   Wt 223 lb 6.4 oz (101.3 kg)   SpO2 98%   BMI 31.16 kg/m     Wt Readings from Last 3 Encounters:  08/29/19 223 lb 6.4 oz (101.3 kg)  05/27/19 226 lb (102.5 kg)  02/27/19 219 lb (99.3 kg)     GEN:  Well nourished, well developed in no acute distress HEENT: Normal NECK: No JVD; No carotid bruits LYMPHATICS: No lymphadenopathy CARDIAC: RRR, 1/6 systolic murmur at the right upper sternal border.  No rubs or gallops. RESPIRATORY:  Clear to auscultation without rales, wheezing or rhonchi  ABDOMEN: Soft, non-tender, non-distended MUSCULOSKELETAL: Patient noted to have hypermobile joints. SKIN: Warm and dry NEUROLOGIC:  Alert and oriented x 3 PSYCHIATRIC:  Normal affect   ASSESSMENT:     1. Uncontrolled hypertension 2. Hypermobility joints  PLAN:    1. Patient with very difficult to control blood pressures.  Blood pressure  better controlled but still elevated.  Work-up for secondary causes have been unrevealing.   Increase clonidine transdermal patch to 0.3 mg / 24-hour every week.  Continue amlodipine , continue Coreg 25 mg twice daily, spironolactone 25 mg nightly.    Keep follow-up appointments with the hypertension clinic (in about 3 weeks).  2.  Patient with hypermobile joints.  Keep appointment with specialist next month in 03/01/19.    Follow-up in 3 months.  This note was generated in part or whole with voice recognition software. Voice recognition is usually quite accurate but there are transcription errors that can and very often do occur. I apologize for any typographical errors that were not detected and corrected.  Medication Adjustments/Labs and Tests Ordered: Current medicines  are reviewed at length with the patient today.  Concerns regarding medicines are outlined above.  Orders Placed This Encounter  Procedures  . EKG 12-Lead   Meds ordered this encounter  Medications  . DISCONTD: cloNIDine (CATAPRES - DOSED IN MG/24 HR) 0.3 mg/24hr patch    Sig: Place 1 patch (0.3 mg total) onto the skin once a week.  Dispense:  4 patch    Refill:  5  . cloNIDine (CATAPRES - DOSED IN MG/24 HR) 0.3 mg/24hr patch    Sig: Place 1 patch (0.3 mg total) onto the skin once a week.    Dispense:  4 patch    Refill:  5    Patient Instructions  Medication Instructions:   Your physician has recommended you make the following change in your medication:   INCREASE your cloNIDine (CATAPRES - DOSED IN MG/24 HR) 0.3 mg/24hr patch: Place 1 patch (0.3 mg total) onto the skin once a week  *If you need a refill on your cardiac medications before your next appointment, please call your pharmacy*   Lab Work: None Ordered If you have labs (blood work) drawn today and your tests are completely normal, you will receive your results only by: Marland Kitchen. MyChart Message (if you have MyChart) OR . A paper copy in the mail If you have any lab test that is abnormal or we need to change your treatment, we will call you to review the results.   Testing/Procedures: None Ordered   Follow-Up: At [facility], you and your health needs are our priority.  As part of our continuing mission to provide you with exceptional heart care, we have created designated Provider Care Teams.  These Care Teams include your primary Cardiologist (physician) and Advanced Practice Providers (APPs -  Physician Assistants and Nurse Practitioners) who all work together to provide you with the care you need, when you need it.  We recommend signing up for the patient portal called "MyChart".  Sign up information is provided on this After Visit Summary.  MyChart is used to connect with patients for Virtual Visits  (Telemedicine).  Patients are able to view lab/test results, encounter notes, upcoming appointments, etc.  Non-urgent messages can be sent to your provider as well.   To learn more about what you can do with MyChart, go to [email or web address].    Your next appointment:   3 month(s)  The format for your next appointment:   In Person  Provider:   Debbe OdeaBrian Agbor-Etang, MD   Other Instructions      Signed, Debbe OdeaBrian Agbor-Etang, MD  08/29/2019 11:52 AM    [facility]

## 2019-08-29 NOTE — Patient Instructions (Signed)
Medication Instructions:   Your physician has recommended you make the following change in your medication:   INCREASE your cloNIDine (CATAPRES - DOSED IN MG/24 HR) 0.3 mg/24hr patch: Place 1 patch (0.3 mg total) onto the skin once a week  *If you need a refill on your cardiac medications before your next appointment, please call your pharmacy*   Lab Work: None Ordered If you have labs (blood work) drawn today and your tests are completely normal, you will receive your results only by:  MyChart Message (if you have MyChart) OR  A paper copy in the mail If you have any lab test that is abnormal or we need to change your treatment, we will call you to review the results.   Testing/Procedures: None Ordered   Follow-Up: At [facility], you and your health needs are our priority.  As part of our continuing mission to provide you with exceptional heart care, we have created designated Provider Care Teams.  These Care Teams include your primary Cardiologist (physician) and Advanced Practice Providers (APPs -  Physician Assistants and Nurse Practitioners) who all work together to provide you with the care you need, when you need it.  We recommend signing up for the patient portal called "MyChart".  Sign up information is provided on this After Visit Summary.  MyChart is used to connect with patients for Virtual Visits (Telemedicine).  Patients are able to view lab/test results, encounter notes, upcoming appointments, etc.  Non-urgent messages can be sent to your provider as well.   To learn more about what you can do with MyChart, go to [email or web address].    Your next appointment:   3 month(s)  The format for your next appointment:   In Person  Provider:   Debbe Odea, MD   Other Instructions

## 2019-09-22 ENCOUNTER — Ambulatory Visit (INDEPENDENT_AMBULATORY_CARE_PROVIDER_SITE_OTHER): Payer: Medicaid Other | Admitting: Cardiovascular Disease

## 2019-09-22 ENCOUNTER — Other Ambulatory Visit: Payer: Self-pay

## 2019-09-22 ENCOUNTER — Encounter: Payer: Self-pay | Admitting: Cardiovascular Disease

## 2019-09-22 VITALS — BP 132/82 | HR 79 | Ht 70.0 in | Wt 228.0 lb

## 2019-09-22 DIAGNOSIS — I10 Essential (primary) hypertension: Secondary | ICD-10-CM | POA: Diagnosis not present

## 2019-09-22 DIAGNOSIS — R002 Palpitations: Secondary | ICD-10-CM

## 2019-09-22 NOTE — Progress Notes (Signed)
Hypertension Clinic Follow-Up:    Date:  09/22/2019   ID:  Connie Whitney, DOB 12/11/1980, MRN [id number]  PCP:  [facility]  Cardiologist:  Debbe OdeaBrian Agbor-Etang, MD  Nephrologist:  Referring MD: [facility]   CC: Hypertension  History of Present Illness:    Connie Whitney is a 39 y.o. female with a hx of hypertension, fibromyalgia and arthritis here to establish care in the hypertension clinic.  She reports that she was initially diagnosed with hypertension 10 years ago.   Her blood pressure has never been very well-controlled.  She first saw Dr. Azucena CecilAgbor-Etang for L sided chest pain on 09/2018.  Her chest pain was atypical and non-exertional.  She was seen in the ED and cardiac enzymes were negative.  [facility] that was negative for ischemia.  She followed up 11/2018 and reported flushing and headaches.  Therefore plasma metanephrines were checked and were within normal limits.  Spironolactone was added to her regimen without improvement in her blood pressure or symptoms.  He also increased lisinopril to 20mg  and stopped Bidil.  Bidil caused hypotension to 90s systolic.  At the next appointment spironolactone was stopped and she was started on hydralazine.  At her last appointment 2/9 spironolactone was restarted and amlodipine was increased.  Since then she has not noted any change in her BP.  She had an adrenal CT 12/2018 that was unremarkable.  Labs were negative for hyperaldosteronism.  Renal artery Dopplers at that time were also within normal limits.  On 2/15 her BP became elevated to 194 mmHg.  She had an echo 10/2018 that revealed LVEF 60 to 65% and grade 1 diastolic dysfunction.  There was no LVH.  Renal artery Dopplers were normal 12/2018.  Renin, aldosterone and metanephrines were unremarkable.  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  She had gastric bypass surgery two years ago and lost 70lb.  However, her BP remained elevated.  She  follows a healthy diet, meal preps, limits salt and drinks a lot of water. She avoids caffeine and sugary beverages.  She denies EtOH or tobacco abuse.    At her initial appointment with hypertension clinic she was started on a clonidine patch and hydralazine was increased.  She did not show up for any of her follow-up appointments with our pharmacist.  She saw Dr. Bethena RoysArgbor-Etang on 05/2019 and the clonidine patch was increased to 0.2mg /week.  She then developed episodes of hypotension with a systolic pressure in the 90s and syncope.  Hydralazine was discontinued.  She followed up with Dr. Bethena RoysArgbor Etang on 08/2019 and BP was 148/102.  Clonidine was increased to 0.3mg .  Hydralazine was discontinued due to syncope.  Lately her blood pressure has been averaging around 130/85.  The highest it has been in the 140/90.  She continues to stay active and spends a lot of hours at work.  She is eating healthy.  [facility] and possible lupus.  It was thought that her symptoms were due to joint hypermobility rather than autoimmune disease.  They recommended physical therapy and supportive measures.   Previous antihypertensives: BiDil- BP too low HCTZ hypokalemia Spironolactone- held for testing Metoprolol didn't work  Past Medical History:  Diagnosis Date  . Anemia   . Arthritis   . Atypical chest pain 03/12/2019  . Fibromyalgia   . Hypertension   . Lupus (HCC)   . Migraines   . Neuropathy   .  Obesity (BMI 30.0-34.9) 03/12/2019  . Palpitations 03/12/2019  . Vitamin D deficiency     Past Surgical History:  Procedure Laterality Date  . BARIATRIC SURGERY    . CESAREAN SECTION    . DILATION AND CURETTAGE, DIAGNOSTIC / THERAPEUTIC    . ESOPHAGOGASTRODUODENOSCOPY (EGD) WITH PROPOFOL N/A 08/02/2016   Procedure: ESOPHAGOGASTRODUODENOSCOPY (EGD) WITH PROPOFOL;  Surgeon: Scot Jun, MD;  Location: [facility] ENDOSCOPY;  Service: Endoscopy;  Laterality: N/A;  .  HYSTEROSCOPY WITH NOVASURE N/A 08/21/2014   Procedure: HYSTEROSCOPY WITH NOVASURE;  Surgeon: Charlton Bing, MD;  Location: [facility] ORS;  Service: Gynecology;  Laterality: N/A;  . TUBAL LIGATION      Current Medications: Current Meds  Medication Sig  . AMITIZA 24 MCG capsule Take 24 mcg by mouth 2 (two) times daily.  Marland Kitchen AMITRIPTYLINE HCL PO Take 10 mg by mouth 1 day or 1 dose. Takes as needed  . amLODipine (NORVASC) 5 MG tablet Take 1 tablet (5 mg total) by mouth 2 (two) times daily.  . carvedilol (COREG) 25 MG tablet Take 1 tablet (25 mg total) by mouth 2 (two) times daily.  . cloNIDine (CATAPRES - DOSED IN MG/24 HR) 0.3 mg/24hr patch Place 1 patch (0.3 mg total) onto the skin once a week.  . cyclobenzaprine (FLEXERIL) 10 MG tablet SMARTSIG:1 Tablet(s) By Mouth Every 12 Hours  . gabapentin (NEURONTIN) 300 MG capsule Take 3 capsules (900 mg total) by mouth at bedtime. (Patient taking differently: Take by mouth at bedtime. 2 capsules in a.m. 2 capsules in afternoon 3 capsules at bedtime)  . Linaclotide (LINZESS) 290 MCG CAPS capsule Take 290 mcg by mouth daily.  Marland Kitchen lisinopril (ZESTRIL) 20 MG tablet Take 1 tablet (20 mg total) by mouth 2 (two) times daily.  . Magnesium 500 MG CAPS Take 1 capsule (500 mg total) by mouth 2 (two) times daily at 8 am and 10 pm.  . orphenadrine (NORFLEX) 100 MG tablet Take 1 tablet (100 mg total) by mouth 2 (two) times daily as needed for muscle spasms or mild pain.  Marland Kitchen oxyCODONE-acetaminophen (PERCOCET) 7.5-325 MG tablet Take 1 tablet by mouth every 4 (four) hours as needed for severe pain.  . potassium chloride (K-DUR,KLOR-CON) 10 MEQ tablet Take 10 mEq by mouth 2 (two) times daily.     Allergies:   Lac bovis, Milk-related compounds, Pravastatin, and Venlafaxine   Social History   Socioeconomic History  . Marital status: Single    Spouse name: Not on file  . Number of children: Not on file  . Years of education: Not on file  . Highest education level: Not on  file  Occupational History  . Not on file  Tobacco Use  . Smoking status: Never Smoker  . Smokeless tobacco: Never Used  Vaping Use  . Vaping Use: Never used  Substance and Sexual Activity  . Alcohol use: No  . Drug use: No  . Sexual activity: Never  Other Topics Concern  . Not on file  Social History Narrative  . Not on file   Social Determinants of Health   Financial Resource Strain:   . Difficulty of Paying Living Expenses: Not on file  Food Insecurity: No Food Insecurity  . Worried About Programme researcher, broadcasting/film/video in the Last Year: Never true  . Ran Out of Food in the Last Year: Never true  Transportation Needs: No Transportation Needs  . Lack of Transportation (Medical): No  . Lack of Transportation (Non-Medical): No  Physical Activity:   .  Days of Exercise per Week: Not on file  . Minutes of Exercise per Session: Not on file  Stress:   . Feeling of Stress : Not on file  Social Connections:   . Frequency of Communication with Friends and Family: Not on file  . Frequency of Social Gatherings with Friends and Family: Not on file  . Attends Religious Services: Not on file  . Active Member of Clubs or Organizations: Not on file  . Attends Banker Meetings: Not on file  . Marital Status: Not on file     Family History: The patient's family history includes Heart failure in her paternal aunt; Heart murmur in her maternal grandfather and mother; Hyperlipidemia in her father; Hypertension in her father and mother; Stroke in her maternal grandfather.  ROS:   Please see the history of present illness.    All other systems reviewed and are negative.  EKGs/Labs/Other Studies Reviewed:    EKG:  EKG is not ordered today.    Recent Labs: 11/11/2018: BUN 15; Creatinine, Ser 0.96; Potassium 3.3; Sodium 138   Recent Lipid Panel No results found for: CHOL, TRIG, HDL, CHOLHDL, VLDL, LDLCALC, LDLDIRECT   Renal artery Dopplers 12/2018: Normal  Echo  10/31/18: IMPRESSIONS   1. Left ventricular ejection fraction, by visual estimation, is 60 to  65%. The left ventricle has normal function. There is no left ventricular  hypertrophy.  2. Left ventricular diastolic parameters are consistent with Grade I  diastolic dysfunction (impaired relaxation).  3. Global right ventricle has normal systolic function.The right  ventricular size is normal. No increase in right ventricular wall  thickness.  4. Left atrial size was normal.  5. Mild mitral valve regurgitation.  6. Tricuspid valve regurgitation is mild.  7. The aortic valve was not well visualized, Unable to exclude bicuspid  aortic valve.  8. Mildly elevated pulmonary artery systolic pressure.    Physical Exam:    VS:  BP 132/82   Pulse 79   Ht 5\' 10"  (1.778 m)   Wt 228 lb (103.4 kg)   SpO2 93%   BMI 32.71 kg/m  , BMI Body mass index is 32.71 kg/m. GENERAL:  Well appearing HEENT: Pupils equal round and reactive, fundi not visualized, oral mucosa unremarkable NECK:  No jugular venous distention, waveform within normal limits, carotid upstroke brisk and symmetric, no bruits, no thyromegaly LYMPHATICS:  No cervical adenopathy LUNGS:  Clear to auscultation bilaterally HEART:  RRR.  PMI not displaced or sustained,S1 and S2 within normal limits, no S3, no S4, no clicks, no rubs, no murmurs ABD:  Flat, positive bowel sounds normal in frequency in pitch, no bruits, no rebound, no guarding, no midline pulsatile mass, no hepatomegaly, no splenomegaly EXT:  2 plus pulses throughout, no edema, no cyanosis no clubbing SKIN:  No rashes no nodules NEURO:  Cranial nerves II through XII grossly intact, motor grossly intact throughout PSYCH:  Cognitively intact, oriented to person place and time   ASSESSMENT:    1. Essential hypertension   2. Palpitations     PLAN:    # Resistant hypertension:  BP is much better controlled.  She is tolerating clonidine well.  Hydralazine was  discontinued due to hypotension.  Continue amlodipine, carvedilol, lisinopril, and spironolactone.  Secondary work-up has been negative.  Continue with diet and exercise.    # Palpitations:  Resolved.  No arrhythmias seen on 30 day Event monitor.  # Obesity:  Patient was encouraged to continue with diet and exercise.  TSH was within normal limits.  Disposition:    FU with Temika C. Duke Salvia, MD, [facility] as needed.  Keep scheduled follow up with Dr. Azucena Cecil   Medication Adjustments/Labs and Tests Ordered: Current medicines are reviewed at length with the patient today.  Concerns regarding medicines are outlined above.  No orders of the defined types were placed in this encounter.  No orders of the defined types were placed in this encounter.    Signed, Chilton Si, MD  09/22/2019 2:38 PM    [facility]

## 2019-09-22 NOTE — Addendum Note (Signed)
Addended by: Regis Bill B on: 09/22/2019 02:44 PM   Modules accepted: Orders

## 2019-09-22 NOTE — Patient Instructions (Signed)
Medication Instructions:  ?Your physician recommends that you continue on your current medications as directed. Please refer to the Current Medication list given to you today.  ? ?Labwork: ?NONE ? ?Testing/Procedures: ?NONE ? ?Follow-Up: ?AS NEEDED  ? ?  ?

## 2019-10-23 ENCOUNTER — Other Ambulatory Visit: Payer: Self-pay | Admitting: Cardiology

## 2019-10-23 DIAGNOSIS — I10 Essential (primary) hypertension: Secondary | ICD-10-CM

## 2019-10-23 MED ORDER — LISINOPRIL 20 MG PO TABS: 20.0000 mg | ORAL_TABLET | Freq: Two times a day (BID) | ORAL | 0 refills | Status: DC

## 2019-10-23 NOTE — Telephone Encounter (Signed)
Requested Prescriptions   Signed Prescriptions Disp Refills   lisinopril (ZESTRIL) 20 MG tablet 180 tablet 0    Sig: Take 1 tablet (20 mg total) by mouth 2 (two) times daily.    Authorizing Provider: Debbe Odea    Ordering User: Thayer Headings, Cuinn Westerhold L

## 2019-10-23 NOTE — Telephone Encounter (Signed)
*  STAT* If patient is at the pharmacy, call can be transferred to refill team.   1. Which medications need to be refilled? (please list name of each medication and dose if known) lisinopril 20 MG 1 tablet 2 times daily   2. Which pharmacy/location (including street and city if local pharmacy) is medication to be sent to? [facility]   3. Do they need a 30 day or 90 day supply? 90 day

## 2019-12-09 ENCOUNTER — Ambulatory Visit (INDEPENDENT_AMBULATORY_CARE_PROVIDER_SITE_OTHER): Payer: Medicaid Other | Admitting: Cardiology

## 2019-12-09 ENCOUNTER — Other Ambulatory Visit: Payer: Self-pay

## 2019-12-09 ENCOUNTER — Encounter: Payer: Self-pay | Admitting: Cardiology

## 2019-12-09 VITALS — BP 110/74 | HR 69 | Ht 70.0 in | Wt 235.2 lb

## 2019-12-09 DIAGNOSIS — E669 Obesity, unspecified: Secondary | ICD-10-CM

## 2019-12-09 DIAGNOSIS — I10 Essential (primary) hypertension: Secondary | ICD-10-CM

## 2019-12-09 DIAGNOSIS — M249 Joint derangement, unspecified: Secondary | ICD-10-CM

## 2019-12-09 MED ORDER — AMLODIPINE BESYLATE 5 MG PO TABS: 2.5000 mg | ORAL_TABLET | Freq: Two times a day (BID) | ORAL | 1 refills | Status: DC

## 2019-12-09 NOTE — Progress Notes (Signed)
Cardiology Office Note:    Date:  12/09/2019   ID:  Connie Whitney, DOB 03-19-1980, MRN [id number]  PCP:  [facility]  Cardiologist:  Debbe Odea, MD  Electrophysiologist:  None   Referring MD: [facility]*   Chief Complaint  Patient presents with  . OTHER    3 month f/u c/o low BP. Meds reviewed verbally with pt.    History of Present Illness:    Connie Whitney is a 39 y.o. female with a hx of fibromyalgia, hypertension, arthritis, joint hypermobility, who presents for follow-up.   Patient being seen for difficult to control blood pressures.  Her clonidine patch was increased to 0.3 mg / 24-hour.  Blood pressures seem to have been better controlled.  Tolerating doses of Coreg, Norvasc, lisinopril, Aldactone.  She states her blood pressures have been better controlled, occasionally has a weird feeling coinciding with low BPs. Systolics run from as low as 70s to as high as low 100s. When her blood pressure goes low, she gets a little lightheaded.  Otherwise she is happy with how her blood pressure has been controlled.   Prior notes BiDil was stopped after patient states her blood pressure bottomed out after taking BiDil.  Systolic blood pressure typically in the 90s and slowly go up throughout the day.  She states being on HCTZ before but that was also stopped likely due to low potassium levels.  Hydralazine was previously stopped due to blood pressures becoming very low. She also has a history of hypermobile joints with lumbar vertebrae displacement.  Has been seeing pain management for the past 5 years.  She is concerned she may have Marfan's.   Work-up for secondary causes including renal artery ultrasound, renin aldosterone studies, metanephrine studies, CT abdomen to evaluate adrenal mass have all been unremarkable.  Past Medical History:  Diagnosis Date  . Anemia   . Arthritis   . Atypical chest pain 03/12/2019  . Fibromyalgia   .  Hypertension   . Lupus (HCC)   . Migraines   . Neuropathy   . Obesity (BMI 30.0-34.9) 03/12/2019  . Palpitations 03/12/2019  . Vitamin D deficiency     Past Surgical History:  Procedure Laterality Date  . BARIATRIC SURGERY    . CESAREAN SECTION    . DILATION AND CURETTAGE, DIAGNOSTIC / THERAPEUTIC    . ESOPHAGOGASTRODUODENOSCOPY (EGD) WITH PROPOFOL N/A 08/02/2016   Procedure: ESOPHAGOGASTRODUODENOSCOPY (EGD) WITH PROPOFOL;  Surgeon: Scot Jun, MD;  Location: [facility] ENDOSCOPY;  Service: Endoscopy;  Laterality: N/A;  . HYSTEROSCOPY WITH NOVASURE N/A 08/21/2014   Procedure: HYSTEROSCOPY WITH NOVASURE;  Surgeon: Ventana Bing, MD;  Location: [facility] ORS;  Service: Gynecology;  Laterality: N/A;  . TUBAL LIGATION      Current Medications: Current Meds  Medication Sig  . AMITIZA 24 MCG capsule Take 24 mcg by mouth 2 (two) times daily.  Marland Kitchen AMITRIPTYLINE HCL PO Take 10 mg by mouth 1 day or 1 dose. Takes as needed  . amLODipine (NORVASC) 5 MG tablet Take 0.5 tablets (2.5 mg total) by mouth 2 (two) times daily.  . carvedilol (COREG) 25 MG tablet Take 1 tablet (25 mg total) by mouth 2 (two) times daily.  . cloNIDine (CATAPRES - DOSED IN MG/24 HR) 0.3 mg/24hr patch Place 1 patch (0.3 mg total) onto the skin once a week.  . cyclobenzaprine (FLEXERIL) 10 MG tablet SMARTSIG:1 Tablet(s) By Mouth Every 12 Hours  . gabapentin (NEURONTIN) 300 MG capsule Take 3 capsules (900 mg total)  by mouth at bedtime. (Patient taking differently: Take by mouth at bedtime. 2 capsules in a.m. 2 capsules in afternoon 3 capsules at bedtime)  . lisinopril (ZESTRIL) 20 MG tablet Take 1 tablet (20 mg total) by mouth 2 (two) times daily.  . Magnesium 500 MG CAPS Take 1 capsule (500 mg total) by mouth 2 (two) times daily at 8 am and 10 pm.  . Multiple Vitamin (MULTIVITAMIN) capsule   . orphenadrine (NORFLEX) 100 MG tablet Take 1 tablet (100 mg total) by mouth 2 (two) times daily as needed for muscle spasms or mild pain.  Marland Kitchen  oxyCODONE-acetaminophen (PERCOCET) 7.5-325 MG tablet Take 1 tablet by mouth every 4 (four) hours as needed for severe pain.  . potassium chloride (K-DUR,KLOR-CON) 10 MEQ tablet Take 10 mEq by mouth 2 (two) times daily.  Marland Kitchen spironolactone (ALDACTONE) 25 MG tablet Take 1 tablet (25 mg total) by mouth at bedtime.  . [DISCONTINUED] amLODipine (NORVASC) 5 MG tablet Take 1 tablet (5 mg total) by mouth 2 (two) times daily.     Allergies:   Lac bovis, Milk-related compounds, Pravastatin, and Venlafaxine   Social History   Socioeconomic History  . Marital status: Single    Spouse name: Not on file  . Number of children: Not on file  . Years of education: Not on file  . Highest education level: Not on file  Occupational History  . Not on file  Tobacco Use  . Smoking status: Never Smoker  . Smokeless tobacco: Never Used  Vaping Use  . Vaping Use: Never used  Substance and Sexual Activity  . Alcohol use: No  . Drug use: No  . Sexual activity: Never  Other Topics Concern  . Not on file  Social History Narrative  . Not on file   Social Determinants of Health   Financial Resource Strain:   . Difficulty of Paying Living Expenses: Not on file  Food Insecurity: No Food Insecurity  . Worried About Programme researcher, broadcasting/film/video in the Last Year: Never true  . Ran Out of Food in the Last Year: Never true  Transportation Needs: No Transportation Needs  . Lack of Transportation (Medical): No  . Lack of Transportation (Non-Medical): No  Physical Activity:   . Days of Exercise per Week: Not on file  . Minutes of Exercise per Session: Not on file  Stress:   . Feeling of Stress : Not on file  Social Connections:   . Frequency of Communication with Friends and Family: Not on file  . Frequency of Social Gatherings with Friends and Family: Not on file  . Attends Religious Services: Not on file  . Active Member of Clubs or Organizations: Not on file  . Attends Banker Meetings: Not on file  .  Marital Status: Not on file     Family History: The patient's family history includes Heart failure in her paternal aunt; Heart murmur in her maternal grandfather and mother; Hyperlipidemia in her father; Hypertension in her father and mother; Stroke in her maternal grandfather.  ROS:   Please see the history of present illness.     All other systems reviewed and are negative.  EKGs/Labs/Other Studies Reviewed:    The following studies were reviewed today:   EKG:  EKG is  ordered today.  The ekg ordered today demonstrates normal sinus rhythm  Recent Labs: No results found for requested labs within last 8760 hours.  Recent Lipid Panel No results found for: CHOL, TRIG, HDL, CHOLHDL,  VLDL, LDLCALC, LDLDIRECT  Physical Exam:    VS:  BP 110/74 (BP Location: Left Arm, Patient Position: Sitting, Cuff Size: Large)   Pulse 69   Ht 5\' 10"  (1.778 m)   Wt 235 lb 4 oz (106.7 kg)   SpO2 98%   BMI 33.75 kg/m     Wt Readings from Last 3 Encounters:  12/09/19 235 lb 4 oz (106.7 kg)  09/22/19 228 lb (103.4 kg)  08/29/19 223 lb 6.4 oz (101.3 kg)     GEN:  Well nourished, well developed in no acute distress HEENT: Normal NECK: No JVD; No carotid bruits LYMPHATICS: No lymphadenopathy CARDIAC: RRR, 1/6 systolic murmur at the right upper sternal border.  No rubs or gallops. RESPIRATORY:  Clear to auscultation without rales, wheezing or rhonchi  ABDOMEN: Soft, non-tender, non-distended MUSCULOSKELETAL: Patient noted to have hypermobile joints. SKIN: Warm and dry NEUROLOGIC:  Alert and oriented x 3 PSYCHIATRIC:  Normal affect    ASSESSMENT:    1. Hypertension 2. Obesity  PLAN:    1. Hypertension. Work-up for secondary causes have been unrevealing.  Blood pressures better controlled although occasional low values noted associated with dizziness.. Continue clonidine transdermal patch 0.3 mg / 24-hour every week. Decrease amlodipine to 2.5 mg twice daily, continue Coreg 25 mg twice  daily, spironolactone 25 mg nightly, lisinopril 20mg  bid .  2. Obesity, low-calorie diet, exercise recommended.  Follow-up in 3 months  This note was generated in part or whole with voice recognition software. Voice recognition is usually quite accurate but there are transcription errors that can and very often do occur. I apologize for any typographical errors that were not detected and corrected.  Medication Adjustments/Labs and Tests Ordered: Current medicines are reviewed at length with the patient today.  Concerns regarding medicines are outlined above.  Orders Placed This Encounter  Procedures  . EKG 12-Lead   Meds ordered this encounter  Medications  . amLODipine (NORVASC) 5 MG tablet    Sig: Take 0.5 tablets (2.5 mg total) by mouth 2 (two) times daily.    Dispense:  180 tablet    Refill:  1    Patient Instructions  Medication Instructions:  Your physician has recommended you make the following change in your medication:   DECREASE your Norvasc to 2.5 MG daily.  *If you need a refill on your cardiac medications before your next appointment, please call your pharmacy*   Lab Work: None Ordered If you have labs (blood work) drawn today and your tests are completely normal, you will receive your results only by: 08/31/19 MyChart Message (if you have MyChart) OR . A paper copy in the mail If you have any lab test that is abnormal or we need to change your treatment, we will call you to review the results.   Testing/Procedures: None Ordered   Follow-Up: At [facility], you and your health needs are our priority.  As part of our continuing mission to provide you with exceptional heart care, we have created designated Provider Care Teams.  These Care Teams include your primary Cardiologist (physician) and Advanced Practice Providers (APPs -  Physician Assistants and Nurse Practitioners) who all work together to provide you with the care you need, when you need it.  We  recommend signing up for the patient portal called "MyChart".  Sign up information is provided on this After Visit Summary.  MyChart is used to connect with patients for Virtual Visits (Telemedicine).  Patients are able to view lab/test results, encounter  notes, upcoming appointments, etc.  Non-urgent messages can be sent to your provider as well.   To learn more about what you can do with MyChart, go to [email or web address].    Your next appointment:   3 month(s)  The format for your next appointment:   In Person  Provider:   Debbe OdeaBrian Agbor-Etang, MD   Other Instructions      Signed, Debbe OdeaBrian Agbor-Etang, MD  12/09/2019 11:26 AM    [facility]

## 2019-12-09 NOTE — Patient Instructions (Signed)
Medication Instructions:  Your physician has recommended you make the following change in your medication:   DECREASE your Norvasc to 2.5 MG daily.  *If you need a refill on your cardiac medications before your next appointment, please call your pharmacy*   Lab Work: None Ordered If you have labs (blood work) drawn today and your tests are completely normal, you will receive your results only by: Marland Kitchen MyChart Message (if you have MyChart) OR . A paper copy in the mail If you have any lab test that is abnormal or we need to change your treatment, we will call you to review the results.   Testing/Procedures: None Ordered   Follow-Up: At [facility], you and your health needs are our priority.  As part of our continuing mission to provide you with exceptional heart care, we have created designated Provider Care Teams.  These Care Teams include your primary Cardiologist (physician) and Advanced Practice Providers (APPs -  Physician Assistants and Nurse Practitioners) who all work together to provide you with the care you need, when you need it.  We recommend signing up for the patient portal called "MyChart".  Sign up information is provided on this After Visit Summary.  MyChart is used to connect with patients for Virtual Visits (Telemedicine).  Patients are able to view lab/test results, encounter notes, upcoming appointments, etc.  Non-urgent messages can be sent to your provider as well.   To learn more about what you can do with MyChart, go to [email or web address].    Your next appointment:   3 month(s)  The format for your next appointment:   In Person  Provider:   Debbe Odea, MD   Other Instructions

## 2020-01-11 IMAGING — MR MRI LUMBAR SPINE WITHOUT CONTRAST
6 of 7 series · 32 of 48 positions shown · non-contrast
Comparison: None.

CLINICAL DATA: Low back pain for over 6 weeks. Extends into the
right hip.

EXAM:
MRI LUMBAR SPINE WITHOUT CONTRAST
TECHNIQUE: Multiplanar, multisequence MR imaging of the lumbar spine was
performed. No intravenous contrast was administered.

[Series 5: T2 · sagittal · 4.0mm · 0.81mm/px · 4 of 17 slices shown (1 of 3)]
[im 1/17]
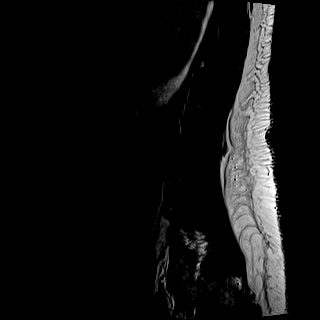
[im 6/17]
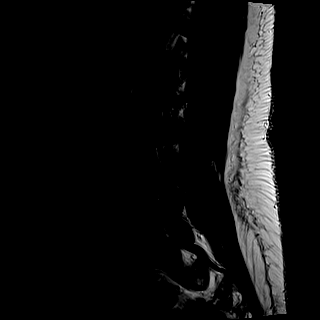
[im 11/17]
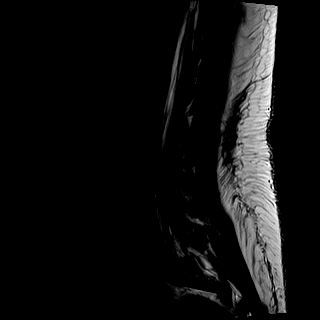
[im 17/17]
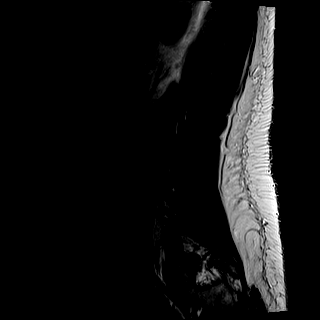

[Series 6: T1 · sagittal · 4.0mm · 0.81mm/px · 5 of 17 slices shown (1 of 2)]
[im 1/17]
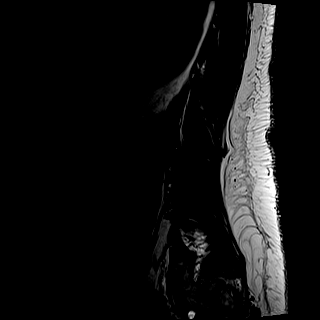
[im 5/17]
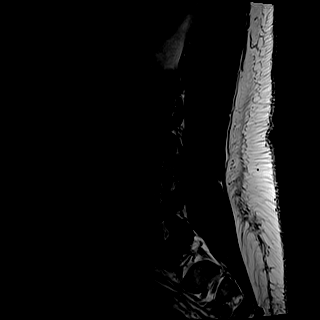
[im 9/17]
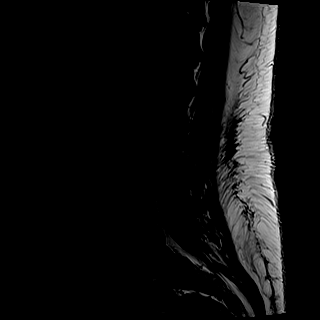
[im 13/17]
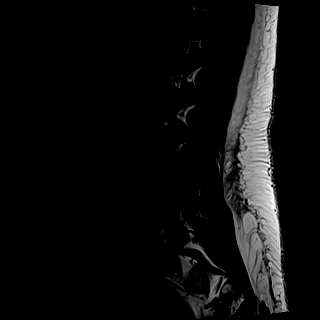
[im 17/17]
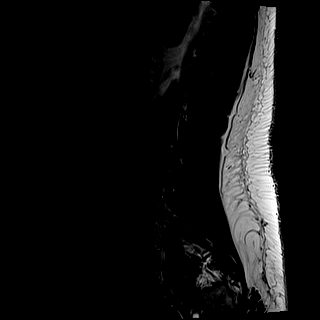

[Series 7: STIR · sagittal · 4.0mm · 0.41mm/px · 1 of 17 slices shown]
[im 1/17]
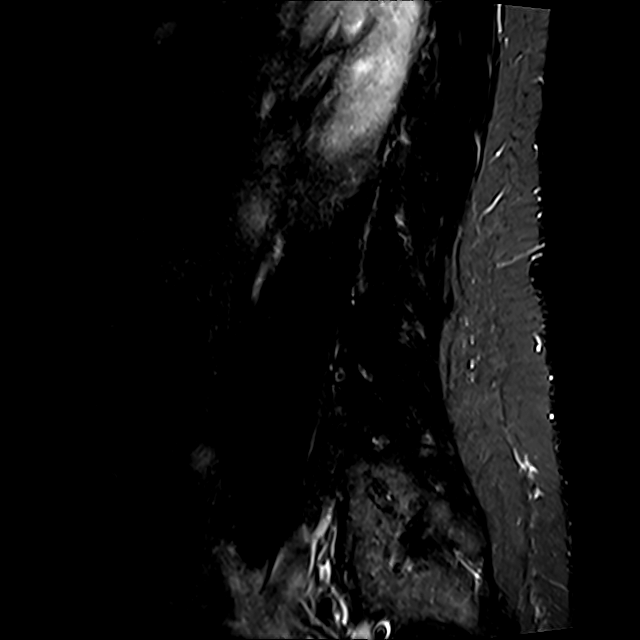

[Series 8: T2 · axial · 4.0mm · 0.78mm/px · z∈[-84,+157]mm · 9 of 41 slices shown (2 of 3)]
[im 1/41]
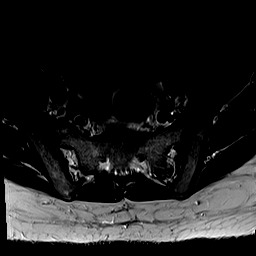
[im 8/41]
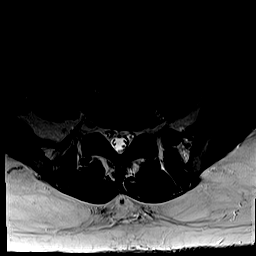
[im 11/41]
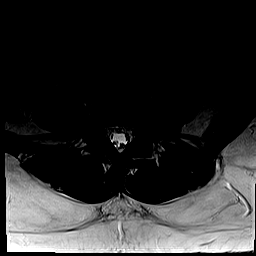
[im 19/41]
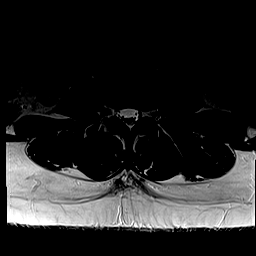
[im 22/41]
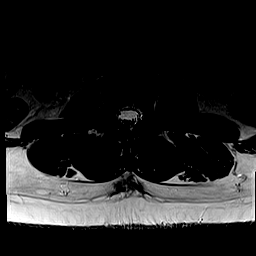
[im 30/41]
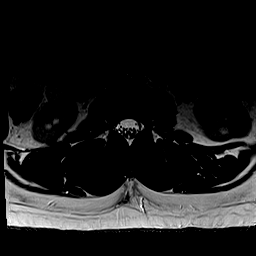
[im 33/41]
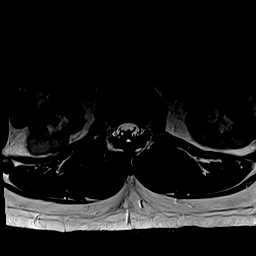
[im 37/41]
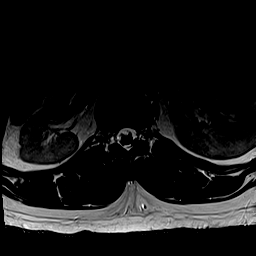
[im 41/41]
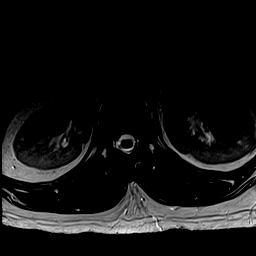

[Series 9: T1 · axial · 4.0mm · 0.39mm/px · z∈[-84,+157]mm · 8 of 41 slices shown (2 of 2)]
[im 1/41]
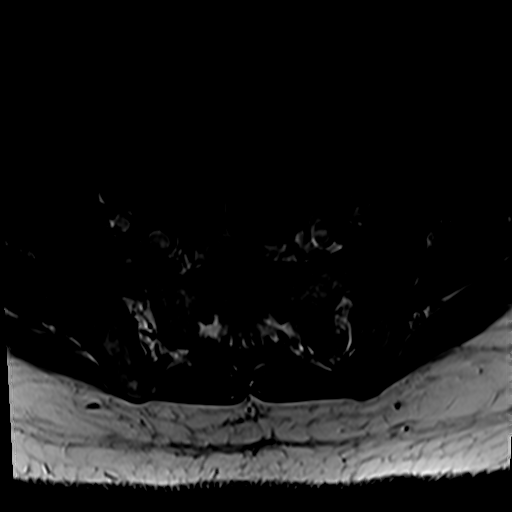
[im 8/41]
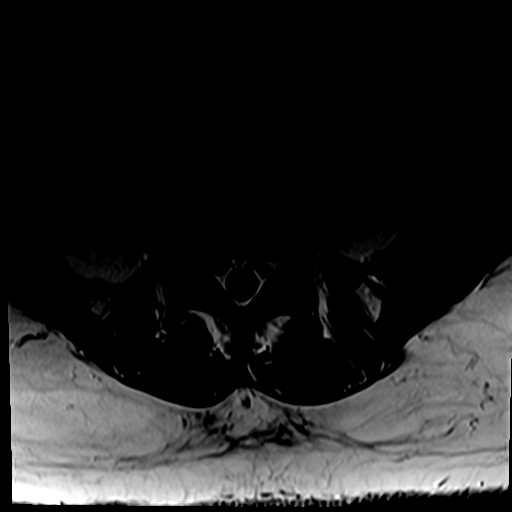
[im 11/41]
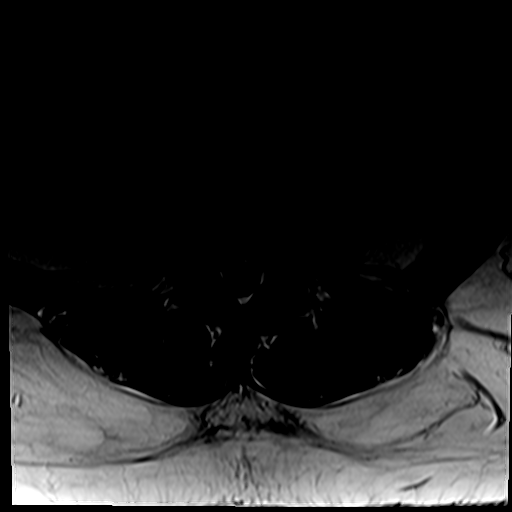
[im 19/41]
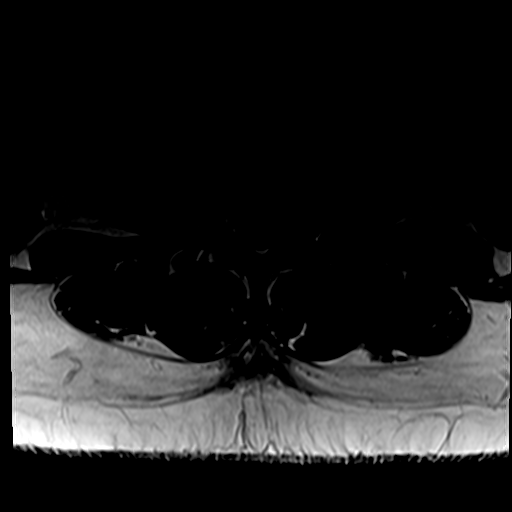
[im 22/41]
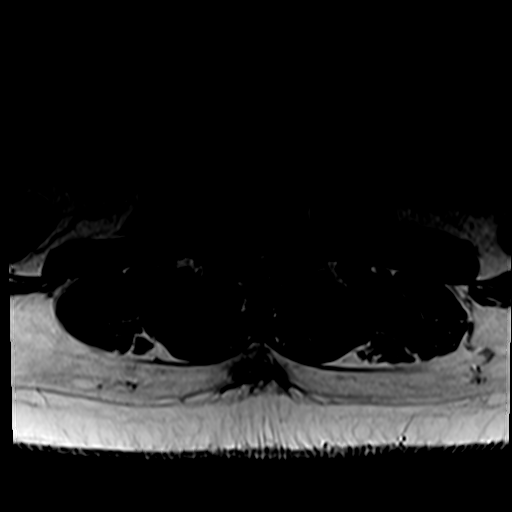
[im 30/41]
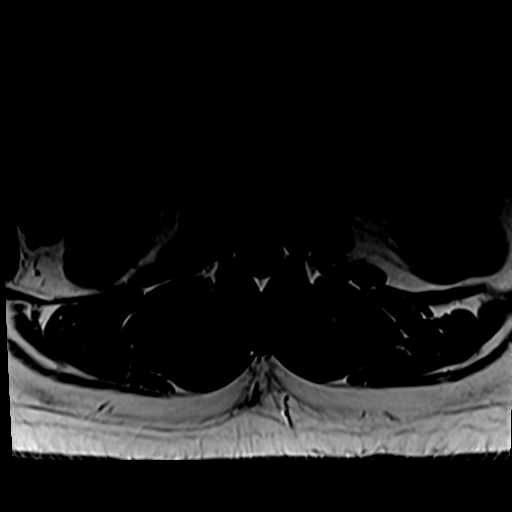
[im 33/41]
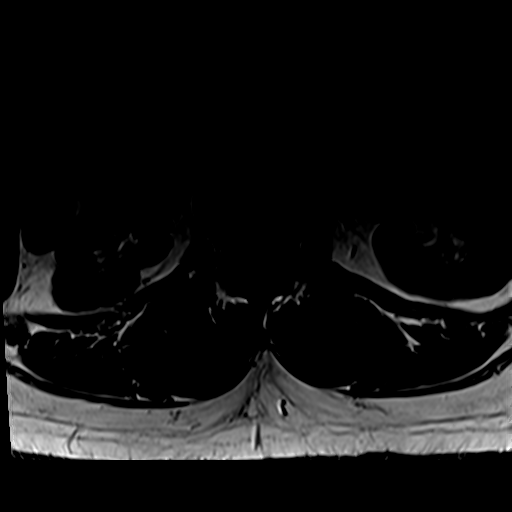
[im 41/41]
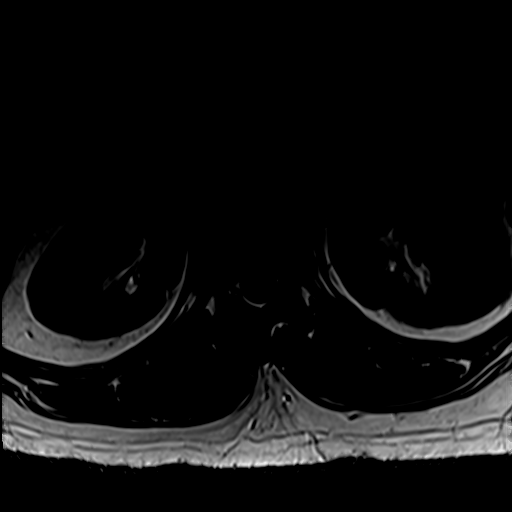

[Series 11: T2 · sagittal · 4.0mm · 0.81mm/px · 5 of 17 slices shown (3 of 3)]
[im 1/17]
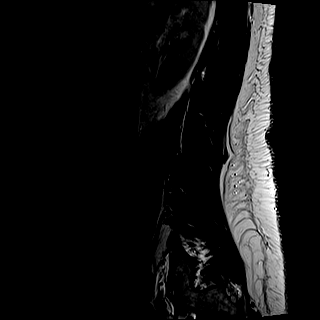
[im 5/17]
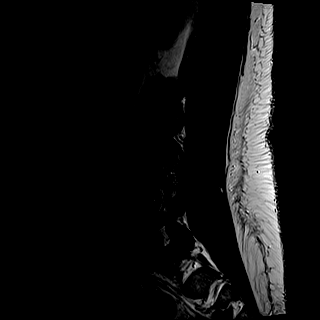
[im 9/17]
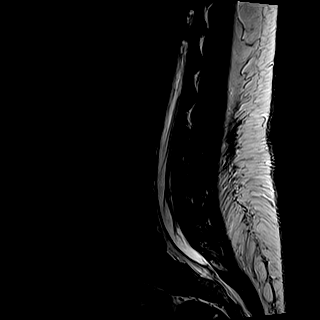
[im 13/17]
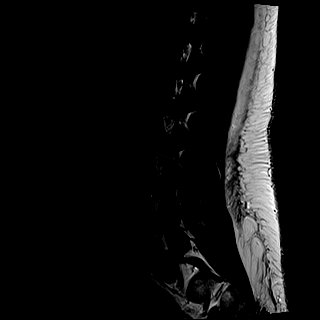
[im 17/17]
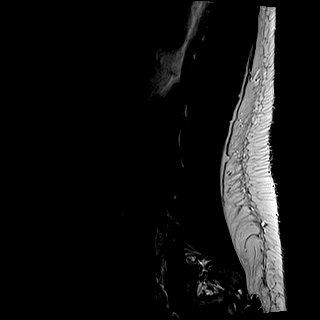

[32 of 48 positions shown; findings below may reference images not displayed]

FINDINGS: Segmentation:  Standard.

Alignment:  Physiologic.

Vertebrae:  No fracture, evidence of discitis, or bone lesion.

Conus medullaris and cauda equina: Conus extends to the L1 level.
Conus and cauda equina appear normal.

Paraspinal and other soft tissues: No acute paraspinal abnormality.
Incidental note made of cholelithiasis.

Disc levels:

Disc spaces: Disc spaces are preserved. Mild disc desiccation at
L4-5.

T12-L1: No significant disc bulge. No evidence of neural foraminal
stenosis. No central canal stenosis.

L1-L2: No significant disc bulge. No evidence of neural foraminal
stenosis. No central canal stenosis.

L2-L3: No significant disc bulge. No evidence of neural foraminal
stenosis. No central canal stenosis.

L3-L4: No significant disc bulge. No evidence of neural foraminal
stenosis. No central canal stenosis.

L4-L5: No significant disc bulge. No evidence of neural foraminal
stenosis. No central canal stenosis.

L5-S1: No significant disc bulge. No evidence of neural foraminal
stenosis. No central canal stenosis.
IMPRESSION: 1.  No acute osseous injury of the lumbar spine.
2. No significant lumbar spine disc protrusion, foraminal stenosis
or central canal stenosis.
3. Cholelithiasis.

## 2020-01-21 ENCOUNTER — Telehealth: Payer: Self-pay | Admitting: Cardiology

## 2020-01-21 DIAGNOSIS — I10 Essential (primary) hypertension: Secondary | ICD-10-CM

## 2020-01-21 MED ORDER — LISINOPRIL 20 MG PO TABS: 20.0000 mg | ORAL_TABLET | Freq: Two times a day (BID) | ORAL | 1 refills | Status: DC

## 2020-01-21 NOTE — Telephone Encounter (Signed)
Rx request sent to pharmacy.  

## 2020-03-12 ENCOUNTER — Ambulatory Visit: Payer: Medicaid Other | Admitting: Cardiology

## 2020-03-18 ENCOUNTER — Other Ambulatory Visit: Payer: Self-pay | Admitting: *Deleted

## 2020-03-18 MED ORDER — SPIRONOLACTONE 25 MG PO TABS: 25.0000 mg | ORAL_TABLET | Freq: Every day | ORAL | 0 refills | Status: DC

## 2020-03-26 ENCOUNTER — Other Ambulatory Visit: Payer: Self-pay

## 2020-03-26 ENCOUNTER — Ambulatory Visit (INDEPENDENT_AMBULATORY_CARE_PROVIDER_SITE_OTHER): Payer: Medicaid Other | Admitting: Cardiology

## 2020-03-26 ENCOUNTER — Encounter: Payer: Self-pay | Admitting: Cardiology

## 2020-03-26 VITALS — BP 110/86 | HR 81 | Ht 70.0 in | Wt 223.0 lb

## 2020-03-26 DIAGNOSIS — I10 Essential (primary) hypertension: Secondary | ICD-10-CM

## 2020-03-26 DIAGNOSIS — E669 Obesity, unspecified: Secondary | ICD-10-CM

## 2020-03-26 NOTE — Progress Notes (Signed)
Cardiology Office Note:    Date:  03/26/2020   ID:  Connie Whitney, DOB [birth year], MRN [id number]  PCP:  [facility]  Cardiologist:  Debbe Odea, MD  Electrophysiologist:  None   Referring MD: [facility]*   Chief Complaint  Patient presents with  . Other    3 month follow up - patient has no complaints at this time. Meds reviewed verbally with patient.     History of Present Illness:    Connie Whitney is a 40 y.o. female with a hx of fibromyalgia, hypertension, arthritis, joint hypermobility, who presents for follow-up.   Patient is being seen for hypertension.  Blood pressures have been difficult to control, multiple medications which she has had been made.  Current medication regimen is controlling her blood pressures well.  Has no side effects from medications, she rarely has episodes of low blood pressures.  She is trying to lose weight, recently started on phentermine by PCP.  Interested in weight loss program.  Prior notes BiDil was stopped after patient states her blood pressure bottomed out after taking BiDil.  Systolic blood pressure typically in the 90s and slowly go up throughout the day.  She states being on HCTZ before but that was also stopped likely due to low potassium levels.  Hydralazine was previously stopped due to blood pressures becoming very low. She also has a history of hypermobile joints with lumbar vertebrae displacement.  Has been seeing pain management for the past 5 years.  She is concerned she may have Marfan's.   Work-up for secondary causes including renal artery ultrasound, renin aldosterone studies, metanephrine studies, CT abdomen to evaluate adrenal mass have all been unremarkable.  Past Medical History:  Diagnosis Date  . Anemia   . Arthritis   . Atypical chest pain 03/12/2019  . Fibromyalgia   . Hypertension   . Lupus (HCC)   . Migraines   . Neuropathy   . Obesity (BMI 30.0-34.9) 03/12/2019  .  Palpitations 03/12/2019  . Vitamin D deficiency     Past Surgical History:  Procedure Laterality Date  . BARIATRIC SURGERY    . CESAREAN SECTION    . DILATION AND CURETTAGE, DIAGNOSTIC / THERAPEUTIC    . ESOPHAGOGASTRODUODENOSCOPY (EGD) WITH PROPOFOL N/A 08/02/2016   Procedure: ESOPHAGOGASTRODUODENOSCOPY (EGD) WITH PROPOFOL;  Surgeon: Scot Jun, MD;  Location: [facility];  Service: Endoscopy;  Laterality: N/A;  . HYSTEROSCOPY WITH NOVASURE N/A 08/21/2014   Procedure: HYSTEROSCOPY WITH NOVASURE;  Surgeon: [place] Bing, MD;  Location: [facility] ORS;  Service: Gynecology;  Laterality: N/A;  . TUBAL LIGATION      Current Medications: Current Meds  Medication Sig  . AMITIZA 24 MCG capsule Take 24 mcg by mouth 2 (two) times daily.  Marland Kitchen AMITRIPTYLINE HCL PO Take 10 mg by mouth 1 day or 1 dose. Takes as needed  . amLODipine (NORVASC) 5 MG tablet Take 0.5 tablets (2.5 mg total) by mouth 2 (two) times daily.  . carvedilol (COREG) 25 MG tablet Take 1 tablet (25 mg total) by mouth 2 (two) times daily.  . cloNIDine (CATAPRES - DOSED IN MG/24 HR) 0.3 mg/24hr patch Place 1 patch (0.3 mg total) onto the skin once a week.  . cyclobenzaprine (FLEXERIL) 10 MG tablet SMARTSIG:1 Tablet(s) By Mouth Every 12 Hours  . gabapentin (NEURONTIN) 300 MG capsule Take 3 capsules (900 mg total) by mouth at bedtime.  Marland Kitchen LINZESS 290 MCG CAPS capsule Take 290 mcg by mouth daily.  Marland Kitchen lisinopril (ZESTRIL)  20 MG tablet Take 1 tablet (20 mg total) by mouth 2 (two) times daily.  . Multiple Vitamin (MULTIVITAMIN) capsule   . oxyCODONE-acetaminophen (PERCOCET) 7.5-325 MG tablet Take 1 tablet by mouth every 4 (four) hours as needed for severe pain.  . phentermine 37.5 MG capsule Take 37.5 mg by mouth daily.  Marland Kitchen spironolactone (ALDACTONE) 25 MG tablet Take 1 tablet (25 mg total) by mouth at bedtime.     Allergies:   Lac bovis, Milk-related compounds, Pravastatin, and Venlafaxine   Social History   Socioeconomic History   . Marital status: Single    Spouse name: Not on file  . Number of children: Not on file  . Years of education: Not on file  . Highest education level: Not on file  Occupational History  . Not on file  Tobacco Use  . Smoking status: Never Smoker  . Smokeless tobacco: Never Used  Vaping Use  . Vaping Use: Never used  Substance and Sexual Activity  . Alcohol use: No  . Drug use: No  . Sexual activity: Never  Other Topics Concern  . Not on file  Social History Narrative  . Not on file   Social Determinants of Health   Financial Resource Strain: Not on file  Food Insecurity: Not on file  Transportation Needs: Not on file  Physical Activity: Not on file  Stress: Not on file  Social Connections: Not on file     Family History: The patient's family history includes Heart failure in her paternal aunt; Heart murmur in her maternal grandfather and mother; Hyperlipidemia in her father; Hypertension in her father and mother; Stroke in her maternal grandfather.  ROS:   Please see the history of present illness.     All other systems reviewed and are negative.  EKGs/Labs/Other Studies Reviewed:    The following studies were reviewed today:   EKG:  EKG is  ordered today.  The ekg ordered today demonstrates normal sinus rhythm  Recent Labs: No results found for requested labs within last 8760 hours.  Recent Lipid Panel No results found for: CHOL, TRIG, HDL, CHOLHDL, VLDL, LDLCALC, LDLDIRECT  Physical Exam:    VS:  BP 110/86 (BP Location: Left Arm, Patient Position: Sitting, Cuff Size: Normal)   Pulse 81   Ht 5\' 10"  (1.778 m)   Wt 223 lb (101.2 kg)   SpO2 97%   BMI 32.00 kg/m     Wt Readings from Last 3 Encounters:  03/26/20 223 lb (101.2 kg)  12/09/19 235 lb 4 oz (106.7 kg)  09/22/19 228 lb (103.4 kg)     GEN:  Well nourished, well developed in no acute distress HEENT: Normal NECK: No JVD; No carotid bruits LYMPHATICS: No lymphadenopathy CARDIAC: RRR, 1/6  systolic murmur at the right upper sternal border.  No rubs or gallops. RESPIRATORY:  Clear to auscultation without rales, wheezing or rhonchi  ABDOMEN: Soft, non-tender, non-distended MUSCULOSKELETAL: Patient noted to have hypermobile joints. SKIN: Warm and dry NEUROLOGIC:  Alert and oriented x 3 PSYCHIATRIC:  Normal affect    ASSESSMENT:    1. Hypertension 2. Obesity  PLAN:    1. Hypertension. Work-up for secondary causes have been unrevealing.  Blood pressures controlled  Continue clonidine transdermal patch 0.3 mg / 24-hour every week.  amlodipine to 2.5 mg twice daily, Coreg 25 mg twice daily, spironolactone 25 mg nightly, lisinopril 20mg  bid .  2. Obesity, low-calorie diet, exercise recommended.  Will refer patient to weight loss clinic for additional input.  Follow-up in 6 months  This note was generated in part or whole with voice recognition software. Voice recognition is usually quite accurate but there are transcription errors that can and very often do occur. I apologize for any typographical errors that were not detected and corrected.  Medication Adjustments/Labs and Tests Ordered: Current medicines are reviewed at length with the patient today.  Concerns regarding medicines are outlined above.  Orders Placed This Encounter  Procedures  . EKG 12-Lead   No orders of the defined types were placed in this encounter.   Patient Instructions  Medication Instructions:   Your physician recommends that you continue on your current medications as directed. Please refer to the Current Medication list given to you today.  *If you need a refill on your cardiac medications before your next appointment, please call your pharmacy*   Lab Work: None ordered If you have labs (blood work) drawn today and your tests are completely normal, you will receive your results only by: Marland Kitchen MyChart Message (if you have MyChart) OR . A paper copy in the mail If you have any lab test that is  abnormal or we need to change your treatment, we will call you to review the results.   Testing/Procedures: None ordered   Follow-Up: At [facility], you and your health needs are our priority.  As part of our continuing mission to provide you with exceptional heart care, we have created designated Provider Care Teams.  These Care Teams include your primary Cardiologist (physician) and Advanced Practice Providers (APPs -  Physician Assistants and Nurse Practitioners) who all work together to provide you with the care you need, when you need it.  We recommend signing up for the patient portal called "MyChart".  Sign up information is provided on this After Visit Summary.  MyChart is used to connect with patients for Virtual Visits (Telemedicine).  Patients are able to view lab/test results, encounter notes, upcoming appointments, etc.  Non-urgent messages can be sent to your provider as well.   To learn more about what you can do with MyChart, go to [email or web address].    Your next appointment:   6 month(s)  The format for your next appointment:   In Person  Provider:   Debbe Odea, MD   Other Instructions      Signed, Debbe Odea, MD  03/26/2020 12:43 PM    [facility]

## 2020-03-26 NOTE — Patient Instructions (Signed)

## 2020-03-31 ENCOUNTER — Telehealth: Payer: Self-pay

## 2020-03-31 DIAGNOSIS — E669 Obesity, unspecified: Secondary | ICD-10-CM

## 2020-03-31 NOTE — Telephone Encounter (Signed)
Put orders in for patient to go to [facility] as ordered during OV on 03/26/20. I had to get in touch with the center to learn the proper way to input the order. I sent a MyChart message to the patient letting her know the orders are now in as I informed her that I would do during the OV.

## 2020-04-23 ENCOUNTER — Other Ambulatory Visit: Payer: Self-pay

## 2020-04-23 MED ORDER — AMLODIPINE BESYLATE 5 MG PO TABS: 2.5000 mg | ORAL_TABLET | Freq: Two times a day (BID) | ORAL | 3 refills | Status: DC

## 2020-05-20 ENCOUNTER — Other Ambulatory Visit: Payer: Self-pay

## 2020-05-20 ENCOUNTER — Encounter: Payer: Medicaid Other | Attending: Cardiology | Admitting: Dietician

## 2020-05-20 VITALS — Ht 70.0 in | Wt 222.9 lb

## 2020-05-20 DIAGNOSIS — E66811 Obesity, class 1: Secondary | ICD-10-CM

## 2020-05-20 DIAGNOSIS — E669 Obesity, unspecified: Secondary | ICD-10-CM | POA: Diagnosis not present

## 2020-05-20 DIAGNOSIS — Z713 Dietary counseling and surveillance: Secondary | ICD-10-CM | POA: Diagnosis not present

## 2020-05-20 DIAGNOSIS — Z6831 Body mass index (BMI) 31.0-31.9, adult: Secondary | ICD-10-CM | POA: Insufficient documentation

## 2020-05-20 NOTE — Patient Instructions (Signed)
   Eat meals/ snacks at regular intervals, ideally every 3-4 hours during the day.   Try a food tracking app such as Baritastic, my fitness pal, lose it, spark people, or other options.  Continue to emphasize lean protein foods and low carb veggies, with small amounts of fruits or starchy veggies.   Work towards eating at least 60grams of protein foods daily. Can increase to 70-80grams gradually if needed.
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
## 2020-05-21 MED ORDER — SPIRONOLACTONE 25 MG PO TABS: 25.0000 mg | ORAL_TABLET | Freq: Every day | ORAL | 3 refills | Status: DC

## 2020-06-23 ENCOUNTER — Ambulatory Visit: Payer: Medicaid Other | Admitting: Dietician

## 2020-08-04 ENCOUNTER — Ambulatory Visit: Payer: Medicaid Other | Admitting: Dietician

## 2020-08-25 ENCOUNTER — Encounter: Payer: Self-pay | Admitting: Dietician

## 2020-08-25 NOTE — Progress Notes (Signed)
Have not heard back from patient to reschedule her cancelled appointment from 08/04/20 (rescheduled from 06/23/20). Sent notification to referring provider.

## 2020-08-30 ENCOUNTER — Telehealth: Payer: Self-pay | Admitting: Cardiology

## 2020-08-30 MED ORDER — CLONIDINE 0.3 MG/24HR TD PTWK: 0.3000 mg | MEDICATED_PATCH | TRANSDERMAL | 1 refills | Status: DC

## 2020-08-30 NOTE — Telephone Encounter (Signed)
*  STAT* If patient is at the pharmacy, call can be transferred to refill team.   1. Which medications need to be refilled? (please list name of each medication and dose if known) clonidine (CATAPRES) 0.3 mg / 24 hr patch  2. Which pharmacy/location (including street and city if local pharmacy) is medication to be sent to? [facility] pharmacy   3. Do they need a 30 day or 90 day supply?

## 2020-08-30 NOTE — Telephone Encounter (Signed)
Requested Prescriptions   Signed Prescriptions Disp Refills   cloNIDine (CATAPRES - DOSED IN MG/24 HR) 0.3 mg/24hr patch 4 patch 1    Sig: Place 1 patch (0.3 mg total) onto the skin once a week.    Authorizing Provider: Debbe Odea    Ordering User: Kendrick Fries

## 2020-09-24 ENCOUNTER — Ambulatory Visit: Payer: Medicaid Other | Admitting: Cardiology

## 2020-09-27 ENCOUNTER — Encounter: Payer: Self-pay | Admitting: Cardiology

## 2020-09-27 ENCOUNTER — Other Ambulatory Visit: Payer: Self-pay

## 2020-09-27 ENCOUNTER — Ambulatory Visit (INDEPENDENT_AMBULATORY_CARE_PROVIDER_SITE_OTHER): Payer: Medicaid Other | Admitting: Cardiology

## 2020-09-27 VITALS — BP 130/84 | HR 83 | Ht 70.0 in | Wt 209.0 lb

## 2020-09-27 DIAGNOSIS — I10 Essential (primary) hypertension: Secondary | ICD-10-CM

## 2020-09-27 DIAGNOSIS — Z6829 Body mass index (BMI) 29.0-29.9, adult: Secondary | ICD-10-CM

## 2020-09-27 NOTE — Progress Notes (Signed)
Cardiology Office Note:    Date:  09/27/2020   ID:  Connie Whitney, DOB 1980/06/12, MRN [id number]  PCP:  [facility]  Cardiologist:  Debbe Odea, MD  Electrophysiologist:  None   Referring MD: [facility]*   Chief Complaint  Patient presents with   Other    6 month follow up. Meds reviewed verbally with patient.     History of Present Illness:    Connie Whitney is a 40 y.o. female with a hx of fibromyalgia, hypertension, arthritis, joint hypermobility, who presents for follow-up.   Patient being seen for hypertension.  Blood pressure medications have been adjusted, currently well controlled with systolics in the 120s to 130s.  She was at a cookout recently, at home her dog and had an episode of syncope.  Blood sugars at the time were noted to be in the 400s.  She was told her blood pressure was 70.  She states feeling well since then.  She did not go to the hospital, her blood pressure is normalized.  Plans to follow-up with primary care provider regarding blood sugar control.  Has also been eating healthier, achieving weight loss.  Takes phentermine to help with weight loss.   Prior notes BiDil was stopped after patient states her blood pressure bottomed out after taking BiDil.  Systolic blood pressure typically in the 90s and slowly go up throughout the day.  She states being on HCTZ before but that was also stopped likely due to low potassium levels.  Hydralazine was previously stopped due to blood pressures becoming very low. She also has a history of hypermobile joints with lumbar vertebrae displacement.  Has been seeing pain management for the past 5 years.  She is concerned she may have Marfan's.   Work-up for secondary causes including renal artery ultrasound, renin aldosterone studies, metanephrine studies, CT abdomen to evaluate adrenal mass have all been unremarkable.  Past Medical History:  Diagnosis Date   Anemia    Arthritis     Atypical chest pain 03/12/2019   Fibromyalgia    Hypertension    Lupus (HCC)    Migraines    Neuropathy    Obesity (BMI 30.0-34.9) 03/12/2019   Palpitations 03/12/2019   Vitamin D deficiency     Past Surgical History:  Procedure Laterality Date   BARIATRIC SURGERY     CESAREAN SECTION     DILATION AND CURETTAGE, DIAGNOSTIC / THERAPEUTIC     ESOPHAGOGASTRODUODENOSCOPY (EGD) WITH PROPOFOL N/A 08/02/2016   Procedure: ESOPHAGOGASTRODUODENOSCOPY (EGD) WITH PROPOFOL;  Surgeon: Scot Jun, MD;  Location: [facility] ENDOSCOPY;  Service: Endoscopy;  Laterality: N/A;   HYSTEROSCOPY WITH NOVASURE N/A 08/21/2014   Procedure: HYSTEROSCOPY WITH NOVASURE;  Surgeon: Hyrum Bing, MD;  Location: [facility] ORS;  Service: Gynecology;  Laterality: N/A;   TUBAL LIGATION      Current Medications: Current Meds  Medication Sig   AMITIZA 24 MCG capsule Take 24 mcg by mouth 2 (two) times daily.   AMITRIPTYLINE HCL PO Take 10 mg by mouth 1 day or 1 dose. Takes as needed   amLODipine (NORVASC) 5 MG tablet Take 0.5 tablets (2.5 mg total) by mouth 2 (two) times daily.   carvedilol (COREG) 25 MG tablet Take 1 tablet (25 mg total) by mouth 2 (two) times daily.   cloNIDine (CATAPRES - DOSED IN MG/24 HR) 0.3 mg/24hr patch Place 1 patch (0.3 mg total) onto the skin once a week.   cyclobenzaprine (FLEXERIL) 10 MG tablet SMARTSIG:1 Tablet(s) By Mouth  Every 12 Hours   gabapentin (NEURONTIN) 300 MG capsule Take 3 capsules (900 mg total) by mouth at bedtime.   LINZESS 290 MCG CAPS capsule Take 290 mcg by mouth daily.   lisinopril (ZESTRIL) 20 MG tablet Take 1 tablet (20 mg total) by mouth 2 (two) times daily.   Multiple Vitamin (MULTIVITAMIN) capsule    oxyCODONE-acetaminophen (PERCOCET) 7.5-325 MG tablet Take 1 tablet by mouth every 4 (four) hours as needed for severe pain.   OZEMPIC, 0.25 OR 0.5 MG/DOSE, 2 MG/1.5ML SOPN Inject into the skin.   phentermine 37.5 MG capsule Take 37.5 mg by mouth daily.   spironolactone  (ALDACTONE) 25 MG tablet Take 1 tablet (25 mg total) by mouth at bedtime.     Allergies:   Lac bovis, Milk-related compounds, Pravastatin, and Venlafaxine   Social History   Socioeconomic History   Marital status: Single    Spouse name: Not on file   Number of children: Not on file   Years of education: Not on file   Highest education level: Not on file  Occupational History   Not on file  Tobacco Use   Smoking status: Never   Smokeless tobacco: Never  Vaping Use   Vaping Use: Never used  Substance and Sexual Activity   Alcohol use: No   Drug use: No   Sexual activity: Never  Other Topics Concern   Not on file  Social History Narrative   Not on file   Social Determinants of Health   Financial Resource Strain: Not on file  Food Insecurity: Not on file  Transportation Needs: Not on file  Physical Activity: Not on file  Stress: Not on file  Social Connections: Not on file     Family History: The patient's family history includes Heart failure in her paternal aunt; Heart murmur in her maternal grandfather and mother; Hyperlipidemia in her father; Hypertension in her father and mother; Stroke in her maternal grandfather.  ROS:   Please see the history of present illness.     All other systems reviewed and are negative.  EKGs/Labs/Other Studies Reviewed:    The following studies were reviewed today:   EKG:  EKG is  ordered today.  The ekg ordered today demonstrates normal sinus rhythm  Recent Labs: No results found for requested labs within last 8760 hours.  Recent Lipid Panel No results found for: CHOL, TRIG, HDL, CHOLHDL, VLDL, LDLCALC, LDLDIRECT  Physical Exam:    VS:  BP 130/84 (BP Location: Left Arm, Patient Position: Sitting, Cuff Size: Normal)   Pulse 83   Ht 5\' 10"  (1.778 m)   Wt 209 lb (94.8 kg)   SpO2 99%   BMI 29.99 kg/m     Wt Readings from Last 3 Encounters:  09/27/20 209 lb (94.8 kg)  05/20/20 222 lb 14.4 oz (101.1 kg)  03/26/20 223 lb  (101.2 kg)     GEN:  Well nourished, well developed in no acute distress HEENT: Normal NECK: No JVD; No carotid bruits LYMPHATICS: No lymphadenopathy CARDIAC: RRR, 1/6 systolic murmur at the right upper sternal border.  No rubs or gallops. RESPIRATORY:  Clear to auscultation without rales, wheezing or rhonchi  ABDOMEN: Soft, non-tender, non-distended MUSCULOSKELETAL: Patient noted to have hypermobile joints. SKIN: Warm and dry NEUROLOGIC:  Alert and oriented x 3 PSYCHIATRIC:  Normal affect    ASSESSMENT:    1. Hypertension 2. overweight  PLAN:    1. Hypertension. Work-up for secondary causes have been unrevealing.  Blood pressures controlled  Continue clonidine transdermal patch 0.3 mg / 24-hour every week.  amlodipine to 2.5 mg twice daily, Coreg 25 mg twice daily, spironolactone 25 mg nightly, lisinopril 20mg  bid .  2.  Overweight, patient congratulated on weight loss.  Takes phentermine prescribed by PCP.  Advised to continue, low-calorie diet, exercise   Follow-up yearly.  This note was generated in part or whole with voice recognition software. Voice recognition is usually quite accurate but there are transcription errors that can and very often do occur. I apologize for any typographical errors that were not detected and corrected.  Medication Adjustments/Labs and Tests Ordered: Current medicines are reviewed at length with the patient today.  Concerns regarding medicines are outlined above.  Orders Placed This Encounter  Procedures   EKG 12-Lead    No orders of the defined types were placed in this encounter.   Patient Instructions  Medication Instructions:   Your physician recommends that you continue on your current medications as directed. Please refer to the Current Medication list given to you today.   *If you need a refill on your cardiac medications before your next appointment, please call your pharmacy*   Lab Work: None ordered If you have labs  (blood work) drawn today and your tests are completely normal, you will receive your results only by: MyChart Message (if you have MyChart) OR A paper copy in the mail If you have any lab test that is abnormal or we need to change your treatment, we will call you to review the results.   Testing/Procedures: None ordered   Follow-Up: At [facility], you and your health needs are our priority.  As part of our continuing mission to provide you with exceptional heart care, we have created designated Provider Care Teams.  These Care Teams include your primary Cardiologist (physician) and Advanced Practice Providers (APPs -  Physician Assistants and Nurse Practitioners) who all work together to provide you with the care you need, when you need it.  We recommend signing up for the patient portal called "MyChart".  Sign up information is provided on this After Visit Summary.  MyChart is used to connect with patients for Virtual Visits (Telemedicine).  Patients are able to view lab/test results, encounter notes, upcoming appointments, etc.  Non-urgent messages can be sent to your provider as well.   To learn more about what you can do with MyChart, go to [facility].    Your next appointment:   1 year(s)  The format for your next appointment:   In Person  Provider:   [email or web address], MD   Other Instructions    Signed, Debbe Odea, MD  09/27/2020 9:54 AM    [facility]

## 2020-09-27 NOTE — Patient Instructions (Signed)

## 2020-09-29 ENCOUNTER — Other Ambulatory Visit: Payer: Self-pay

## 2020-09-29 DIAGNOSIS — I10 Essential (primary) hypertension: Secondary | ICD-10-CM

## 2020-09-29 MED ORDER — LISINOPRIL 20 MG PO TABS: 20.0000 mg | ORAL_TABLET | Freq: Two times a day (BID) | ORAL | 3 refills | Status: DC

## 2020-09-29 NOTE — Telephone Encounter (Signed)
*  STAT* If patient is at the pharmacy, call can be transferred to refill team.   1. Which medications need to be refilled? (please list name of each medication and dose if known) Lisinopril  2. Which pharmacy/location (including street and city if local pharmacy) is medication to be sent to? [facility]   3. Do they need a 30 day or 90 day supply? 90

## 2020-11-08 ENCOUNTER — Telehealth: Payer: Self-pay | Admitting: Cardiology

## 2020-11-08 NOTE — Telephone Encounter (Signed)
Call transferred directly to this RN from scheduling.  The patient call to report 2 episodes of syncope over the last 7 days.  1st episode was earlier in the earlier- mid last week. The patient was by herself at home. She was walking to the bathroom and started to feel dizzy/ lightheaded.  She recognized these symptoms as a precursor for her syncope, so she was able to "brace" herself. She advised she passed out, but did not hit the floor. She is unsure of how long she passed out for, but after the episode, she was able to lay down for ~ 1 hour and symptoms passed. She just felt fatigued after. She denies any feelings of tachycardia leading up to her episode.  2nd episode was yesterday.  She had been to the bathroom (confirmed she had a bowel movement) and when walking back to the bedroom, she felt dizzy/ lightheaded, but was able to sit down.  She felt like she was going to vomit and did so x 2 . She advised her boyfriend was present for this episode and, when able, she advised him to go get her mother, who is a Engineer, civil (consulting), to check her vital signs. HR was 110, per pulse oximeter. BP was 80/? (Per the patient her mother could not obtain a DBP reading). Her mother was also unable to palpate a pulse and advised EMS be called. The patient refused this and stated she wanted to wait a little bit to see if she felt better, and if not, she would allow her mother to drive her to the ER.  The patient states she did not end up going to the ER, but was very fatigued afterward.  She advised she is feeling ok to today, but is concerned that her "episodes" are happening more often.   She confirms she is drinking fluids. I have reviewed her cardiac medications and they are correct as listed in her chart. I have also confirmed no new non cardiac medication changes.  The patient does monitor her BP at home and she is typically ~ 120/80.  The patient is aware I will forward the message to Dr. Azucena Cecil  for further review and we will call her back once recommendations are received.   The patient voices understanding and is agreeable.

## 2020-11-08 NOTE — Telephone Encounter (Signed)
Pt c/o Syncope: STAT if syncope occurred within 30 minutes and pt complains of lightheadedness High Priority if episode of passing out, completely, today or in last 24 hours   Did you pass out today? no  When is the last time you passed out? Yesterday morning  Has this occurred multiple times? Twice in the last 7days   Did you have any symptoms prior to passing out? Hot & sweating & dizzy

## 2020-11-09 MED ORDER — AMLODIPINE BESYLATE 5 MG PO TABS: ORAL_TABLET | ORAL | 3 refills | Status: DC

## 2020-11-09 NOTE — Telephone Encounter (Signed)
Lorien, Shingler - 11/08/2020 10:12 AM Debbe Odea, MD  Sent: Tue November 09, 2020 10:40 AM  To: Jefferey Pica, RN  Cc: Gibson Ramp, RN          Message  Have patient reduce Norvasc to 2.5 mg daily only (currently takes it twice daily).  She should take medication in the evening.    Symptoms appear orthostatic versus vasovagal in etiology.    Continue to monitor symptoms.  If symptoms persist, patient should schedule follow-up appointment.    BA

## 2020-11-09 NOTE — Telephone Encounter (Signed)
I spoke with the patient regarding Dr. Merita Norton recommendations to: - Decrease amlodipine to 2.5 mg once daily in the evening.  The patient states she is on medication for her bowels where she does not have to strain, so she does not feel like she experienced a vasovagal response with her most recent episode of syncope/ pre-syncope.  I have asked her to please try to obtain Orthostatic BP readings at her convenience and I have reviewed with her how to obtain these.  She states she works for an [employer] and this has been done. I have asked her to obtain an updated set of orthostatic readings if possible and send [place] a mychart message with these readings.  She is agreeable with decreasing amlodipine to 2.5 mg once daily in the evening. I have advised her if symptoms re-occur, she will need to come in to the office for an appointment.  The patient voices understanding and is agreeable.

## 2020-12-20 ENCOUNTER — Other Ambulatory Visit: Payer: Self-pay | Admitting: *Deleted

## 2020-12-20 MED ORDER — CLONIDINE 0.3 MG/24HR TD PTWK: 0.3000 mg | MEDICATED_PATCH | TRANSDERMAL | 1 refills | Status: DC

## 2021-06-14 ENCOUNTER — Encounter: Payer: Self-pay | Admitting: Obstetrics and Gynecology

## 2021-07-07 ENCOUNTER — Ambulatory Visit (INDEPENDENT_AMBULATORY_CARE_PROVIDER_SITE_OTHER): Payer: Medicaid Other | Admitting: Obstetrics

## 2021-07-07 ENCOUNTER — Other Ambulatory Visit (HOSPITAL_COMMUNITY)
Admission: RE | Admit: 2021-07-07 | Discharge: 2021-07-07 | Disposition: A | Discharge status: Home / Self Care | Payer: Medicaid Other | Source: Ambulatory Visit | Attending: Obstetrics | Admitting: Obstetrics

## 2021-07-07 ENCOUNTER — Encounter: Payer: Self-pay | Admitting: Obstetrics

## 2021-07-07 VITALS — BP 118/75 | HR 102 | Ht 71.0 in | Wt 173.6 lb

## 2021-07-07 DIAGNOSIS — N83201 Unspecified ovarian cyst, right side: Secondary | ICD-10-CM

## 2021-07-07 DIAGNOSIS — Z01419 Encounter for gynecological examination (general) (routine) without abnormal findings: Secondary | ICD-10-CM

## 2021-07-07 DIAGNOSIS — Z124 Encounter for screening for malignant neoplasm of cervix: Secondary | ICD-10-CM

## 2021-07-07 NOTE — Progress Notes (Signed)
SUBJECTIVE  HPI  Connie Whitney is a 41 y.o.-year-old female who presents for an annual gynecological exam and Pap smear today. She would also like further evaluation of a possibly hemorrhagic right ovarian cyst found incidentally on CT. She had an endometrial ablation in 2016, which worked well to stop her heavy menstrual bleeding. However, she has started to notice cyclical cramping and spotting again. She reports occasional pelvic pain that comes in goes with no clear triggers. She denies abnormal vaginal discharge and dyspareunia.  Medical/Surgical History Past Medical History:  Diagnosis Date   Anemia    Arthritis    Atypical chest pain 03/12/2019   Fibromyalgia    Hypertension    Lupus (HCC)    Migraines    Neuropathy    Obesity (BMI 30.0-34.9) 03/12/2019   Palpitations 03/12/2019   Vitamin D deficiency    Past Surgical History:  Procedure Laterality Date   BARIATRIC SURGERY     CESAREAN SECTION     DILATION AND CURETTAGE, DIAGNOSTIC / THERAPEUTIC     ESOPHAGOGASTRODUODENOSCOPY (EGD) WITH PROPOFOL N/A 08/02/2016   Procedure: ESOPHAGOGASTRODUODENOSCOPY (EGD) WITH PROPOFOL;  Surgeon: Scot Jun, MD;  Location[facility] ENDOSCOPY;  Service: Endoscopy;  Laterality: N/A;   HYSTEROSCOPY WITH NOVASURE N/A 08/21/2014   Procedure: HYSTEROSCOPY WITH NOVASURE;  Surgeon: Seneca Bing, MD;  Location: [facility] ORS;  Service: Gynecology;  Laterality: N/A;   TUBAL LIGATION      Social History Lives with mother and son Work med Environmental health practitioner Exercise: work Substances: denies EtOH, tobacco, vape, recreational drugs  Obstetric History OB History     Gravida  3   Para  1   Term  1   Preterm  0   AB  2   Living  1      SAB  1   IAB  1   Ectopic  0   Multiple  0   Live Births  1            GYN/Menstrual History No LMP recorded (lmp unknown). Patient has had an ablation. Last Pap: Unsure, possibly 2016 Contraception: BTL  Prevention Endorses regular dental and  eye exams Mammogram: declines  Current Medications Outpatient Medications Prior to Visit  Medication Sig   AMITIZA 24 MCG capsule Take 24 mcg by mouth 2 (two) times daily.   AMITRIPTYLINE HCL PO Take 10 mg by mouth 1 day or 1 dose. Takes as needed   amLODipine (NORVASC) 5 MG tablet Take 0.5 tablet (2.5 mg) by mouth once daily in the evening   carvedilol (COREG) 25 MG tablet Take 1 tablet (25 mg total) by mouth 2 (two) times daily.   cloNIDine (CATAPRES - DOSED IN MG/24 HR) 0.3 mg/24hr patch Place 1 patch (0.3 mg total) onto the skin once a week.   cyclobenzaprine (FLEXERIL) 10 MG tablet SMARTSIG:1 Tablet(s) By Mouth Every 12 Hours   gabapentin (NEURONTIN) 300 MG capsule Take 3 capsules (900 mg total) by mouth at bedtime.   LINZESS 290 MCG CAPS capsule Take 290 mcg by mouth daily.   lisinopril (ZESTRIL) 20 MG tablet Take 1 tablet (20 mg total) by mouth 2 (two) times daily.   Multiple Vitamin (MULTIVITAMIN) capsule    oxyCODONE-acetaminophen (PERCOCET) 7.5-325 MG tablet Take 1 tablet by mouth every 4 (four) hours as needed for severe pain.   OZEMPIC, 0.25 OR 0.5 MG/DOSE, 2 MG/1.5ML SOPN Inject into the skin.   phentermine 37.5 MG capsule Take 37.5 mg by mouth daily.   spironolactone (ALDACTONE) 25 MG  tablet Take 1 tablet (25 mg total) by mouth at bedtime.   No facility-administered medications prior to visit.       ROS History obtained from the patient General ROS: negative for - chills, fatigue, or night sweats Psychological ROS: positive for - anxiety and depression Ophthalmic ROS: negative for - blurry vision or decreased vision ENT ROS: negative for - headaches or sore throat Hematological and Lymphatic ROS: negative for - bruising or swollen lymph nodes Endocrine ROS: positive for - palpitations negative for - hot flashes or mood swings Breast ROS: negative for breast lumps Respiratory ROS: no cough, shortness of breath, or wheezing Cardiovascular ROS: no chest pain or dyspnea  on exertion Gastrointestinal ROS: no abdominal pain, change in bowel habits, or black or bloody stools Genito-Urinary ROS: no dysuria, trouble voiding, or hematuria Musculoskeletal ROS: positive for - pain r/t fibromyalgia and nerve damage Dermatological ROS: negative     05/20/2020    8:46 AM 02/27/2019    1:53 PM 06/04/2015    2:43 PM 05/07/2015    1:26 PM 04/08/2015    1:38 PM  Depression screen PHQ 2/9  Decreased Interest  0 0 0 0  Down, Depressed, Hopeless 1 0 0 0 0  PHQ - 2 Score 1 0 0 0 0     OBJECTIVE  Last Weight  Most recent update: 07/07/2021  1:25 PM    Weight  78.7 kg (173 lb 9.6 oz)             Body mass index is 24.21 kg/m.    BP 118/75   Pulse (!) 102   Ht 5\' 11"  (1.803 m)   Wt 173 lb 9.6 oz (78.7 kg)   LMP  (LMP Unknown)   BMI 24.21 kg/m  General appearance: alert, cooperative, and appears stated age Head: Normocephalic, without obvious abnormality, atraumatic Eyes: negative findings: lids and lashes normal and conjunctivae and sclerae normal Neck: no adenopathy, supple, symmetrical, trachea midline, and thyroid not enlarged, symmetric, no tenderness/mass/nodules Lungs: clear to auscultation bilaterally Breasts: normal appearance, no masses or tenderness, Inspection negative, No nipple retraction or dimpling, No axillary or supraclavicular adenopathy, Normal to palpation without dominant masses Heart: regular rate and rhythm, S1, S2 normal, no murmur, click, rub or gallop Abdomen: soft, non-tender; bowel sounds normal; no masses,  no organomegaly Pelvic: cervix normal in appearance, external genitalia normal, no cervical motion tenderness, rectovaginal septum normal, and vagina normal without discharge Extremities: extremities normal, atraumatic, no cyanosis or edema Pulses: 2+ and symmetric Skin: Skin color, texture, turgor normal. No rashes or lesions Lymph nodes: Cervical, supraclavicular, and axillary nodes normal.  ASSESSMENT  1) Annual exam 2) Pap  due 3) Ovarian cyst 4) Return of menstrual bleeding after ablation  PLAN 1) Physical exam as noted. Declines STI screening. Lab work completed with PCP and specialists. Discussed healthy lifestyle choices. Recommend mammogram by age 24. 2) Pap collected. F/u based on results 3) Pelvic 54 ordered 4) Schedule consult with Dr. [place] for ablatiion   Valentino Saxon, CNM

## 2021-07-08 ENCOUNTER — Telehealth: Payer: Self-pay | Admitting: Obstetrics and Gynecology

## 2021-07-08 NOTE — Telephone Encounter (Signed)
Spoke with pt- to make aware of [place] apt- she said she will be out of town that [employer] work, I made her aware that I sent mychart message tha has centralized scheduling number there for her to call so they can coordinate with her schedule to find a better apt.

## 2021-07-13 ENCOUNTER — Ambulatory Visit: Payer: Medicaid Other

## 2021-07-14 LAB — CYTOLOGY - PAP
Comment: NEGATIVE
Diagnosis: UNDETERMINED — AB
High risk HPV: NEGATIVE

## 2021-07-19 ENCOUNTER — Encounter: Payer: Self-pay | Admitting: Obstetrics and Gynecology

## 2021-07-29 ENCOUNTER — Encounter: Payer: Self-pay | Admitting: Obstetrics

## 2021-08-01 ENCOUNTER — Ambulatory Visit
Admission: RE | Admit: 2021-08-01 | Discharge: 2021-08-01 | Disposition: A | Discharge status: Home / Self Care | Payer: Medicaid Other | Source: Ambulatory Visit | Attending: Obstetrics | Admitting: Obstetrics

## 2021-08-01 DIAGNOSIS — N83201 Unspecified ovarian cyst, right side: Secondary | ICD-10-CM | POA: Insufficient documentation

## 2021-08-02 ENCOUNTER — Telehealth: Payer: Self-pay | Admitting: Cardiology

## 2021-08-02 NOTE — Telephone Encounter (Signed)
Called patient and she stated that her BP has been dropping at times to 77/54. She stated that she has lost a lot of weight also. I advised that she hold her Carvedilol if her BP is low prior to taking medication. I scheduled her to see Cadence on 8/9 next week.

## 2021-08-02 NOTE — Telephone Encounter (Signed)
Patient called stating she will need to stop taking one of her BP medications and she will need to know which one to discontinue.

## 2021-08-09 ENCOUNTER — Encounter: Payer: Self-pay | Admitting: Cardiology

## 2021-08-09 ENCOUNTER — Ambulatory Visit (INDEPENDENT_AMBULATORY_CARE_PROVIDER_SITE_OTHER): Payer: Medicaid Other

## 2021-08-09 ENCOUNTER — Ambulatory Visit (INDEPENDENT_AMBULATORY_CARE_PROVIDER_SITE_OTHER): Payer: Medicaid Other | Admitting: Cardiology

## 2021-08-09 ENCOUNTER — Telehealth: Payer: Self-pay | Admitting: Cardiology

## 2021-08-09 VITALS — BP 125/87 | HR 90 | Ht 70.0 in | Wt 173.4 lb

## 2021-08-09 DIAGNOSIS — I959 Hypotension, unspecified: Secondary | ICD-10-CM

## 2021-08-09 DIAGNOSIS — I10 Essential (primary) hypertension: Secondary | ICD-10-CM

## 2021-08-09 DIAGNOSIS — R55 Syncope and collapse: Secondary | ICD-10-CM

## 2021-08-09 MED ORDER — SPIRONOLACTONE 25 MG PO TABS: 12.5000 mg | ORAL_TABLET | Freq: Every day | ORAL | 3 refills | Status: DC

## 2021-08-09 NOTE — Patient Instructions (Signed)
Medication Instructions:  Your physician has recommended you make the following change in your medication:   STOP taking norvasc  DECREASE spironolactone to 12.5 mg daily   *If you need a refill on your cardiac medications before your next appointment, please call your pharmacy*   Lab Work: None ordered  If you have labs (blood work) drawn today and your tests are completely normal, you will receive your results only by: MyChart Message (if you have MyChart) OR A paper copy in the mail If you have any lab test that is abnormal or we need to change your treatment, we will call you to review the results.   Testing/Procedures:  Your provider has ordered a heart monitor to wear for 14 days. This will be mailed to your home with instructions on placement. Once you have finished the time frame requested, you will return monitor in box provided.      Follow-Up: At [facility], you and your health needs are our priority.  As part of our continuing mission to provide you with exceptional heart care, we have created designated Provider Care Teams.  These Care Teams include your primary Cardiologist (physician) and Advanced Practice Providers (APPs -  Physician Assistants and Nurse Practitioners) who all work together to provide you with the care you need, when you need it.  We recommend signing up for the patient portal called "MyChart".  Sign up information is provided on this After Visit Summary.  MyChart is used to connect with patients for Virtual Visits (Telemedicine).  Patients are able to view lab/test results, encounter notes, upcoming appointments, etc.  Non-urgent messages can be sent to your provider as well.   To learn more about what you can do with MyChart, go to [email or web address].    Your next appointment:   6-7 week(s)  The format for your next appointment:   In Person  Provider:   Debbe Odea, MD    Other Instructions N/A  Important Information About  [place]

## 2021-08-09 NOTE — Telephone Encounter (Signed)
Pt called stating she spoke to someone and they told her that Dr. Azucena Cecil had an opening on 08/11/21. I did not see an appt that day but I did inform her that he had a cancellation today at 10:00. She states she couldn't make it today. She states that someone told her her situation is urgent and she needs an appt with cardiology asap. I offered 08/24/21 with Fransico Michael, however she pt would like to speak with a nurse first. Please advise.

## 2021-08-09 NOTE — Progress Notes (Signed)
Cardiology Office Note:    Date:  08/09/2021   ID:  [name], DOB [birth year], MRN [id number]  PCP:  [facility]  Cardiologist:  Debbe Odea, MD  Electrophysiologist:  None   Referring MD: [facility]*   Chief Complaint  Patient presents with   Low Blood Pressure    Low blood pressure. Patient states that she passed out  08-08-2021.Meds reviewed with patient.     History of Present Illness:    Connie Whitney is a 41 y.o. female with a hx of fibromyalgia, hypertension, obesity s/p gastric bypass, arthritis, joint hypermobility, who presents for follow-up due to syncope.   Previously seen due to hypertension, medications were titrated, states having episode of syncope about 1 week ago while at work.  Systolic blood pressures were in the 70s.  She was advised to stop taking carvedilol, blood pressures yesterday showed systolic again in the 70s leading to a fall injuring front.  Currently takes lisinopril, amlodipine, spironolactone.  She had gastric bypass last year, has lost about 35 pounds since last visit.  Previously stop clonidine.  Denies palpitations.  Has appointment with neurology.   Prior notes Echo 10/2018 EF 60 to 65% BiDil was stopped after patient states her blood pressure bottomed out after taking BiDil.  Systolic blood pressure typically in the 90s and slowly go up throughout the day.  She states being on HCTZ before but that was also stopped likely due to low potassium levels.  Hydralazine was previously stopped due to blood pressures becoming very low. She also has a history of hypermobile joints with lumbar vertebrae displacement.  Has been seeing pain management for the past 5 years.  She is concerned she may have Marfan's.   Work-up for secondary causes including renal artery ultrasound, renin aldosterone studies, metanephrine studies, CT abdomen to evaluate adrenal mass have all been unremarkable.  Past Medical History:   Diagnosis Date   Anemia    Arthritis    Atypical chest pain 03/12/2019   Fibromyalgia    Hypertension    Lupus (HCC)    Migraines    Neuropathy    Obesity (BMI 30.0-34.9) 03/12/2019   Palpitations 03/12/2019   Vitamin D deficiency     Past Surgical History:  Procedure Laterality Date   BARIATRIC SURGERY     CESAREAN SECTION     DILATION AND CURETTAGE, DIAGNOSTIC / THERAPEUTIC     ESOPHAGOGASTRODUODENOSCOPY (EGD) WITH PROPOFOL N/A 08/02/2016   Procedure: ESOPHAGOGASTRODUODENOSCOPY (EGD) WITH PROPOFOL;  Surgeon: Scot Jun, MD;  Location: [facility] ENDOSCOPY;  Service: Endoscopy;  Laterality: N/A;   HYSTEROSCOPY WITH NOVASURE N/A 08/21/2014   Procedure: HYSTEROSCOPY WITH NOVASURE;  Surgeon: [place] Bing, MD;  Location: [facility] ORS;  Service: Gynecology;  Laterality: N/A;   TUBAL LIGATION      Current Medications: Current Meds  Medication Sig   AMITIZA 24 MCG capsule Take 24 mcg by mouth 2 (two) times daily.   AMITRIPTYLINE HCL PO Take 10 mg by mouth 1 day or 1 dose. Takes as needed   cyclobenzaprine (FLEXERIL) 10 MG tablet SMARTSIG:1 Tablet(s) By Mouth Every 12 Hours   gabapentin (NEURONTIN) 300 MG capsule Take 3 capsules (900 mg total) by mouth at bedtime.   LINZESS 290 MCG CAPS capsule Take 290 mcg by mouth daily.   lisinopril (ZESTRIL) 20 MG tablet Take 1 tablet (20 mg total) by mouth 2 (two) times daily.   Multiple Vitamin (MULTIVITAMIN) capsule    oxyCODONE-acetaminophen (PERCOCET) 7.5-325 MG tablet Take 1  tablet by mouth every 4 (four) hours as needed for severe pain.   OZEMPIC, 0.25 OR 0.5 MG/DOSE, 2 MG/1.5ML SOPN Inject into the skin.   phentermine 37.5 MG capsule Take 37.5 mg by mouth daily.   [DISCONTINUED] amLODipine (NORVASC) 5 MG tablet Take 0.5 tablet (2.5 mg) by mouth once daily in the evening     Allergies:   Milk (cow), Milk-related compounds, Pravastatin, and Venlafaxine   Social History   Socioeconomic History   Marital status: Single    Spouse name: Not  on file   Number of children: Not on file   Years of education: Not on file   Highest education level: Not on file  Occupational History   Not on file  Tobacco Use   Smoking status: Never   Smokeless tobacco: Never  Vaping Use   Vaping Use: Never used  Substance and Sexual Activity   Alcohol use: No   Drug use: No   Sexual activity: Never  Other Topics Concern   Not on file  Social History Narrative   Not on file   Social Determinants of Health   Financial Resource Strain: Not on file  Food Insecurity: No Food Insecurity (02/27/2019)   Hunger Vital Sign    Worried About Running Out of Food in the Last Year: Never true    Ran Out of Food in the Last Year: Never true  Transportation Needs: No Transportation Needs (02/27/2019)   PRAPARE - Administrator, Civil Service (Medical): No    Lack of Transportation (Non-Medical): No  Physical Activity: Not on file  Stress: Not on file  Social Connections: Not on file     Family History: The patient's family history includes Heart failure in her paternal aunt; Heart murmur in her maternal grandfather and mother; Hyperlipidemia in her father; Hypertension in her father and mother; Stroke in her maternal grandfather.  ROS:   Please see the history of present illness.     All other systems reviewed and are negative.  EKGs/Labs/Other Studies Reviewed:    The following studies were reviewed today:   EKG:  EKG is  ordered today.  The ekg ordered today demonstrates normal sinus rhythm  Recent Labs: No results found for requested labs within last 365 days.  Recent Lipid Panel No results found for: "CHOL", "TRIG", "HDL", "CHOLHDL", "VLDL", "LDLCALC", "LDLDIRECT"  Physical Exam:    VS:  BP 125/87 (BP Location: Left Arm, Patient Position: Sitting, Cuff Size: Normal)   Pulse 90   Ht 5\' 10"  (1.778 m)   Wt 173 lb 6.4 oz (78.7 kg)   SpO2 94%   BMI 24.88 kg/m     Wt Readings from Last 3 Encounters:  08/09/21 173 lb 6.4  oz (78.7 kg)  07/07/21 173 lb 9.6 oz (78.7 kg)  09/27/20 209 lb (94.8 kg)     GEN:  Well nourished, well developed in no acute distress HEENT: Normal NECK: No JVD; No carotid bruits CARDIAC: RRR, 1/6 systolic murmur at the right upper sternal border.  No rubs or gallops. RESPIRATORY:  Clear to auscultation without rales, wheezing or rhonchi  ABDOMEN: Soft, non-tender, non-distended MUSCULOSKELETAL: Patient noted to have hypermobile joints. SKIN: Warm and dry NEUROLOGIC:  Alert and oriented x 3 PSYCHIATRIC:  Normal affect    ASSESSMENT:    1. Syncope and collapse   2. Hypotension, unspecified hypotension type   3. Primary hypertension     PLAN:    Syncope and collapse, likely from hypotension  due to overmedication.  Stop Norvasc, reduce Aldactone to 12.5 mg daily, continue lisinopril 20 mg twice daily.  Low threshold to stop Aldactone if BP stays low.  Advised to check BP daily.  Place cardiac monitor to evaluate any significant arrhythmias, previous echo showed preserved EF no structural abnormalities.   Hypotension likely from overmedication.  Orthostatic vitals in the office today with no evidence for orthostasis. History of hypertension, BP controlled.  Due to syncope, may to let BP run a little high.  Follow-up in 6 weeks.  Medication Adjustments/Labs and Tests Ordered: Current medicines are reviewed at length with the patient today.  Concerns regarding medicines are outlined above.  Orders Placed This Encounter  Procedures   LONG TERM MONITOR (3-14 DAYS)   EKG 12-Lead    Meds ordered this encounter  Medications   spironolactone (ALDACTONE) 25 MG tablet    Sig: Take 0.5 tablets (12.5 mg total) by mouth at bedtime.    Dispense:  45 tablet    Refill:  3     Patient Instructions  Medication Instructions:  Your physician has recommended you make the following change in your medication:   STOP taking norvasc  DECREASE spironolactone to 12.5 mg daily   *If you  need a refill on your cardiac medications before your next appointment, please call your pharmacy*   Lab Work: None ordered  If you have labs (blood work) drawn today and your tests are completely normal, you will receive your results only by: MyChart Message (if you have MyChart) OR A paper copy in the mail If you have any lab test that is abnormal or we need to change your treatment, we will call you to review the results.   Testing/Procedures:  Your provider has ordered a heart monitor to wear for 14 days. This will be mailed to your home with instructions on placement. Once you have finished the time frame requested, you will return monitor in box provided.      Follow-Up: At [facility], you and your health needs are our priority.  As part of our continuing mission to provide you with exceptional heart care, we have created designated Provider Care Teams.  These Care Teams include your primary Cardiologist (physician) and Advanced Practice Providers (APPs -  Physician Assistants and Nurse Practitioners) who all work together to provide you with the care you need, when you need it.  We recommend signing up for the patient portal called "MyChart".  Sign up information is provided on this After Visit Summary.  MyChart is used to connect with patients for Virtual Visits (Telemedicine).  Patients are able to view lab/test results, encounter notes, upcoming appointments, etc.  Non-urgent messages can be sent to your provider as well.   To learn more about what you can do with MyChart, go to [email or web address].    Your next appointment:   6-7 week(s)  The format for your next appointment:   In Person  Provider:   Debbe Odea, MD    Other Instructions N/A  Important Information About Sugar          Signed, Debbe Odea, MD  08/09/2021 5:12 PM    [facility]

## 2021-08-09 NOTE — Telephone Encounter (Signed)
Spoke with the patient. Patient called the office last week to report persistent hypotension. She has lost weight in the last several months and is on several hypertension meds. She was instructed by [facility] to hold carvedilol and f/u appt was scheduled on 08/10/21.  Pt sts that yesterday with out warning she had an syncopal episode. She bruised her face and knocked out two of her teeth. She was evaluated at [facility] ED. Pt sts her BP was 77/48. She was instructed to f/u with her Cardiologist asap. Appt scheduled with her primary cards Dr. Azucena Cecil (DOD) today at 3:20pm.   Patient sts she is currently at the dentist, she will be able to make it to the offered appt. She has not rechecked her BP today.  Adv her to hold her BP meds this morning and to hydrate through our the day. Pt verbalized understanding and voiced appreciation for the assistance.

## 2021-08-10 ENCOUNTER — Ambulatory Visit: Payer: Medicaid Other | Admitting: Medical

## 2021-08-11 ENCOUNTER — Encounter: Payer: Medicaid Other | Admitting: Obstetrics and Gynecology

## 2021-08-15 NOTE — Telephone Encounter (Signed)
Patient seen in office on 08/09/21.

## 2021-08-16 DIAGNOSIS — R55 Syncope and collapse: Secondary | ICD-10-CM | POA: Diagnosis not present

## 2021-08-22 ENCOUNTER — Telehealth: Payer: Self-pay | Admitting: Cardiology

## 2021-08-22 NOTE — Telephone Encounter (Signed)
   Pt said, she had a reaction from the heart monitor adhesive, she did try calling the [employer] and was told they don't have an adhesive for sensitive skin. She needs a different heat monitor

## 2021-08-23 NOTE — Telephone Encounter (Signed)
Called patient and left a detailed VM per DPR on file with the following recommendations:  Agbor-Etang, Arlys John, MD  Gibson Ramp, RN Caller: Unspecified (Yesterday, 11:11 AM) Will not recommend placing another heart monitor due to patient having a reaction from adhesive.  Continue medication/recommendations as discussed during clinic visit.  Okay to mail current monitor as is.  Keep follow-up appointment.   Also sent Patient a MyChart message with recommendations.

## 2021-08-24 NOTE — Progress Notes (Unsigned)
    GYNECOLOGY PROGRESS NOTE  Subjective:    Patient ID: Connie Whitney, female    DOB: Oct 09, 1980, 41 y.o.   MRN: [id number]  HPI  Patient is a 41 y.o. G77P1021 female who presents for consultation to have ablation repeated.  {Common ambulatory SmartLinks:19316}  Review of Systems {ros; complete:30496}   Objective:   There were no vitals taken for this visit. There is no height or weight on file to calculate BMI. General appearance: {general exam:16600} Abdomen: {abdominal exam:16834} Pelvic: {pelvic exam:16852::"cervix normal in appearance","external genitalia normal","no adnexal masses or tenderness","no cervical motion tenderness","rectovaginal septum normal","uterus normal size, shape, and consistency","vagina normal without discharge"} Extremities: {extremity exam:5109} Neurologic: {neuro exam:17854}   Assessment:   No diagnosis found.   Plan:   There are no diagnoses linked to this encounter.

## 2021-08-25 ENCOUNTER — Ambulatory Visit (INDEPENDENT_AMBULATORY_CARE_PROVIDER_SITE_OTHER): Payer: Medicaid Other | Admitting: Obstetrics and Gynecology

## 2021-08-25 ENCOUNTER — Encounter: Payer: Self-pay | Admitting: Obstetrics and Gynecology

## 2021-08-25 VITALS — BP 138/95 | HR 93 | Ht 70.0 in | Wt 164.0 lb

## 2021-08-25 DIAGNOSIS — Z9889 Other specified postprocedural states: Secondary | ICD-10-CM | POA: Diagnosis not present

## 2021-08-25 DIAGNOSIS — I10 Essential (primary) hypertension: Secondary | ICD-10-CM

## 2021-08-25 DIAGNOSIS — R55 Syncope and collapse: Secondary | ICD-10-CM | POA: Diagnosis not present

## 2021-08-25 DIAGNOSIS — N939 Abnormal uterine and vaginal bleeding, unspecified: Secondary | ICD-10-CM

## 2021-08-25 NOTE — Patient Instructions (Signed)
Hysterectomy Information °A hysterectomy is a surgery in which the uterus is removed. The lowest part of the uterus (cervix), which opens into the vagina, may be removed as well. In some cases, the fallopian tubes, the ovaries,  or both the fallopian tubes and the ovaries may also be removed. °This procedure may be done to treat different medical problems. It may also be done to help transgender men feel more masculine. After the procedure, a woman will no longer have menstrual periods and will not be able to become pregnant (sterile). °What are the reasons for a hysterectomy? °There are many reasons why a person might have this procedure. They include: °Persistent, abnormal vaginal bleeding. °Long-term (chronic) pelvic pain or infection. °Endometriosis. This is when the lining of the uterus (endometrium) starts to grow outside the uterus. °Adenomyosis. This is when the endometrium starts to grow in the muscle of the uterus. °Pelvic organ prolapse. This is a condition in which the uterus falls down into the vagina. °Noncancerous growths in the uterus (uterine fibroids) that cause symptoms. °The presence of precancerous cells. °Cervical or uterine cancer. °Sex change. This helps a transgender man complete his female identity. °What are the different types of hysterectomy? °There are three different types of hysterectomy: °Supracervical hysterectomy. In this type, the top part of the uterus is removed, but not the cervix. °Total hysterectomy. In this type, the uterus and cervix are removed. °Radical hysterectomy. In this type, the uterus, the cervix, and the tissue that holds the uterus in place (parametrium) are removed. °What are the different ways a hysterectomy can be performed? °There are many different ways a hysterectomy can be performed, including: °Abdominal hysterectomy. In this type, an incision is made in the abdomen. The uterus is removed through this incision. °Vaginal hysterectomy. In this type, an  incision is made in the vagina. The uterus is removed through this incision. There are no abdominal incisions. °Conventional laparoscopic hysterectomy. In this type, 3 or 4 small incisions are made in the abdomen. A thin, lighted tube with a camera (laparoscope) is inserted into one of the incisions. Other tools are put through the other incisions. The uterus is cut into small pieces. The small pieces are removed through the incisions or the vagina. °Laparoscopically assisted vaginal hysterectomy (LAVH). In this type, 3 or 4 small incisions are made in the abdomen. Part of the surgery is performed laparoscopically and the other part is done vaginally. The uterus is removed through the vagina. °Robot-assisted laparoscopic hysterectomy. In this type, a laparoscope and other tools are inserted into 3 or 4 small incisions in the abdomen. A computer-controlled device is used to give the surgeon a 3D image and to help control the surgical instruments. This allows for more precise movements of surgical instruments. The uterus is cut into small pieces and removed through the incisions or the vagina. °Discuss the options with your health care provider to determine which type is the right one for you. °What are the risks of this surgery? °Generally, this is a safe procedure. However, problems may occur, including: °Bleeding and risk of blood transfusion. Tell your health care provider if you do not want to receive any blood products. °Blood clots in the legs or lung. °Infection. °Damage to nearby structures or organs. °Allergic reactions to medicines. °Having to change to an abdominal hysterectomy after starting a less invasive technique. °What to expect after a hysterectomy °You will be given pain medicine. °You may need to stay in the hospital for 1-2 days   to recover, depending on the type of hysterectomy you had. °You will need to have someone with you for the first 3-5 days after you go home. °You will need to follow up  with your surgeon in 2-4 weeks after surgery to evaluate your progress. °If the ovaries are removed, you will have early menopause symptoms such as hot flashes, night sweats, and insomnia. °If you had a hysterectomy for a problem that was not cancer or a condition that could not lead to cancer, then you no longer need Pap tests. However, even if you no longer need a Pap test, get regular pelvic exams to make sure no other problems are developing. °Questions to ask your health care provider °Is a hysterectomy medically necessary? Do I have other treatment options for my condition? °What are my options for hysterectomy procedure? °What organs and tissues need to be removed? °What are the risks? °What are the benefits? °How long will I need to stay in the hospital after the procedure? °How long will I need to recover at home? °What symptoms can I expect after the procedure? °Summary °A hysterectomy is a surgery in which the uterus is removed. The fallopian tubes, the ovaries, or both may be removed as well. °This procedure may be done to treat different medical problems. It may also be done to complete your female identity during a sex change. °After the procedure, a woman will no longer have a menstrual period and will not be able to become pregnant. °There are three types of hysterectomy. Discuss with your health care provider which options are right for you. °This is a safe procedure, though there are some risks. Risks include infection, bleeding, blood clots, or damage to nearby organs. °This information is not intended to replace advice given to you by your health care provider. Make sure you discuss any questions you have with your health care provider. °Document Revised: 10/18/2018 Document Reviewed: 10/18/2018 °Elsevier Patient Education © 2021 [employer] Inc. ° °

## 2021-09-09 ENCOUNTER — Other Ambulatory Visit: Payer: Self-pay

## 2021-09-09 MED ORDER — SPIRONOLACTONE 25 MG PO TABS: 25.0000 mg | ORAL_TABLET | Freq: Every day | ORAL | 0 refills | Status: DC

## 2021-09-09 NOTE — Progress Notes (Signed)
See MyChart message for Dose increase of Spronolactone to 25 MG QD.

## 2021-09-16 ENCOUNTER — Ambulatory Visit: Payer: Medicaid Other | Admitting: Medical

## 2021-09-22 NOTE — Progress Notes (Signed)
    GYNECOLOGY PROGRESS NOTE  Subjective:    Patient ID: Connie Whitney, female    DOB: July 28, 1980, 41 y.o.   MRN: [id number]  HPI  Patient is a 41 y.o. G18P1021 female who presents for endometrial biopsy for abnormal uterine bleeding. Patient with resumption of bleeding after endometrial ablation performed several years ago. Debating between repeating endometrial ablation and hysterectomy. Notes that she would like to have surgery around the new year once she decides.   Additionally, she notes negative workup thus far with Neurology for her new onset syncopal episodes. Still awaiting brain MRI.  Has also been evaluated by Cardiology which was negative.   They note that she may have been having them due to her BP meds not being adjusted as she has lost significant weight and may have been hypotensive. Have stopped all of her meds, but now her BPs are elevating again.    The following portions of the patient's history were reviewed and updated as appropriate: allergies, current medications, past family history, past medical history, past social history, past surgical history, and problem list.  Review of Systems Pertinent items are noted in HPI.   Objective:   Blood pressure (!) 159/102, pulse 90, resp. rate 16, height 5\' 10"  (1.778 m), weight 170 lb 4.8 oz (77.2 kg). Body mass index is 24.44 kg/m.  General appearance: alert, cooperative, and no distress Abdomen: soft, non-tender; bowel sounds normal; no masses,  no organomegaly Pelvic: external genitalia normal, rectovaginal septum normal.  Vagina with small amount of white thin discharge.  Cervix normal appearing, no lesions and no motion tenderness.  Uterus mobile, nontender, normal shape and size.  Adnexae non-palpable, nontender bilaterally.  Extremities: extremities normal, atraumatic, no cyanosis or edema Neurologic: Grossly normal   Assessment:   1. Abnormal uterine bleeding   2. History of endometrial ablation      Plan:    Abnormal uterine bleeding - Patient still debating between repeat ablation and hysterectomy ,but leaning more towards hysterectomy.  - Endometrial biopsy performed today for workup (see procedure note below).  - Discussed use of medication such as Aygestin if bleeding becomes worse, until surgery occurs.   History of endometrial ablation - Understands risks of repeat ablation (vs hysterectomy).  - May have risk of low tissue sampling on biopsy due to h/o ablation.   Endometrial Biopsy Procedure Note  The patient is positioned on the exam table in the dorsal lithotomy position. Bimanual exam confirms uterine position and size. A Graves speculum is placed into the vagina. A single toothed tenaculum is placed onto the anterior lip of the cervix. Cervical dilation performed as pipette unable to be passed through cervical canal due to stenosis. The pipette is then placed into the endocervical canal and is advanced to the uterine fundus. Using a piston like technique, with vacuum created by withdrawing the stylus, the endometrial specimen is obtained and transferred to the biopsy container. Minimal bleeding is encountered. The procedure is well tolerated.   Uterine Position:mid    Uterine Length:  6 cm   Uterine Specimen: Scant   Post procedure instructions are given. The patient is scheduled for follow up appointment.   Rubie Maid, MD [facility]

## 2021-09-23 ENCOUNTER — Other Ambulatory Visit (HOSPITAL_COMMUNITY)
Admission: RE | Admit: 2021-09-23 | Discharge: 2021-09-23 | Disposition: A | Discharge status: Home / Self Care | Payer: Medicaid Other | Source: Ambulatory Visit | Attending: Obstetrics and Gynecology | Admitting: Obstetrics and Gynecology

## 2021-09-23 ENCOUNTER — Encounter: Payer: Self-pay | Admitting: Obstetrics and Gynecology

## 2021-09-23 ENCOUNTER — Ambulatory Visit (INDEPENDENT_AMBULATORY_CARE_PROVIDER_SITE_OTHER): Payer: Medicaid Other | Admitting: Obstetrics and Gynecology

## 2021-09-23 VITALS — BP 159/102 | HR 90 | Resp 16 | Ht 70.0 in | Wt 170.3 lb

## 2021-09-23 DIAGNOSIS — Z9889 Other specified postprocedural states: Secondary | ICD-10-CM | POA: Insufficient documentation

## 2021-09-23 DIAGNOSIS — N939 Abnormal uterine and vaginal bleeding, unspecified: Secondary | ICD-10-CM | POA: Insufficient documentation

## 2021-09-23 NOTE — Patient Instructions (Signed)

## 2021-09-23 NOTE — Addendum Note (Signed)
Addended by: Chilton Greathouse on: 09/23/2021 09:48 AM   Modules accepted: Orders

## 2021-09-26 LAB — SURGICAL PATHOLOGY

## 2021-10-03 ENCOUNTER — Encounter: Payer: Self-pay | Admitting: Obstetrics and Gynecology

## 2021-10-06 ENCOUNTER — Encounter: Payer: Self-pay | Admitting: Cardiology

## 2021-10-06 ENCOUNTER — Ambulatory Visit: Payer: Medicaid Other | Attending: Cardiology | Admitting: Cardiology

## 2021-10-06 VITALS — BP 136/98 | HR 84 | Ht 70.0 in | Wt 170.4 lb

## 2021-10-06 DIAGNOSIS — I10 Essential (primary) hypertension: Secondary | ICD-10-CM

## 2021-10-06 NOTE — Patient Instructions (Signed)
Medication Instructions:   Your physician recommends that you continue on your current medications as directed. Please refer to the Current Medication list given to you today.   *If you need a refill on your cardiac medications before your next appointment, please call your pharmacy*    Follow-Up: At [facility], you and your health needs are our priority.  As part of our continuing mission to provide you with exceptional heart care, we have created designated Provider Care Teams.  These Care Teams include your primary Cardiologist (physician) and Advanced Practice Providers (APPs -  Physician Assistants and Nurse Practitioners) who all work together to provide you with the care you need, when you need it.  We recommend signing up for the patient portal called "MyChart".  Sign up information is provided on this After Visit Summary.  MyChart is used to connect with patients for Virtual Visits (Telemedicine).  Patients are able to view lab/test results, encounter notes, upcoming appointments, etc.  Non-urgent messages can be sent to your provider as well.   To learn more about what you can do with MyChart, go to [email or web address].    Your next appointment:   6 month(s)  The format for your next appointment:   In Person  Provider:   Brian Agbor-Etang, MD    Other Instructions    Important Information About Sugar       

## 2021-10-06 NOTE — Progress Notes (Signed)
Cardiology Office Note:    Date:  10/06/2021   ID:  Connie Whitney, DOB 1980-10-12, MRN [id number]  PCP:  [facility], [place]  Cardiologist:  Kate Sable, MD  Electrophysiologist:  None   Referring MD: [facility], [place]   Chief Complaint  Patient presents with   Follow-up    6-7 week f/u, no new cardiac concerns no syncope episodes     History of Present Illness:    Connie Whitney is a 41 y.o. female with a hx of fibromyalgia, hypertension, obesity s/p gastric bypass, arthritis, joint hypermobility, who presents for follow-up for hypertension.  Previously seen due to a syncopal episode from low blood pressures/overmedication after gastric bypass.  Norvasc was stopped.  Aldactone previously reduced but BPs became elevated. currently takes Aldactone 25 mg daily, lisinopril 20 mg twice daily.  She has not had any further episodes of syncope, blood pressures have been well controlled.  Compliant with medications as prescribed.  Prior notes Echo 10/2018 EF 60 to 65% BiDil was stopped after patient states her blood pressure bottomed out after taking BiDil.  Systolic blood pressure typically in the 90s and slowly go up throughout the day.  She states being on HCTZ before but that was also stopped likely due to low potassium levels.  Hydralazine was previously stopped due to blood pressures becoming very low. She also has a history of hypermobile joints with lumbar vertebrae displacement.  Has been seeing pain management for the past 5 years.  She is concerned she may have Marfan's.   Work-up for secondary causes including renal artery ultrasound, renin aldosterone studies, metanephrine studies, CT abdomen to evaluate adrenal mass have all been unremarkable.  Past Medical History:  Diagnosis Date   Anemia    Arthritis    Atypical chest pain 03/12/2019   Fibromyalgia    Hypertension    Lupus (HCC)    Migraines    Neuropathy    Obesity (BMI 30.0-34.9) 03/12/2019    Palpitations 03/12/2019   Vitamin D deficiency     Past Surgical History:  Procedure Laterality Date   BARIATRIC SURGERY     CESAREAN SECTION     DILATION AND CURETTAGE, DIAGNOSTIC / THERAPEUTIC     ESOPHAGOGASTRODUODENOSCOPY (EGD) WITH PROPOFOL N/A 08/02/2016   Procedure: ESOPHAGOGASTRODUODENOSCOPY (EGD) WITH PROPOFOL;  Surgeon: Manya Silvas, MD;  Location[facility] ENDOSCOPY;  Service: Endoscopy;  Laterality: N/A;   HYSTEROSCOPY WITH NOVASURE N/A 08/21/2014   Procedure: HYSTEROSCOPY WITH NOVASURE;  Surgeon: Aletha Halim, MD;  Location: [facility] ORS;  Service: Gynecology;  Laterality: N/A;   TUBAL LIGATION      Current Medications: Current Meds  Medication Sig   AMITIZA 24 MCG capsule Take 24 mcg by mouth 2 (two) times daily.   AMITRIPTYLINE HCL PO Take 10 mg by mouth 1 day or 1 dose. Takes as needed   cyclobenzaprine (FLEXERIL) 10 MG tablet SMARTSIG:1 Tablet(s) By Mouth Every 12 Hours   gabapentin (NEURONTIN) 300 MG capsule Take 3 capsules (900 mg total) by mouth at bedtime.   LINZESS 290 MCG CAPS capsule Take 290 mcg by mouth daily.   lisinopril (ZESTRIL) 20 MG tablet Take 1 tablet (20 mg total) by mouth 2 (two) times daily.   Multiple Vitamin (MULTIVITAMIN) capsule    oxyCODONE-acetaminophen (PERCOCET) 7.5-325 MG tablet Take 1 tablet by mouth every 4 (four) hours as needed for severe pain.   OZEMPIC, 0.25 OR 0.5 MG/DOSE, 2 MG/1.5ML SOPN Inject 1 mg into the skin once a week.   phentermine  37.5 MG capsule Take 37.5 mg by mouth daily.   spironolactone (ALDACTONE) 25 MG tablet Take 1 tablet (25 mg total) by mouth at bedtime.     Allergies:   Milk (cow), Milk-related compounds, Pravastatin, and Venlafaxine   Social History   Socioeconomic History   Marital status: Single    Spouse name: Not on file   Number of children: Not on file   Years of education: Not on file   Highest education level: Not on file  Occupational History   Not on file  Tobacco Use   Smoking status:  Never   Smokeless tobacco: Never  Vaping Use   Vaping Use: Never used  Substance and Sexual Activity   Alcohol use: No   Drug use: No   Sexual activity: Never  Other Topics Concern   Not on file  Social History Narrative   Not on file   Social Determinants of Health   Financial Resource Strain: Not on file  Food Insecurity: No Food Insecurity (02/27/2019)   Hunger Vital Sign    Worried About Running Out of Food in the Last Year: Never true    Ran Out of Food in the Last Year: Never true  Transportation Needs: No Transportation Needs (02/27/2019)   PRAPARE - Hydrologist (Medical): No    Lack of Transportation (Non-Medical): No  Physical Activity: Not on file  Stress: Not on file  Social Connections: Not on file     Family History: The patient's family history includes Heart failure in her paternal aunt; Heart murmur in her maternal grandfather and mother; Hyperlipidemia in her father; Hypertension in her father and mother; Stroke in her maternal grandfather.  ROS:   Please see the history of present illness.     All other systems reviewed and are negative.  EKGs/Labs/Other Studies Reviewed:    The following studies were reviewed today:   EKG:  EKG not ordered today.    Recent Labs: No results found for requested labs within last 365 days.  Recent Lipid Panel No results found for: "CHOL", "TRIG", "HDL", "CHOLHDL", "VLDL", "LDLCALC", "LDLDIRECT"  Physical Exam:    VS:  BP (!) 136/98 (BP Location: Left Arm, Patient Position: Sitting, Cuff Size: Normal)   Pulse 84   Ht 5\' 10"  (1.778 m)   Wt 170 lb 6.4 oz (77.3 kg)   SpO2 100%   BMI 24.45 kg/m     Wt Readings from Last 3 Encounters:  10/06/21 170 lb 6.4 oz (77.3 kg)  09/23/21 170 lb 4.8 oz (77.2 kg)  08/25/21 164 lb (74.4 kg)     GEN:  Well nourished, well developed in no acute distress HEENT: Normal NECK: No JVD; No carotid bruits CARDIAC: RRR RESPIRATORY:  Clear to  auscultation without rales, wheezing or rhonchi  ABDOMEN: Soft, non-tender, non-distended MUSCULOSKELETAL: Patient noted to have hypermobile joints. SKIN: Warm and dry NEUROLOGIC:  Alert and oriented x 3 PSYCHIATRIC:  Normal affect    ASSESSMENT:    1. Primary hypertension     PLAN:    Hypertension, blood pressure controlled, no further episodes of syncope.  Continue lisinopril 20 mg twice daily, spironolactone 25 mg nightly.  Follow-up in 6 months.  Medication Adjustments/Labs and Tests Ordered: Current medicines are reviewed at length with the patient today.  Concerns regarding medicines are outlined above.  No orders of the defined types were placed in this encounter.   No orders of the defined types were placed in this  encounter.    Patient Instructions  Medication Instructions:   Your physician recommends that you continue on your current medications as directed. Please refer to the Current Medication list given to you today.   *If you need a refill on your cardiac medications before your next appointment, please call your pharmacy*    Follow-Up: At [facility], you and your health needs are our priority.  As part of our continuing mission to provide you with exceptional heart care, we have created designated Provider Care Teams.  These Care Teams include your primary Cardiologist (physician) and Advanced Practice Providers (APPs -  Physician Assistants and Nurse Practitioners) who all work together to provide you with the care you need, when you need it.  We recommend signing up for the patient portal called "MyChart".  Sign up information is provided on this After Visit Summary.  MyChart is used to connect with patients for Virtual Visits (Telemedicine).  Patients are able to view lab/test results, encounter notes, upcoming appointments, etc.  Non-urgent messages can be sent to your provider as well.   To learn more about what you can do with MyChart, go to  [email or web address].    Your next appointment:   6 month(s)  The format for your next appointment:   In Person  Provider:   Kate Sable, MD    Other Instructions    Important Information About Sugar         Signed, Kate Sable, MD  10/06/2021 9:28 AM    [place]

## 2021-10-31 ENCOUNTER — Ambulatory Visit: Payer: Medicaid Other | Admitting: Dermatology

## 2021-11-22 ENCOUNTER — Other Ambulatory Visit: Payer: Self-pay

## 2021-11-22 DIAGNOSIS — I10 Essential (primary) hypertension: Secondary | ICD-10-CM

## 2021-11-22 MED ORDER — LISINOPRIL 20 MG PO TABS: 20.0000 mg | ORAL_TABLET | Freq: Two times a day (BID) | ORAL | 1 refills | Status: DC

## 2021-12-22 ENCOUNTER — Other Ambulatory Visit: Payer: Self-pay

## 2021-12-22 MED ORDER — SPIRONOLACTONE 25 MG PO TABS: 25.0000 mg | ORAL_TABLET | Freq: Every day | ORAL | 0 refills | Status: DC

## 2022-01-20 ENCOUNTER — Telehealth: Payer: Self-pay | Admitting: Cardiology

## 2022-01-20 NOTE — Telephone Encounter (Signed)
Patient c/o Palpitations:  High priority if patient c/o lightheadedness, shortness of breath, or chest pain  How long have you had palpitations/irregular HR/ Afib? Are you having the symptoms now?  Patient called to inform Dr. Garen Lah of cardiac event that occurred last night. Tachycardia/PVC's last night, no symptoms currently  Are you currently experiencing lightheadedness, SOB or CP?  No   Do you have a history of afib (atrial fibrillation) or irregular heart rhythm?  Yes   Have you checked your BP or HR? (document readings if available):  Last night HR 122-186,  BP remained consistent 137/81 Has not checked today, but no symptoms today  Are you experiencing any other symptoms?  No

## 2022-01-20 NOTE — Telephone Encounter (Signed)
I spoke with the patient. She states she was asleep last night and woke up about 8:30pm. After she woke up she felt some pressure in her chest and then her heart started to race with HR's of 122-187 bpm.  BP was 137/81.  Her episode lasted ~ 30 minutes. She did try drinking cold water, breathing techniques, and bearing down although she did not feel these helped.   The patient advised her last episode like this was ~ 4 years ago.  She denies any recent change in diet/ caffeine/ medications. She did start a new job this week working 7p-7a.   BP's at home have been running 119/78-135/80 on average.  The patient wore a monitor back in 09/2021 with some PSVT noted.  I have advised the patient I will forward to Dr. Garen Lah to review and advise further, but since her episode was brief and the last time this happened was 4 years ago, she has been advised to continue to monitor symptoms and let us know if these start to occur more frequently or last longer for her.   She is aware we will call back with any further MD recommendations.  The patient voices understanding and is agreeable.

## 2022-01-23 NOTE — Telephone Encounter (Signed)
Pt made aware of MD's recommendations and verbalized understanding.    Kate Sable, MD  Emily Filbert, RN; Engelhard, Kennis Carina, CMA; C[facility] Triage3 days ago    Continue to monitor symptoms for now. If symptoms recur or persist, will consider cardiac monitor.

## 2022-01-25 ENCOUNTER — Ambulatory Visit: Payer: Medicaid Other | Admitting: Dermatology

## 2022-06-01 ENCOUNTER — Other Ambulatory Visit: Payer: Self-pay

## 2022-06-01 DIAGNOSIS — I10 Essential (primary) hypertension: Secondary | ICD-10-CM

## 2022-06-01 MED ORDER — SPIRONOLACTONE 25 MG PO TABS: 25.0000 mg | ORAL_TABLET | Freq: Every day | ORAL | 0 refills | Status: DC

## 2022-06-01 MED ORDER — LISINOPRIL 20 MG PO TABS: 20.0000 mg | ORAL_TABLET | Freq: Two times a day (BID) | ORAL | 0 refills | Status: DC

## 2022-07-25 ENCOUNTER — Other Ambulatory Visit: Payer: Self-pay

## 2022-07-25 DIAGNOSIS — I10 Essential (primary) hypertension: Secondary | ICD-10-CM

## 2022-07-25 MED ORDER — LISINOPRIL 20 MG PO TABS: 20.0000 mg | ORAL_TABLET | Freq: Two times a day (BID) | ORAL | 0 refills | Status: DC

## 2022-07-25 NOTE — Telephone Encounter (Signed)
Requested Prescriptions   Signed Prescriptions Disp Refills   lisinopril (ZESTRIL) 20 MG tablet 60 tablet 0    Sig: Take 1 tablet (20 mg total) by mouth 2 (two) times daily. Overdue for follow up. PLEASE CALL OFFICE TO SCHEDULE APPOINTMENT PRIOR TO NEXT REFILL    Authorizing Provider: Debbe Odea    Ordering User: Guerry Minors

## 2022-07-25 NOTE — Telephone Encounter (Signed)
last visit 10/11/21 with plan to f/u 6 months no f/u scheduled at this time.  Will send 30 day rx requesting pt to call office for appt.

## 2022-08-28 ENCOUNTER — Other Ambulatory Visit: Payer: Self-pay

## 2022-08-28 DIAGNOSIS — I10 Essential (primary) hypertension: Secondary | ICD-10-CM

## 2022-08-28 MED ORDER — LISINOPRIL 20 MG PO TABS: 20.0000 mg | ORAL_TABLET | Freq: Two times a day (BID) | ORAL | 0 refills | Status: DC

## 2022-09-28 ENCOUNTER — Ambulatory Visit: Payer: BC Managed Care – PPO | Attending: Cardiology | Admitting: Cardiology

## 2022-09-28 ENCOUNTER — Encounter: Payer: Self-pay | Admitting: Cardiology

## 2022-09-28 VITALS — BP 170/110 | HR 78 | Ht 71.0 in | Wt 180.6 lb

## 2022-09-28 DIAGNOSIS — I10 Essential (primary) hypertension: Secondary | ICD-10-CM | POA: Diagnosis not present

## 2022-09-28 MED ORDER — LISINOPRIL 20 MG PO TABS: 20.0000 mg | ORAL_TABLET | Freq: Two times a day (BID) | ORAL | 3 refills | Status: AC

## 2022-09-28 NOTE — Progress Notes (Signed)
Cardiology Office Note:    Date:  09/28/2022   ID:  Connie Whitney, DOB 11/24/1980, MRN [id number]  PCP:  [facility]  Cardiologist:  Debbe Odea, MD  Electrophysiologist:  None   Referring MD: [facility], [facility]*   Chief Complaint  Patient presents with   Follow-up    Patient denies new or acute cardiac problems/concerns today.  Elevated bp on office check today and patient reports medication compliance.      History of Present Illness:    Connie Whitney is a 42 y.o. female with a hx of fibromyalgia, hypertension, obesity s/p gastric bypass, arthritis, joint hypermobility, who presents for follow-up for hypertension.  Her blood pressures have been adequately controlled with systolic usually in the 120s at home.  She has had previous episodes of hypotension and falls leading to tooth dislocation.  Has not had any episodes since medications were last adjusted about a year ago.  Compliant with medications.  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  Prior notes Echo 10/2018 EF 60 to 65% BiDil was stopped after patient states her blood pressure bottomed out after taking BiDil.  Systolic blood pressure typically in the 90s and slowly go up throughout the day.  She states being on HCTZ before but that was also stopped likely due to low potassium levels.  Hydralazine was previously stopped due to blood pressures becoming very low. She also has a history of hypermobile joints with lumbar vertebrae displacement.  Has been seeing pain management for the past 5 years.  She is concerned she may have Marfan's.   Work-up for secondary causes including renal artery ultrasound, renin aldosterone studies, metanephrine studies, CT abdomen to evaluate adrenal mass have all been unremarkable.  Past Medical History:  Diagnosis Date   Anemia    Arthritis    Atypical chest pain 03/12/2019   Fibromyalgia     Hypertension    Lupus (HCC)    Migraines    Neuropathy    Obesity (BMI 30.0-34.9) 03/12/2019   Palpitations 03/12/2019   Vitamin D deficiency     Past Surgical History:  Procedure Laterality Date   BARIATRIC SURGERY     CESAREAN SECTION     DILATION AND CURETTAGE, DIAGNOSTIC / THERAPEUTIC     ESOPHAGOGASTRODUODENOSCOPY (EGD) WITH PROPOFOL N/A 08/02/2016   Procedure: ESOPHAGOGASTRODUODENOSCOPY (EGD) WITH PROPOFOL;  Surgeon: Scot Jun, MD;  Location: [facility] ENDOSCOPY;  Service: Endoscopy;  Laterality: N/A;   HYSTEROSCOPY WITH NOVASURE N/A 08/21/2014   Procedure: HYSTEROSCOPY WITH NOVASURE;  Surgeon: [place] Bing, MD;  Location: [facility] ORS;  Service: Gynecology;  Laterality: N/A;   TUBAL LIGATION      Current Medications: Current Meds  Medication Sig   AMITIZA 24 MCG capsule Take 24 mcg by mouth 2 (two) times daily.   AMITRIPTYLINE HCL PO Take 10 mg by mouth 1 day or 1 dose. Takes as needed   cyclobenzaprine (FLEXERIL) 10 MG tablet SMARTSIG:1 Tablet(s) By Mouth Every 12 Hours   gabapentin (NEURONTIN) 300 MG capsule Take 3 capsules (900 mg total) by mouth at bedtime.   LINZESS 290 MCG CAPS capsule Take 290 mcg by mouth daily.   MOUNJARO 5 MG/0.5ML Pen Inject 5 mg into the skin once a week.   Multiple Vitamin (MULTIVITAMIN) capsule    oxyCODONE-acetaminophen (PERCOCET) 7.5-325 MG tablet Take 1 tablet by mouth every 4 (four) hours as needed for severe pain.   phentermine 37.5 MG  capsule Take 37.5 mg by mouth daily.   spironolactone (ALDACTONE) 25 MG tablet Take 1 tablet (25 mg total) by mouth at bedtime. Please call [phone] to schedule an appointment for further refills. Thank you.   [DISCONTINUED] lisinopril (ZESTRIL) 20 MG tablet Take 1 tablet (20 mg total) by mouth 2 (two) times daily. Overdue for follow up. PLEASE CALL OFFICE TO SCHEDULE APPOINTMENT PRIOR TO NEXT REFILL     Allergies:   Milk (cow), Milk-related compounds, Pravastatin, and Venlafaxine   Social History    Socioeconomic History   Marital status: Single    Spouse name: Not on file   Number of children: Not on file   Years of education: Not on file   Highest education level: Not on file  Occupational History   Not on file  Tobacco Use   Smoking status: Never   Smokeless tobacco: Never  Vaping Use   Vaping status: Never Used  Substance and Sexual Activity   Alcohol use: No   Drug use: No   Sexual activity: Never  Other Topics Concern   Not on file  Social History Narrative   Not on file   Social Determinants of Health   Financial Resource Strain: Not on file  Food Insecurity: No Food Insecurity (02/27/2019)   Hunger Vital Sign    Worried About Running Out of Food in the Last Year: Never true    Ran Out of Food in the Last Year: Never true  Transportation Needs: No Transportation Needs (02/27/2019)   PRAPARE - Administrator, Civil Service (Medical): No    Lack of Transportation (Non-Medical): No  Physical Activity: Not on file  Stress: Not on file  Social Connections: Unknown (05/06/2021)   Received from [facility]   Social Network    Social Network: Not on file     Family History: The patient's family history includes Heart failure in her paternal aunt; Heart murmur in her maternal grandfather and mother; Hyperlipidemia in her father; Hypertension in her father and mother; Stroke in her maternal grandfather.  ROS:   Please see the history of present illness.     All other systems reviewed and are negative.  EKGs/Labs/Other Studies Reviewed:    The following studies were reviewed today:   EKG Interpretation Date/Time:  Thursday September 28 2022 10:02:28 EDT Ventricular Rate:  78 PR Interval:  140 QRS Duration:  84 QT Interval:  378 QTC Calculation: 430 R Axis:   -12  Text Interpretation: Normal sinus rhythm Normal ECG Confirmed by Debbe Odea ([id number]) on 09/28/2022 10:12:41 AM    Recent Labs: No results found for  requested labs within last 365 days.  Recent Lipid Panel No results found for: "CHOL", "TRIG", "HDL", "CHOLHDL", "VLDL", "LDLCALC", "LDLDIRECT"  Physical Exam:    VS:  BP (!) 170/110 (BP Location: Left Arm, Patient Position: Sitting, Cuff Size: Large)   Pulse 78   Ht 5\' 11"  (1.803 m)   Wt 180 lb 9.6 oz (81.9 kg)   SpO2 99%   BMI 25.19 kg/m     Wt Readings from Last 3 Encounters:  09/28/22 180 lb 9.6 oz (81.9 kg)  10/06/21 170 lb 6.4 oz (77.3 kg)  09/23/21 170 lb 4.8 oz (77.2 kg)     GEN:  Well nourished, well developed in no acute distress HEENT: Normal NECK: No JVD; No carotid bruits CARDIAC: RRR RESPIRATORY:  Clear to auscultation without rales, wheezing or rhonchi  ABDOMEN: Soft, non-tender, non-distended MUSCULOSKELETAL: Patient  noted to have hypermobile joints. SKIN: Warm and dry NEUROLOGIC:  Alert and oriented x 3 PSYCHIATRIC:  Normal affect    ASSESSMENT:    1. Primary hypertension   2. Essential hypertension     PLAN:    Hypertension, blood pressure elevated today, usually controlled, no further episodes of dizziness, hypotension or syncope.  Continue lisinopril 20 mg twice daily, spironolactone 25 mg nightly.  Will avoid increasing any medications at this point.  Rather patient's blood pressure run occasionally high due to previous history of significant hypotension leading to falls.  Follow-up in 12 months.  Medication Adjustments/Labs and Tests Ordered: Current medicines are reviewed at length with the patient today.  Concerns regarding medicines are outlined above.  Orders Placed This Encounter  Procedures   EKG 12-Lead    Meds ordered this encounter  Medications   lisinopril (ZESTRIL) 20 MG tablet    Sig: Take 1 tablet (20 mg total) by mouth 2 (two) times daily.    Dispense:  270 tablet    Refill:  3     Patient Instructions  Medication Instructions:   Your physician recommends that you continue on your current medications as directed. Please  refer to the Current Medication list given to you today.   *If you need a refill on your cardiac medications before your next appointment, please call your pharmacy*   Lab Work:  None Ordered  If you have labs (blood work) drawn today and your tests are completely normal, you will receive your results only by: MyChart Message (if you have MyChart) OR A paper copy in the mail If you have any lab test that is abnormal or we need to change your treatment, we will call you to review the results.   Testing/Procedures:  None Ordered   Follow-Up: At [facility], you and your health needs are our priority.  As part of our continuing mission to provide you with exceptional heart care, we have created designated Provider Care Teams.  These Care Teams include your primary Cardiologist (physician) and Advanced Practice Providers (APPs -  Physician Assistants and Nurse Practitioners) who all work together to provide you with the care you need, when you need it.  We recommend signing up for the patient portal called "MyChart".  Sign up information is provided on this After Visit Summary.  MyChart is used to connect with patients for Virtual Visits (Telemedicine).  Patients are able to view lab/test results, encounter notes, upcoming appointments, etc.  Non-urgent messages can be sent to your provider as well.   To learn more about what you can do with MyChart, go to [email or web address].    Your next appointment:   12 month(s)  Provider:   You may see Debbe Odea, MD or one of the following Advanced Practice Providers on your designated Care Team:   Nicolasa Ducking, NP Eula Listen, PA-C Cadence Fransico Michael, PA-C Charlsie Quest, NP   Signed, Debbe Odea, MD  09/28/2022 10:55 AM    [facility]

## 2022-09-28 NOTE — Patient Instructions (Signed)
Medication Instructions:   Your physician recommends that you continue on your current medications as directed. Please refer to the Current Medication list given to you today.  *If you need a refill on your cardiac medications before your next appointment, please call your pharmacy*   Lab Work:  None Ordered  If you have labs (blood work) drawn today and your tests are completely normal, you will receive your results only by: MyChart Message (if you have MyChart) OR A paper copy in the mail If you have any lab test that is abnormal or we need to change your treatment, we will call you to review the results.   Testing/Procedures:  None Ordered    Follow-Up: At [facility], you and your health needs are our priority.  As part of our continuing mission to provide you with exceptional heart care, we have created designated Provider Care Teams.  These Care Teams include your primary Cardiologist (physician) and Advanced Practice Providers (APPs -  Physician Assistants and Nurse Practitioners) who all work together to provide you with the care you need, when you need it.  We recommend signing up for the patient portal called "MyChart".  Sign up information is provided on this After Visit Summary.  MyChart is used to connect with patients for Virtual Visits (Telemedicine).  Patients are able to view lab/test results, encounter notes, upcoming appointments, etc.  Non-urgent messages can be sent to your provider as well.   To learn more about what you can do with MyChart, go to [email or web address].    Your next appointment:   12 month(s)  Provider:   You may see Debbe Odea, MD or one of the following Advanced Practice Providers on your designated Care Team:   Nicolasa Ducking, NP Eula Listen, PA-C Cadence Fransico Michael, PA-C Charlsie Quest, NP

## 2023-03-15 ENCOUNTER — Telehealth: Payer: Self-pay | Admitting: Cardiology

## 2023-03-15 NOTE — Telephone Encounter (Signed)
 Pt c/o BP issue: STAT if pt c/o blurred vision, one-sided weakness or slurred speech.  STAT if BP is GREATER than 180/120 TODAY.  STAT if BP is LESS than 90/60 and SYMPTOMATIC TODAY  1. What is your BP concern?  STAT BP reading today  2. Have you taken any BP medication today? Patient says she took Lisinopril and another medication she couldn't recall the name of today  3. What are your last 5 BP readings? 3/13: 238/125 3/12: 183/125  4. Are you having any other symptoms (ex. Dizziness, headache, blurred vision, passed out)?  No

## 2023-03-15 NOTE — Telephone Encounter (Signed)
 If BP is truly 238/125, I recommend that Ms. Aurther Loft go to the nearest ED for further evaluation and management.  I do not see any labs in our system since 2023.  Before starting spironolactone, it would be appropriate to have baseline BMP.  Yvonne Kendall, MD [facility]

## 2023-03-15 NOTE — Telephone Encounter (Signed)
 Pt called with guidance from Dr End.  Pt reports "I've been down that road before and I retook my pressure and it's 168/105" approx 2 pm.    Pt needs BMP prior to starting spironolactone, this RN offered to make appt and pt reports she'll call back with her schedule  [facility] message sent

## 2023-03-16 NOTE — Telephone Encounter (Signed)
 Called patient and left message for call back.

## 2023-03-19 ENCOUNTER — Emergency Department (HOSPITAL_COMMUNITY)

## 2023-03-19 ENCOUNTER — Other Ambulatory Visit: Payer: Self-pay

## 2023-03-19 ENCOUNTER — Emergency Department (HOSPITAL_COMMUNITY)
Admission: EM | Admit: 2023-03-19 | Discharge: 2023-03-19 | Disposition: A | Discharge status: Home / Self Care | Attending: Emergency Medicine | Admitting: Emergency Medicine

## 2023-03-19 ENCOUNTER — Encounter (HOSPITAL_COMMUNITY): Payer: Self-pay

## 2023-03-19 DIAGNOSIS — X58XXXA Exposure to other specified factors, initial encounter: Secondary | ICD-10-CM | POA: Insufficient documentation

## 2023-03-19 DIAGNOSIS — S43102A Unspecified dislocation of left acromioclavicular joint, initial encounter: Secondary | ICD-10-CM | POA: Insufficient documentation

## 2023-03-19 DIAGNOSIS — R55 Syncope and collapse: Secondary | ICD-10-CM | POA: Diagnosis not present

## 2023-03-19 DIAGNOSIS — S4992XA Unspecified injury of left shoulder and upper arm, initial encounter: Secondary | ICD-10-CM | POA: Diagnosis present

## 2023-03-19 LAB — URINALYSIS, ROUTINE W REFLEX MICROSCOPIC
Bacteria, UA: NONE SEEN
Bilirubin Urine: NEGATIVE
Glucose, UA: NEGATIVE mg/dL
Ketones, ur: NEGATIVE mg/dL
Leukocytes,Ua: NEGATIVE
Nitrite: NEGATIVE
Protein, ur: NEGATIVE mg/dL
Specific Gravity, Urine: 1.012 (ref 1.005–1.030)
pH: 5 (ref 5.0–8.0)

## 2023-03-19 LAB — CBC WITH DIFFERENTIAL/PLATELET
Abs Immature Granulocytes: 0.07 10*3/uL (ref 0.00–0.07)
Basophils Absolute: 0 10*3/uL (ref 0.0–0.1)
Basophils Relative: 0 %
Eosinophils Absolute: 0 10*3/uL (ref 0.0–0.5)
Eosinophils Relative: 0 %
HCT: 36.8 % (ref 36.0–46.0)
Hemoglobin: 11.4 g/dL — ABNORMAL LOW (ref 12.0–15.0)
Immature Granulocytes: 1 %
Lymphocytes Relative: 8 %
Lymphs Abs: 0.8 10*3/uL (ref 0.7–4.0)
MCH: 27.7 pg (ref 26.0–34.0)
MCHC: 31 g/dL (ref 30.0–36.0)
MCV: 89.3 fL (ref 80.0–100.0)
Monocytes Absolute: 0.4 10*3/uL (ref 0.1–1.0)
Monocytes Relative: 4 %
Neutro Abs: 8.1 10*3/uL — ABNORMAL HIGH (ref 1.7–7.7)
Neutrophils Relative %: 87 %
Platelets: 352 10*3/uL (ref 150–400)
RBC: 4.12 MIL/uL (ref 3.87–5.11)
RDW: 12.9 % (ref 11.5–15.5)
WBC: 9.3 10*3/uL (ref 4.0–10.5)
nRBC: 0 % (ref 0.0–0.2)

## 2023-03-19 LAB — COMPREHENSIVE METABOLIC PANEL
ALT: 13 U/L (ref 0–44)
AST: 19 U/L (ref 15–41)
Albumin: 4.1 g/dL (ref 3.5–5.0)
Alkaline Phosphatase: 77 U/L (ref 38–126)
Anion gap: 7 (ref 5–15)
BUN: 12 mg/dL (ref 6–20)
CO2: 27 mmol/L (ref 22–32)
Calcium: 9.2 mg/dL (ref 8.9–10.3)
Chloride: 107 mmol/L (ref 98–111)
Creatinine, Ser: 0.93 mg/dL (ref 0.44–1.00)
GFR, Estimated: >60 mL/min (ref >60)
Glucose, Bld: 122 mg/dL — ABNORMAL HIGH (ref 70–99)
Potassium: 3.2 mmol/L — ABNORMAL LOW (ref 3.5–5.1)
Sodium: 141 mmol/L (ref 135–145)
Total Bilirubin: 0.5 mg/dL (ref 0.0–1.2)
Total Protein: 7.5 g/dL (ref 6.5–8.1)

## 2023-03-19 LAB — HCG, SERUM, QUALITATIVE: Preg, Serum: NEGATIVE

## 2023-03-19 LAB — CBG MONITORING, ED: Glucose-Capillary: 83 mg/dL (ref 70–99)

## 2023-03-19 MED ORDER — FENTANYL CITRATE PF 50 MCG/ML IJ SOSY
50.0000 ug | PREFILLED_SYRINGE | Freq: Once | INTRAMUSCULAR | Status: DC
  Filled 2023-03-19: qty 1

## 2023-03-19 MED ORDER — ONDANSETRON HCL 4 MG/2ML IJ SOLN
4.0000 mg | Freq: Once | INTRAMUSCULAR | Status: DC
  Filled 2023-03-19: qty 2

## 2023-03-19 MED ORDER — SODIUM CHLORIDE 0.9 % IV BOLUS
1000.0000 mL | Freq: Once | INTRAVENOUS | Status: AC
  Administered 2023-03-19: 1000 mL via INTRAVENOUS

## 2023-03-19 MED ORDER — OXYCODONE HCL 5 MG PO TABS: 2.5000 mg | ORAL_TABLET | ORAL | 0 refills | Status: AC | PRN

## 2023-03-19 MED ORDER — OXYCODONE-ACETAMINOPHEN 5-325 MG PO TABS
2.0000 | ORAL_TABLET | Freq: Once | ORAL | Status: AC
  Administered 2023-03-19: 2 via ORAL
  Filled 2023-03-19: qty 2

## 2023-03-19 MED ORDER — POTASSIUM CHLORIDE CRYS ER 20 MEQ PO TBCR
40.0000 meq | EXTENDED_RELEASE_TABLET | Freq: Once | ORAL | Status: AC
  Administered 2023-03-19: 40 meq via ORAL
  Filled 2023-03-19: qty 2

## 2023-03-19 MED ORDER — HYDRALAZINE HCL 20 MG/ML IJ SOLN: 10.0000 mg | Freq: Once | INTRAMUSCULAR | Status: DC

## 2023-03-19 NOTE — ED Triage Notes (Signed)
 Pt BIB EMS from Home due to LOC and Fall resulting in a left shoulder injury. Pt c/o pain in left shoulder. Pt unaware if she hit her head, pt is not on blood thinners. Pt has Hx of syncope episodes and has been actively seeing specialist for this. Pt also has Hx of falls. Pt has no c/o head, neck, or back pain.  In Route [address] IV RAC 150 mcg Fentanyl 4 mg Zofran

## 2023-03-19 NOTE — ED Provider Notes (Signed)
 [place] [facility]MENT AT [facility] Provider Note   CSN: [id number] Arrival date & time: 03/19/23  1458     History  Chief Complaint  Patient presents with   Loss of Consciousness   Fall    Connie Whitney is a 43 y.o. female who presents with Syncope and left shoulder injury. Patient has a hx of recurrent Syncope. Patient was leaving the batrhroom today and had a syncopal event. She woke up on the floor and had pain in the L shoulder as well as numbness and tingling in her left hand. She has had multiple syncopal events in the past and has been worked up and multiple tertiary care centers and continues to seek answers for her recurrent syncope.  She denies chest pain.  She does not believe she hit her head.  She is not on any blood thinners.  She denies melena or hematochezia.   Loss of Consciousness Fall       Home Medications Prior to Admission medications   Medication Sig Start Date End Date Taking? Authorizing Provider  AMITIZA 24 MCG capsule Take 24 mcg by mouth 2 (two) times daily. 07/17/19   [provider]  AMITRIPTYLINE HCL PO Take 10 mg by mouth 1 day or 1 dose. Takes as needed    [provider]  cyclobenzaprine (FLEXERIL) 10 MG tablet SMARTSIG:1 Tablet(s) By Mouth Every 12 Hours 05/26/19   [provider]  gabapentin (NEURONTIN) 300 MG capsule Take 3 capsules (900 mg total) by mouth at bedtime. 07/30/18 02/11/28  Delano Metz, MD  LINZESS 290 MCG CAPS capsule Take 290 mcg by mouth daily. 03/03/20   [provider]  lisinopril (ZESTRIL) 20 MG tablet Take 1 tablet (20 mg total) by mouth 2 (two) times daily. 09/28/22   Debbe Odea, MD  MOUNJARO 5 MG/0.5ML Pen Inject 5 mg into the skin once a week. 09/06/22   [provider]  Multiple Vitamin (MULTIVITAMIN) capsule  08/17/16   [provider]  oxyCODONE-acetaminophen (PERCOCET) 7.5-325 MG tablet Take 1 tablet by mouth every 4 (four) hours as  needed for severe pain.    [provider]  phentermine 37.5 MG capsule Take 37.5 mg by mouth daily. 02/10/20   [provider]  spironolactone (ALDACTONE) 25 MG tablet Take 1 tablet (25 mg total) by mouth at bedtime. Please call [phone] to schedule an appointment for further refills. Thank you. 06/01/22   Debbe Odea, MD      Allergies    Milk (cow), Milk-related compounds, Pravastatin, and Venlafaxine    Review of Systems   Review of Systems  Cardiovascular:  Positive for syncope.    Physical Exam Updated Vital Signs BP 127/88 (BP Location: Right Arm)   Pulse 68   Temp (!) 97.5 F (36.4 C) (Oral)   Resp 16   Ht 5\' 10"  (1.778 m)   Wt 80.7 kg   SpO2 96%   BMI 25.54 kg/m  Physical Exam Vitals and nursing note reviewed.  Constitutional:      General: She is not in acute distress.    Appearance: She is well-developed. She is not diaphoretic.  HENT:     Head: Normocephalic and atraumatic.     Right Ear: External ear normal.     Left Ear: External ear normal.     Nose: Nose normal.     Mouth/Throat:     Mouth: Mucous membranes are moist.  Eyes:     General: No scleral icterus.  Conjunctiva/sclera: Conjunctivae normal.  Cardiovascular:     Rate and Rhythm: Normal rate and regular rhythm.     Heart sounds: Normal heart sounds. No murmur heard.    No friction rub. No gallop.  Pulmonary:     Effort: Pulmonary effort is normal. No respiratory distress.     Breath sounds: Normal breath sounds.  Abdominal:     General: Bowel sounds are normal. There is no distension.     Palpations: Abdomen is soft. There is no mass.     Tenderness: There is no abdominal tenderness. There is no guarding.  Musculoskeletal:     Cervical back: Normal range of motion.     Comments: Left shoulder deformity  Skin:    General: Skin is warm and dry.  Neurological:     Mental Status: She is alert and oriented to person, place, and time.     Comments: Speech is clear  and goal oriented, follows commands Major Cranial nerves without deficit, no facial droop Normal strength in upper and lower extremities bilaterally including dorsiflexion and plantar flexion, strong and equal grip strength Sensation normal to light and sharp touch Moves extremities without ataxia, coordination intact Normal finger to nose and rapid alternating movements  no pronator drift    Psychiatric:        Behavior: Behavior normal.     ED Results / Procedures / Treatments   Labs (all labs ordered are listed, but only abnormal results are displayed) Labs Reviewed - No data to display  EKG None  Radiology No results found.  Procedures Procedures    Medications Ordered in ED Medications - No data to display  ED Course/ Medical Decision Making/ A&P                                 Medical Decision Making Amount and/or Complexity of Data Reviewed Labs: ordered. Radiology: ordered.  Risk Prescription drug management.   Patient here with recurrent syncope. The differential for syncope is extensive and includes, but is not limited to: arrythmia (Vtach, SVT, SSS, sinus arrest, AV block, bradycardia) aortic stenosis, AMI, HOCM, PE, atrial myxoma, pulmonary hypertension, orthostatic hypotension, (hypovolemia, drug effect, GB syndrome, micturition, cough, swall) carotid sinus sensitivity, Seizure, TIA/CVA, hypoglycemia,  Vertigo.   I reviewed patient's labs.  Urine negative for infection, CBG shows glucose of 83.  CBC with mild anemia of insignificant value.  Potassium 3.2.  EKG shows sinus rhythm at a rate of 80 without significant abnormality.    I visualized and interpreted a left shoulder x-ray which shows a grade 5 AC joint separation.  I discussed the case with Dr. Eulah Pont who recommends sling immobilizer and close clinic follow-up.  PDMP reviewed during this encounter.   Patient appears appropriate for discharge.  Discharged with oxycodone.  Patient's  long standing history of recurrent syncope may be worked up in the outpatient setting.  She does not appear to have any acute cause this evening.  Discussed outpatient follow-up and return precautions.  Appropriate for discharge at this time.        Final Clinical Impression(s) / ED Diagnoses Final diagnoses:  None    Rx / DC Orders ED Discharge Orders     None         Arthor Captain, PA-C 03/19/23 2123    Rolan Bucco, MD 03/19/23 2226

## 2023-03-19 NOTE — ED Notes (Signed)
 Patient d/c with home care instructions with family at bedside. Patient provided with wheelchair and wheeled to her ride out front of main lobby.

## 2023-03-19 NOTE — Discharge Instructions (Addendum)
 You have a type v (5) A/c joint separation. This may require a surgery however you will need to follow-up with Dr. Eulah Pont.  Please call first thing tomorrow to set up an appointment for Wednesday or Friday.  I have also ordered pain medications for you to take at home.  You may apply ice to the affected area.  Keep it immobilized except for when you are bathing. Contact a health care provider if: Pain medicine is not relieving your pain. Your pain and stiffness are not improving after 2 weeks. You are not able to do your physical therapy exercises because of pain or stiffness. Get help right away if: Your arm on the injured side feels cold or numb. Your skin or fingers on the arm on the injured side turn blue or gray.

## 2023-03-19 NOTE — Progress Notes (Signed)
 Orthopedic Tech Progress Note Patient Details:  Connie Whitney 01/29/1980 [id number]  Patient ID: [name], female   DOB: [birth year], 43 y.o.   MRN: [id number] Shoulder sling applied by day shift RN at 1720. Darleen Crocker 03/19/2023, 7:53 PM

## 2023-03-21 ENCOUNTER — Telehealth: Payer: Self-pay | Admitting: Cardiology

## 2023-03-21 NOTE — Telephone Encounter (Signed)
 Pt c/o Syncope: STAT if syncope occurred within 24 hours and pt complains of lightheadedness  Did you pass out today? No  When is the last time you passed out? Monday  Has this occurred multiple times? It's happen a few times over the years   Did you have any symptoms prior to passing out? lightheadedness  5. Did you fall? If so, are you on a blood thinner? Yes, and now her shoulder is injured. Pt is not on a blood thinner.

## 2023-03-21 NOTE — Telephone Encounter (Signed)
 Spoke with pt, she was coming out of the bathroom, got lightheaded, and then woke up on the floor. She reports the lightheadedness usually is her warning and she will usually get to the wall and then slides down the wall but she did not make it this time. She does not really know how she felt when she woke up because her shoulder was hurting so bad. She reports by the time EMS arrived her blood pressure was 105/60. She is currently at the doctor for her shoulder and would like a call back later. Aware will forward this information to dr [email or web address] for his recommendations.

## 2023-03-22 NOTE — Telephone Encounter (Signed)
 Attempted to contact pt. Unable to leave message as mailbox is full  Debbe Odea, MD  [facility] Triage7 minutes ago (5:07 PM)    Unsure etiology for syncope with BP being normal.  Patient can schedule follow-up appointment with myself or APP for additional recs.

## 2023-03-23 NOTE — Telephone Encounter (Signed)
 Called patient, she states that she did fall- it messed up her arm area and she will be having surgery on Wednesday- she will call us after the surgery to get scheduled.

## 2023-03-23 NOTE — Telephone Encounter (Signed)
 Called patient, see previous message- recommendations for appointment.  Patient advised she was having surgery and wanted to have this completed first and would call after the surgery for an appointment.

## 2023-03-27 ENCOUNTER — Ambulatory Visit (HOSPITAL_BASED_OUTPATIENT_CLINIC_OR_DEPARTMENT_OTHER): Admission: RE | Admit: 2023-03-27 | Source: Home / Self Care | Admitting: Orthopedic Surgery

## 2023-03-27 ENCOUNTER — Encounter (HOSPITAL_BASED_OUTPATIENT_CLINIC_OR_DEPARTMENT_OTHER): Admission: RE | Payer: Self-pay | Source: Home / Self Care

## 2023-03-27 SURGERY — REPAIR, ACROMIOCLAVICULAR JOINT
Anesthesia: General | Site: Shoulder | Laterality: Left

## 2023-04-09 ENCOUNTER — Other Ambulatory Visit: Payer: Self-pay

## 2023-04-09 ENCOUNTER — Encounter: Payer: Self-pay | Admitting: Emergency Medicine

## 2023-04-09 ENCOUNTER — Ambulatory Visit
Admission: EM | Admit: 2023-04-09 | Discharge: 2023-04-09 | Disposition: A | Discharge status: Home / Self Care | Attending: Emergency Medicine | Admitting: Emergency Medicine

## 2023-04-09 DIAGNOSIS — M25561 Pain in right knee: Secondary | ICD-10-CM
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
## 2023-04-09 NOTE — Discharge Instructions (Signed)
 Your evaluated for your knee pain, there is tenderness and swelling present, on x-ray imaging unable to visualize tendons or ligaments therefore has been deferred at this time  You have been given a hinged knee brace which does not limit some mobility, please be mindful of this and do not wear 24 hours, you do need to go periods of time without brace on   May use ice or heat over the affected area 10 to 15-minute intervals  You may elevate on pillows whenever sitting or lying  Please schedule follow-up appointment with your primary doctor if your symptoms continue to persist
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
## 2023-04-19 ENCOUNTER — Ambulatory Visit: Attending: Cardiology | Admitting: Cardiology

## 2023-04-19 ENCOUNTER — Encounter: Payer: Self-pay | Admitting: Cardiology

## 2023-04-19 VITALS — BP 160/112 | HR 90 | Ht 70.0 in | Wt 183.1 lb

## 2023-04-19 DIAGNOSIS — R55 Syncope and collapse: Secondary | ICD-10-CM | POA: Diagnosis not present

## 2023-04-19 DIAGNOSIS — I10 Essential (primary) hypertension: Secondary | ICD-10-CM

## 2023-04-19 MED ORDER — SPIRONOLACTONE 25 MG PO TABS: 25.0000 mg | ORAL_TABLET | Freq: Every day | ORAL | 3 refills | Status: DC

## 2023-04-19 NOTE — Patient Instructions (Signed)
   Testing/Procedures:  Your physician has requested that you have a carotid duplex. This test is an ultrasound of the carotid arteries in your neck. It looks at blood flow through these arteries that supply the brain with blood. Allow one hour for this exam. There are no restrictions or special instructions. NEED ASAP  Your physician has requested that you have a upper extremity arterial duplex. This test is an ultrasound of the arteries in the arms. It looks at arterial blood flow in the arms. Allow one hour for Upper Arterial scans. There are no restrictions or special instructions.  Please note: We ask at that you not bring children with you during ultrasound (echo/ vascular) testing. Due to room size and safety concerns, children are not allowed in the ultrasound rooms during exams. Our front office staff cannot provide observation of children in our lobby area while testing is being conducted. An adult accompanying a patient to their appointment will only be allowed in the ultrasound room at the discretion of the ultrasound technician under special circumstances. We apologize for any inconvenience. NEED ASAP  Follow-Up: At [facility], you and your health needs are our priority.  As part of our continuing mission to provide you with exceptional heart care, our providers are all part of one team.  This team includes your primary Cardiologist (physician) and Advanced Practice Providers or APPs (Physician Assistants and Nurse Practitioners) who all work together to provide you with the care you need, when you need it.  Your next appointment:   6 week(s)  Provider:   Constancia Delton, MD

## 2023-04-19 NOTE — Progress Notes (Signed)
 Cardiology Office Note:    Date:  04/19/2023   ID:  Connie Whitney, DOB [birth year], MRN [id number]  PCP:  Center, [facility]  Cardiologist:  Constancia Delton, MD  Electrophysiologist:  None   Referring MD: Center, [facility]*   Chief Complaint  Patient presents with   [facility] Follow up     Recurrent spells of syncope.     History of Present Illness:    Connie Whitney is a 43 y.o. female with a hx of fibromyalgia, hypertension, obesity s/p gastric bypass, arthritis, joint hypermobility, who presents due to recurrent syncope.  Patient has a history of hypotension leading to falls.  Blood pressure medications were adjusted, seem to be doing well for several months.  Syncopal episodes usually have prodromal symptoms of flushed feeling, dizziness prior to patient falling.  Blood pressures are usually low.  Last episode of syncope was last month while patient was at home.  She fell dislocating her left shoulder requiring surgery.  She was still there is a flap in one of the arteries in the neck/left upper extremity area.  Currently in some discomfort which might be causing her elevated BPs.   Prior notes Echo 10/2018 EF 60 to 65% BiDil was stopped after patient states her blood pressure bottomed out after taking BiDil.  Systolic blood pressure typically in the 90s and slowly go up throughout the day.  She states being on HCTZ before but that was also stopped likely due to low potassium levels.  Hydralazine was previously stopped due to blood pressures becoming very low. She also has a history of hypermobile joints with lumbar vertebrae displacement.  Has been seeing pain management for the past 5 years.  She is concerned she may have Marfan's.   Work-up for secondary causes including renal artery ultrasound, renin aldosterone studies, metanephrine studies, CT abdomen to evaluate adrenal mass have all been unremarkable.  Past Medical History:  Diagnosis Date   Anemia     Arthritis    Atypical chest pain 03/12/2019   Fibromyalgia    Hypertension    Lupus    Migraines    Neuropathy    Obesity (BMI 30.0-34.9) 03/12/2019   Palpitations 03/12/2019   Vitamin D deficiency     Past Surgical History:  Procedure Laterality Date   BARIATRIC SURGERY     CESAREAN SECTION     DILATION AND CURETTAGE, DIAGNOSTIC / THERAPEUTIC     ESOPHAGOGASTRODUODENOSCOPY (EGD) WITH PROPOFOL N/A 08/02/2016   Procedure: ESOPHAGOGASTRODUODENOSCOPY (EGD) WITH PROPOFOL;  Surgeon: Cassie Click, MD;  Location: [facility];  Service: Endoscopy;  Laterality: N/A;   HYSTEROSCOPY WITH NOVASURE N/A 08/21/2014   Procedure: HYSTEROSCOPY WITH NOVASURE;  Surgeon: Raynell Caller, MD;  Location: [facility] ORS;  Service: Gynecology;  Laterality: N/A;   TUBAL LIGATION      Current Medications: Current Meds  Medication Sig   AMITIZA 24 MCG capsule Take 24 mcg by mouth 2 (two) times daily.   AMITRIPTYLINE HCL PO Take 10 mg by mouth 1 day or 1 dose. Takes as needed   cyclobenzaprine (FLEXERIL) 10 MG tablet SMARTSIG:1 Tablet(s) By Mouth Every 12 Hours   gabapentin (NEURONTIN) 300 MG capsule Take 3 capsules (900 mg total) by mouth at bedtime.   LINZESS 290 MCG CAPS capsule Take 290 mcg by mouth daily.   lisinopril (ZESTRIL) 20 MG tablet Take 1 tablet (20 mg total) by mouth 2 (two) times daily.   MOUNJARO 5 MG/0.5ML Pen Inject 5 mg into the skin once a week.  Multiple Vitamin (MULTIVITAMIN) capsule    oxyCODONE (ROXICODONE) 5 MG immediate release tablet Take 0.5-1 tablets (2.5-5 mg total) by mouth every 4 (four) hours as needed for severe pain (pain score 7-10).   phentermine 37.5 MG capsule Take 37.5 mg by mouth daily.     Allergies:   Milk (cow), Pravastatin, Venlafaxine, and Milk-related compounds   Social History   Socioeconomic History   Marital status: Single    Spouse name: Not on file   Number of children: Not on file   Years of education: Not on file   Highest education level: Not  on file  Occupational History   Not on file  Tobacco Use   Smoking status: Never   Smokeless tobacco: Never  Vaping Use   Vaping status: Never Used  Substance and Sexual Activity   Alcohol use: No   Drug use: No   Sexual activity: Never  Other Topics Concern   Not on file  Social History Narrative   Not on file   Social Drivers of Health   Financial Resource Strain: Not on file  Food Insecurity: No Food Insecurity (02/27/2019)   Hunger Vital Sign    Worried About Running Out of Food in the Last Year: Never true    Ran Out of Food in the Last Year: Never true  Transportation Needs: No Transportation Needs (02/27/2019)   PRAPARE - Administrator, Civil Service (Medical): No    Lack of Transportation (Non-Medical): No  Physical Activity: Not on file  Stress: Not on file  Social Connections: Unknown (05/06/2021)   Received from [facility]   Social Network    Social Network: Not on file     Family History: The patient's family history includes Heart failure in her paternal aunt; Heart murmur in her maternal grandfather and mother; Hyperlipidemia in her father; Hypertension in her father and mother; Stroke in her maternal grandfather.  ROS:   Please see the history of present illness.     All other systems reviewed and are negative.  EKGs/Labs/Other Studies Reviewed:    The following studies were reviewed today:   EKG Interpretation Date/Time:  Thursday April 19 2023 10:00:25 EDT Ventricular Rate:  90 PR Interval:  130 QRS Duration:  86 QT Interval:  382 QTC Calculation: 467 R Axis:   -14  Text Interpretation: Normal sinus rhythm Normal ECG Confirmed by Constancia Delton ([id number]) on 04/19/2023 10:38:42 AM    Recent Labs: 03/19/2023: ALT 13; BUN 12; Creatinine, Ser 0.93; Hemoglobin 11.4; Platelets 352; Potassium 3.2; Sodium 141  Recent Lipid Panel No results found for: "CHOL", "TRIG", "HDL", "CHOLHDL", "VLDL", "LDLCALC",  "LDLDIRECT"  Physical Exam:    VS:  BP (!) 160/112 (BP Location: Right Arm, Patient Position: Sitting, Cuff Size: Normal)   Pulse 90   Ht 5\' 10"  (1.778 m)   Wt 183 lb 2 oz (83.1 kg)   SpO2 90%   BMI 26.28 kg/m     Wt Readings from Last 3 Encounters:  04/19/23 183 lb 2 oz (83.1 kg)  03/19/23 178 lb (80.7 kg)  09/28/22 180 lb 9.6 oz (81.9 kg)     GEN:  Well nourished, well developed in no acute distress HEENT: Normal NECK: No JVD; No carotid bruits CARDIAC: RRR RESPIRATORY:  Clear to auscultation without rales, wheezing or rhonchi  ABDOMEN: Soft, non-tender, non-distended MUSCULOSKELETAL: Patient noted to have hypermobile joints. SKIN: Warm and dry NEUROLOGIC:  Alert and oriented x 3 PSYCHIATRIC:  Normal affect  ASSESSMENT:    1. Syncope and collapse   2. Primary hypertension     PLAN:    Syncope, prodromal symptoms.  Patient with wide fluctuations in BP, and hypotension leading to falls/syncope.  Systolic usually in the 70s after syncopal event.  Apparently anesthesia mention an abnormality in the neck artery area.  Will obtain ultrasound of the neck to evaluate carotid and vertebral, also evaluate upper extremity arteries.  Let BP run a little high.  Previous workup with echo and cardiac monitor have been unrevealing. Hypertension, blood pressure elevated today, currently with left shoulder pain which might be contributing to elevated BP. Continue lisinopril 20 mg twice daily, spironolactone 25 mg nightly.  Will avoid increasing any medications at this point.  Allow permissive hypertension due to history of significant hypotension leading to falls.  Follow-up in 6 to 8 weeks  Medication Adjustments/Labs and Tests Ordered: Current medicines are reviewed at length with the patient today.  Concerns regarding medicines are outlined above.  Orders Placed This Encounter  Procedures   EKG 12-Lead   VAS US  CAROTID   VAS US  UPPER EXTREMITY ARTERIAL DUPLEX    Meds ordered  this encounter  Medications   spironolactone (ALDACTONE) 25 MG tablet    Sig: Take 1 tablet (25 mg total) by mouth at bedtime.    Dispense:  90 tablet    Refill:  3     Patient Instructions    Testing/Procedures:  Your physician has requested that you have a carotid duplex. This test is an ultrasound of the carotid arteries in your neck. It looks at blood flow through these arteries that supply the brain with blood. Allow one hour for this exam. There are no restrictions or special instructions. NEED ASAP  Your physician has requested that you have a upper extremity arterial duplex. This test is an ultrasound of the arteries in the arms. It looks at arterial blood flow in the arms. Allow one hour for Upper Arterial scans. There are no restrictions or special instructions.  Please note: We ask at that you not bring children with you during ultrasound (echo/ vascular) testing. Due to room size and safety concerns, children are not allowed in the ultrasound rooms during exams. Our front office staff cannot provide observation of children in our lobby area while testing is being conducted. An adult accompanying a patient to their appointment will only be allowed in the ultrasound room at the discretion of the ultrasound technician under special circumstances. We apologize for any inconvenience. NEED ASAP  Follow-Up: At [facility], you and your health needs are our priority.  As part of our continuing mission to provide you with exceptional heart care, our providers are all part of one team.  This team includes your primary Cardiologist (physician) and Advanced Practice Providers or APPs (Physician Assistants and Nurse Practitioners) who all work together to provide you with the care you need, when you need it.  Your next appointment:   6 week(s)  Provider:   Constancia Delton, MD              Signed, Constancia Delton, MD  04/19/2023 11:19 AM    [facility]

## 2023-05-17 ENCOUNTER — Ambulatory Visit: Payer: Self-pay | Admitting: Cardiology

## 2023-05-17 ENCOUNTER — Ambulatory Visit (INDEPENDENT_AMBULATORY_CARE_PROVIDER_SITE_OTHER)

## 2023-05-17 ENCOUNTER — Ambulatory Visit: Attending: Cardiology

## 2023-05-17 DIAGNOSIS — R55 Syncope and collapse: Secondary | ICD-10-CM

## 2023-05-30 ENCOUNTER — Ambulatory Visit: Attending: Cardiology | Admitting: Cardiology

## 2023-05-30 ENCOUNTER — Encounter: Payer: Self-pay | Admitting: Cardiology

## 2023-05-30 VITALS — BP 150/102 | HR 82 | Ht 70.0 in | Wt 192.4 lb

## 2023-05-30 DIAGNOSIS — R55 Syncope and collapse: Secondary | ICD-10-CM

## 2023-05-30 MED ORDER — SPIRONOLACTONE 25 MG PO TABS: 12.5000 mg | ORAL_TABLET | Freq: Every day | ORAL | 3 refills | Status: DC

## 2023-05-30 NOTE — Progress Notes (Signed)
 Cardiology Office Note:    Date:  05/30/2023   ID:  Connie Whitney, DOB 1980-09-15, MRN [id number]  PCP:  [facility]  Cardiologist:  Constancia Delton, MD  Electrophysiologist:  None   Referring MD: Center, [facility]*   Chief Complaint  Patient presents with   Follow-up    6 week follow up visit. Patient is doing well on today. Meds reviewed.     History of Present Illness:    Connie Whitney is a 43 y.o. female with a hx of fibromyalgia, hypertension, obesity s/p gastric bypass, arthritis, joint hypermobility, who presents for follow-up.  She has a history of labile blood pressures leading to hypotension, syncope.  Workup so far has been unrevealing.  Currently takes lisinopril  20 mg twice daily.  Aldactone  was held to prevent hypotension.  Had an episode of syncope while off spironolactone .  Carotid ultrasound and left upper extremity ultrasound were obtained, no significant stenosis noted.  Has not had any episode of syncope since last visit.  Prior notes Echo 10/2018 EF 60 to 65% BiDil was stopped after patient states her blood pressure bottomed out after taking BiDil.  Systolic blood pressure typically in the 90s and slowly go up throughout the day.  She states being on HCTZ before but that was also stopped likely due to low potassium levels.  Hydralazine  was previously stopped due to blood pressures becoming very low. She also has a history of hypermobile joints with lumbar vertebrae displacement.  Has been seeing pain management for the past 5 years.  She is concerned she may have Marfan's.   Work-up for secondary causes including renal artery ultrasound, renin aldosterone studies, metanephrine studies, CT abdomen to evaluate adrenal mass have all been unremarkable.  Past Medical History:  Diagnosis Date   Anemia    Arthritis    Atypical chest pain 03/12/2019   Fibromyalgia    Hypertension    Lupus    Migraines    Neuropathy    Obesity (BMI  30.0-34.9) 03/12/2019   Palpitations 03/12/2019   Vitamin D  deficiency     Past Surgical History:  Procedure Laterality Date   BARIATRIC SURGERY     CESAREAN SECTION     DILATION AND CURETTAGE, DIAGNOSTIC / THERAPEUTIC     ESOPHAGOGASTRODUODENOSCOPY (EGD) WITH PROPOFOL  N/A 08/02/2016   Procedure: ESOPHAGOGASTRODUODENOSCOPY (EGD) WITH PROPOFOL ;  Surgeon: Cassie Click, MD;  Location: [facility];  Service: Endoscopy;  Laterality: N/A;   HYSTEROSCOPY WITH NOVASURE N/A 08/21/2014   Procedure: HYSTEROSCOPY WITH NOVASURE;  Surgeon: Raynell Caller, MD;  Location: [facility] ORS;  Service: Gynecology;  Laterality: N/A;   TUBAL LIGATION      Current Medications: Current Meds  Medication Sig   AMITIZA 24 MCG capsule Take 24 mcg by mouth 2 (two) times daily.   AMITRIPTYLINE HCL PO Take 10 mg by mouth 1 day or 1 dose. Takes as needed   cyclobenzaprine  (FLEXERIL ) 10 MG tablet SMARTSIG:1 Tablet(s) By Mouth Every 12 Hours   gabapentin  (NEURONTIN ) 300 MG capsule Take 3 capsules (900 mg total) by mouth at bedtime.   LINZESS 290 MCG CAPS capsule Take 290 mcg by mouth daily.   lisinopril  (ZESTRIL ) 20 MG tablet Take 1 tablet (20 mg total) by mouth 2 (two) times daily.   Multiple Vitamin (MULTIVITAMIN) capsule    oxyCODONE  (ROXICODONE ) 5 MG immediate release tablet Take 0.5-1 tablets (2.5-5 mg total) by mouth every 4 (four) hours as needed for severe pain (pain score 7-10).   phentermine 37.5 MG  capsule Take 37.5 mg by mouth daily.   [DISCONTINUED] spironolactone  (ALDACTONE ) 25 MG tablet Take 1 tablet (25 mg total) by mouth at bedtime.     Allergies:   Milk (cow), Pravastatin, Venlafaxine, and Milk-related compounds   Social History   Socioeconomic History   Marital status: Single    Spouse name: Not on file   Number of children: Not on file   Years of education: Not on file   Highest education level: Not on file  Occupational History   Not on file  Tobacco Use   Smoking status: Never    Smokeless tobacco: Never  Vaping Use   Vaping status: Never Used  Substance and Sexual Activity   Alcohol use: No   Drug use: No   Sexual activity: Never  Other Topics Concern   Not on file  Social History Narrative   Not on file   Social Drivers of Health   Financial Resource Strain: Not on file  Food Insecurity: No Food Insecurity (02/27/2019)   Hunger Vital Sign    Worried About Running Out of Food in the Last Year: Never true    Ran Out of Food in the Last Year: Never true  Transportation Needs: No Transportation Needs (02/27/2019)   PRAPARE - Administrator, Civil Service (Medical): No    Lack of Transportation (Non-Medical): No  Physical Activity: Not on file  Stress: Not on file  Social Connections: Unknown (05/06/2021)   Received from [facility]   Social Network    Social Network: Not on file     Family History: The patient's family history includes Heart failure in her paternal aunt; Heart murmur in her maternal grandfather and mother; Hyperlipidemia in her father; Hypertension in her father and mother; Stroke in her maternal grandfather.  ROS:   Please see the history of present illness.     All other systems reviewed and are negative.  EKGs/Labs/Other Studies Reviewed:    The following studies were reviewed today:        Recent Labs: 03/19/2023: ALT 13; BUN 12; Creatinine, Ser 0.93; Hemoglobin 11.4; Platelets 352; Potassium 3.2; Sodium 141  Recent Lipid Panel No results found for: "CHOL", "TRIG", "HDL", "CHOLHDL", "VLDL", "LDLCALC", "LDLDIRECT"  Physical Exam:    VS:  BP (!) 150/102   Pulse 82   Ht 5\' 10"  (1.778 m)   Wt 192 lb 6.4 oz (87.3 kg)   SpO2 98%   BMI 27.61 kg/m     Wt Readings from Last 3 Encounters:  05/30/23 192 lb 6.4 oz (87.3 kg)  04/19/23 183 lb 2 oz (83.1 kg)  03/19/23 178 lb (80.7 kg)     GEN:  Well nourished, well developed in no acute distress HEENT: Normal NECK: No JVD; No carotid  bruits CARDIAC: RRR RESPIRATORY:  Clear to auscultation without rales, wheezing or rhonchi  ABDOMEN: Soft, non-tender, non-distended MUSCULOSKELETAL: Patient noted to have hypermobile joints. SKIN: Warm and dry NEUROLOGIC:  Alert and oriented x 3 PSYCHIATRIC:  Normal affect    ASSESSMENT:    1. Syncope and collapse      PLAN:    Syncope, attributed to low BP, typically with prodromal symptoms.  Carotid ultrasound unrevealing.  Left upper extremity no stenosis.  EF normal. Hypertension, blood pressure elevated today.  Start Aldactone  12.5 mg daily, continue lisinopril  20 mg twice daily, spironolactone  25 mg nightly.  Allow permissive hypertension.  Refer to hypertension clinic for second opinion  Follow-up in 5 months.  Medication Adjustments/Labs and Tests Ordered: Current medicines are reviewed at length with the patient today.  Concerns regarding medicines are outlined above.  Orders Placed This Encounter  Procedures   Ambulatory referral to Nephrology    Meds ordered this encounter  Medications   spironolactone  (ALDACTONE ) 25 MG tablet    Sig: Take 0.5 tablets (12.5 mg total) by mouth at bedtime.    Dispense:  30 tablet    Refill:  3     Patient Instructions  Medication Instructions:  Your physician recommends the following medication changes.  DECREASE: Spironolactone  to 12.5 mg daily  *If you need a refill on your cardiac medications before your next appointment, please call your pharmacy*  Lab Work: No labs ordered today  If you have labs (blood work) drawn today and your tests are completely normal, you will receive your results only by: MyChart Message (if you have MyChart) OR A paper copy in the mail If you have any lab test that is abnormal or we need to change your treatment, we will call you to review the results.  Testing/Procedures: No test ordered today   Follow-Up: At [facility], you and your health needs are our priority.  As part  of our continuing mission to provide you with exceptional heart care, our providers are all part of one team.  This team includes your primary Cardiologist (physician) and Advanced Practice Providers or APPs (Physician Assistants and Nurse Practitioners) who all work together to provide you with the care you need, when you need it.  Your next appointment:   4 month(s)  Provider:   You may see Constancia Delton, MD  We recommend signing up for the patient portal called "MyChart".  Sign up information is provided on this After Visit Summary.  MyChart is used to connect with patients for Virtual Visits (Telemedicine).  Patients are able to view lab/test results, encounter notes, upcoming appointments, etc.  Non-urgent messages can be sent to your provider as well.   To learn more about what you can do with MyChart, go to [email or web address].     Referral to [facility] Nephrology and Hypertension Address: [address], [place] [address] Phone: [phone]       Signed, Constancia Delton, MD  05/30/2023 1:11 PM    [facility]

## 2023-05-30 NOTE — Patient Instructions (Addendum)
 Medication Instructions:  Your physician recommends the following medication changes.  DECREASE: Spironolactone  to 12.5 mg daily  *If you need a refill on your cardiac medications before your next appointment, please call your pharmacy*  Lab Work: No labs ordered today  If you have labs (blood work) drawn today and your tests are completely normal, you will receive your results only by: MyChart Message (if you have MyChart) OR A paper copy in the mail If you have any lab test that is abnormal or we need to change your treatment, we will call you to review the results.  Testing/Procedures: No test ordered today   Follow-Up: At [facility], you and your health needs are our priority.  As part of our continuing mission to provide you with exceptional heart care, our providers are all part of one team.  This team includes your primary Cardiologist (physician) and Advanced Practice Providers or APPs (Physician Assistants and Nurse Practitioners) who all work together to provide you with the care you need, when you need it.  Your next appointment:   4 month(s)  Provider:   You may see Constancia Delton, MD  We recommend signing up for the patient portal called "MyChart".  Sign up information is provided on this After Visit Summary.  MyChart is used to connect with patients for Virtual Visits (Telemedicine).  Patients are able to view lab/test results, encounter notes, upcoming appointments, etc.  Non-urgent messages can be sent to your provider as well.   To learn more about what you can do with MyChart, go to [email or web address].     Referral to [facility] Nephrology and Hypertension Address: [address], [place] [address] Phone: [phone]

## 2023-08-12 ENCOUNTER — Other Ambulatory Visit: Payer: Self-pay

## 2023-08-12 ENCOUNTER — Emergency Department

## 2023-08-12 ENCOUNTER — Encounter: Payer: Self-pay | Admitting: Emergency Medicine

## 2023-08-12 ENCOUNTER — Emergency Department
Admission: EM | Admit: 2023-08-12 | Discharge: 2023-08-12 | Disposition: A | Discharge status: Home / Self Care | Attending: Emergency Medicine | Admitting: Emergency Medicine

## 2023-08-12 DIAGNOSIS — I10 Essential (primary) hypertension: Secondary | ICD-10-CM | POA: Diagnosis not present

## 2023-08-12 DIAGNOSIS — Y92410 Unspecified street and highway as the place of occurrence of the external cause: Secondary | ICD-10-CM | POA: Diagnosis not present

## 2023-08-12 DIAGNOSIS — M545 Low back pain, unspecified: Secondary | ICD-10-CM | POA: Diagnosis not present

## 2023-08-12 DIAGNOSIS — M25561 Pain in right knee: Secondary | ICD-10-CM | POA: Insufficient documentation

## 2023-08-12 DIAGNOSIS — M25512 Pain in left shoulder: Secondary | ICD-10-CM | POA: Insufficient documentation

## 2023-08-12 DIAGNOSIS — M549 Dorsalgia, unspecified: Secondary | ICD-10-CM
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
## 2023-08-12 NOTE — Discharge Instructions (Signed)
 The x-rays of your right knee, left shoulder and back did not show any fractures or acute abnormalities today.  You can take 650 mg of Tylenol  and 600 mg of ibuprofen  every 6 hours as needed for pain. You can use ice, heat, muscle creams and other topical pain relievers as well.  You will likely be more sore tomorrow than you are today, this is normal. After this your pain should slowly be improving. If not, you need to be seen by another health care provider. This could be your PCP, urgent care or in the emergency department. Return to the emergency department specifically, if you have development of chest pain, shortness of breath, abdominal pain, new abdominal bruising or any other symptom personally concerning to you.

## 2023-09-28 ENCOUNTER — Encounter: Payer: Self-pay | Admitting: Cardiology

## 2023-09-28 ENCOUNTER — Other Ambulatory Visit: Payer: Self-pay | Admitting: Family Medicine

## 2023-09-28 ENCOUNTER — Ambulatory Visit: Attending: Cardiology | Admitting: Cardiology

## 2023-09-28 VITALS — BP 160/108 | HR 84 | Ht 70.0 in | Wt 206.0 lb

## 2023-09-28 DIAGNOSIS — R55 Syncope and collapse: Secondary | ICD-10-CM

## 2023-09-28 DIAGNOSIS — I10 Essential (primary) hypertension: Secondary | ICD-10-CM | POA: Diagnosis not present

## 2023-09-28 DIAGNOSIS — Z1231 Encounter for screening mammogram for malignant neoplasm of breast: Secondary | ICD-10-CM

## 2023-09-28 MED ORDER — SPIRONOLACTONE 25 MG PO TABS: 25.0000 mg | ORAL_TABLET | Freq: Every day | ORAL | 3 refills | Status: AC
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
## 2023-09-28 NOTE — Patient Instructions (Signed)
 Medication Instructions:  - INCREASE spironolactone  to 25 mg daily   *If you need a refill on your cardiac medications before your next appointment, please call your pharmacy*  Lab Work: No labs ordered today  If you have labs (blood work) drawn today and your tests are completely normal, you will receive your results only by: MyChart Message (if you have MyChart) OR A paper copy in the mail If you have any lab test that is abnormal or we need to change your treatment, we will call you to review the results.  Testing/Procedures: No test ordered today   Follow-Up: At [facility], you and your health needs are our priority.  As part of our continuing mission to provide you with exceptional heart care, our providers are all part of one team.  This team includes your primary Cardiologist (physician) and Advanced Practice Providers or APPs (Physician Assistants and Nurse Practitioners) who all work together to provide you with the care you need, when you need it.  Your next appointment:   6 month(s)  Provider:   Redell Cave, MD    We recommend signing up for the patient portal called MyChart.  Sign up information is provided on this After Visit Summary.  MyChart is used to connect with patients for Virtual Visits (Telemedicine).  Patients are able to view lab/test results, encounter notes, upcoming appointments, etc.  Non-urgent messages can be sent to your provider as well.   To learn more about what you can do with MyChart, go to [email or web address].
